# Patient Record
Sex: Female | Born: 1945
Health system: Southern US, Community
[De-identification: ages and names within clinical notes are randomized; demographics above are authoritative.]

## PROBLEM LIST (undated history)

## (undated) ENCOUNTER — Emergency Department (HOSPITAL_COMMUNITY)

## (undated) ENCOUNTER — Ambulatory Visit (HOSPITAL_COMMUNITY): Admission: EM | Payer: Medicare Other | Source: Home / Self Care

## (undated) DIAGNOSIS — M810 Age-related osteoporosis without current pathological fracture: Secondary | ICD-10-CM

## (undated) DIAGNOSIS — T4145XA Adverse effect of unspecified anesthetic, initial encounter: Secondary | ICD-10-CM

## (undated) DIAGNOSIS — C801 Malignant (primary) neoplasm, unspecified: Secondary | ICD-10-CM

## (undated) DIAGNOSIS — G4730 Sleep apnea, unspecified: Secondary | ICD-10-CM

## (undated) DIAGNOSIS — M1990 Unspecified osteoarthritis, unspecified site: Secondary | ICD-10-CM

## (undated) DIAGNOSIS — H409 Unspecified glaucoma: Secondary | ICD-10-CM

## (undated) DIAGNOSIS — A6000 Herpesviral infection of urogenital system, unspecified: Secondary | ICD-10-CM

## (undated) DIAGNOSIS — I10 Essential (primary) hypertension: Secondary | ICD-10-CM

## (undated) DIAGNOSIS — T8859XA Other complications of anesthesia, initial encounter: Secondary | ICD-10-CM

## (undated) DIAGNOSIS — I2510 Atherosclerotic heart disease of native coronary artery without angina pectoris: Secondary | ICD-10-CM

## (undated) DIAGNOSIS — H9190 Unspecified hearing loss, unspecified ear: Secondary | ICD-10-CM

## (undated) HISTORY — PX: MOUTH SURGERY: SHX715

## (undated) HISTORY — PX: OTHER SURGICAL HISTORY: SHX169

## (undated) HISTORY — DX: Sleep apnea, unspecified: G47.30

## (undated) HISTORY — PX: TUBAL LIGATION: SHX77

---
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
## 1998-06-09 ENCOUNTER — Other Ambulatory Visit: Admission: RE | Admit: 1998-06-09 | Discharge: 1998-06-09 | Payer: Self-pay | Admitting: Family Medicine

## 1998-10-21 ENCOUNTER — Encounter: Payer: Self-pay | Admitting: Family Medicine

## 1998-10-21 ENCOUNTER — Ambulatory Visit (HOSPITAL_COMMUNITY): Admission: RE | Admit: 1998-10-21 | Discharge: 1998-10-21 | Payer: Self-pay | Admitting: Family Medicine

## 1999-01-31 ENCOUNTER — Encounter: Payer: Self-pay | Admitting: Emergency Medicine

## 1999-01-31 ENCOUNTER — Inpatient Hospital Stay (HOSPITAL_COMMUNITY): Admission: EM | Admit: 1999-01-31 | Discharge: 1999-02-04 | Payer: Self-pay | Admitting: Emergency Medicine

## 1999-02-03 ENCOUNTER — Encounter: Payer: Self-pay | Admitting: Cardiovascular Disease

## 1999-02-11 ENCOUNTER — Emergency Department (HOSPITAL_COMMUNITY): Admission: EM | Admit: 1999-02-11 | Discharge: 1999-02-11 | Payer: Self-pay | Admitting: Emergency Medicine

## 1999-02-12 ENCOUNTER — Emergency Department (HOSPITAL_COMMUNITY): Admission: EM | Admit: 1999-02-12 | Discharge: 1999-02-12 | Payer: Self-pay | Admitting: Emergency Medicine

## 1999-02-20 ENCOUNTER — Emergency Department (HOSPITAL_COMMUNITY): Admission: EM | Admit: 1999-02-20 | Discharge: 1999-02-20 | Payer: Self-pay | Admitting: Emergency Medicine

## 2000-05-07 ENCOUNTER — Emergency Department (HOSPITAL_COMMUNITY): Admission: EM | Admit: 2000-05-07 | Discharge: 2000-05-07 | Payer: Self-pay | Admitting: Emergency Medicine

## 2000-05-14 ENCOUNTER — Emergency Department (HOSPITAL_COMMUNITY): Admission: EM | Admit: 2000-05-14 | Discharge: 2000-05-14 | Payer: Self-pay | Admitting: Emergency Medicine

## 2000-06-16 ENCOUNTER — Ambulatory Visit (HOSPITAL_COMMUNITY): Admission: RE | Admit: 2000-06-16 | Discharge: 2000-06-16 | Payer: Self-pay | Admitting: Family Medicine

## 2000-06-16 ENCOUNTER — Encounter: Payer: Self-pay | Admitting: Family Medicine

## 2000-06-25 ENCOUNTER — Emergency Department (HOSPITAL_COMMUNITY): Admission: EM | Admit: 2000-06-25 | Discharge: 2000-06-25 | Payer: Self-pay

## 2000-09-16 ENCOUNTER — Encounter: Payer: Self-pay | Admitting: Family Medicine

## 2000-09-16 ENCOUNTER — Ambulatory Visit (HOSPITAL_COMMUNITY): Admission: RE | Admit: 2000-09-16 | Discharge: 2000-09-16 | Payer: Self-pay | Admitting: Family Medicine

## 2001-09-15 ENCOUNTER — Other Ambulatory Visit: Admission: RE | Admit: 2001-09-15 | Discharge: 2001-09-15 | Payer: Self-pay | Admitting: Anesthesiology

## 2001-09-15 ENCOUNTER — Other Ambulatory Visit: Admission: RE | Admit: 2001-09-15 | Discharge: 2001-09-15 | Payer: Self-pay | Admitting: Family Medicine

## 2005-01-18 ENCOUNTER — Ambulatory Visit (HOSPITAL_COMMUNITY): Admission: RE | Admit: 2005-01-18 | Discharge: 2005-01-18 | Payer: Self-pay | Admitting: Family Medicine

## 2009-07-18 ENCOUNTER — Encounter: Admission: RE | Admit: 2009-07-18 | Discharge: 2009-07-18 | Payer: Self-pay | Admitting: Family Medicine

## 2009-07-25 ENCOUNTER — Encounter: Admission: RE | Admit: 2009-07-25 | Discharge: 2009-07-25 | Payer: Self-pay | Admitting: Family Medicine

## 2009-10-19 ENCOUNTER — Emergency Department (HOSPITAL_COMMUNITY): Admission: EM | Admit: 2009-10-19 | Discharge: 2009-10-19 | Payer: Self-pay | Admitting: Family Medicine

## 2010-02-21 ENCOUNTER — Emergency Department (HOSPITAL_COMMUNITY): Admission: EM | Admit: 2010-02-21 | Discharge: 2010-02-21 | Payer: Self-pay | Admitting: Emergency Medicine

## 2010-09-07 ENCOUNTER — Other Ambulatory Visit (HOSPITAL_COMMUNITY): Payer: Self-pay | Admitting: Family Medicine

## 2010-09-07 DIAGNOSIS — Z1231 Encounter for screening mammogram for malignant neoplasm of breast: Secondary | ICD-10-CM

## 2010-09-07 DIAGNOSIS — Z139 Encounter for screening, unspecified: Secondary | ICD-10-CM

## 2010-09-25 ENCOUNTER — Ambulatory Visit (HOSPITAL_COMMUNITY)
Admission: RE | Admit: 2010-09-25 | Discharge: 2010-09-25 | Disposition: A | Discharge status: Home / Self Care | Payer: Medicare Other | Source: Ambulatory Visit | Attending: Family Medicine | Admitting: Family Medicine

## 2010-09-25 DIAGNOSIS — Z1231 Encounter for screening mammogram for malignant neoplasm of breast: Secondary | ICD-10-CM | POA: Insufficient documentation

## 2011-05-15 ENCOUNTER — Emergency Department (HOSPITAL_COMMUNITY)
Admission: EM | Admit: 2011-05-15 | Discharge: 2011-05-15 | Disposition: A | Discharge status: Home / Self Care | Payer: Medicare Other | Attending: Emergency Medicine | Admitting: Emergency Medicine

## 2011-05-15 DIAGNOSIS — R220 Localized swelling, mass and lump, head: Secondary | ICD-10-CM | POA: Insufficient documentation

## 2011-05-15 DIAGNOSIS — R0989 Other specified symptoms and signs involving the circulatory and respiratory systems: Secondary | ICD-10-CM | POA: Insufficient documentation

## 2011-05-15 DIAGNOSIS — R0602 Shortness of breath: Secondary | ICD-10-CM | POA: Insufficient documentation

## 2011-05-15 DIAGNOSIS — I519 Heart disease, unspecified: Secondary | ICD-10-CM | POA: Insufficient documentation

## 2011-05-15 DIAGNOSIS — I10 Essential (primary) hypertension: Secondary | ICD-10-CM | POA: Insufficient documentation

## 2011-05-15 DIAGNOSIS — T781XXA Other adverse food reactions, not elsewhere classified, initial encounter: Secondary | ICD-10-CM | POA: Insufficient documentation

## 2011-05-15 DIAGNOSIS — H409 Unspecified glaucoma: Secondary | ICD-10-CM | POA: Insufficient documentation

## 2011-05-15 DIAGNOSIS — Z79899 Other long term (current) drug therapy: Secondary | ICD-10-CM | POA: Insufficient documentation

## 2011-05-15 DIAGNOSIS — R0609 Other forms of dyspnea: Secondary | ICD-10-CM | POA: Insufficient documentation

## 2012-01-21 ENCOUNTER — Other Ambulatory Visit (HOSPITAL_COMMUNITY): Payer: Self-pay | Admitting: Obstetrics and Gynecology

## 2012-01-21 DIAGNOSIS — Z780 Asymptomatic menopausal state: Secondary | ICD-10-CM

## 2012-01-21 DIAGNOSIS — Z1231 Encounter for screening mammogram for malignant neoplasm of breast: Secondary | ICD-10-CM

## 2012-04-06 ENCOUNTER — Ambulatory Visit (HOSPITAL_COMMUNITY)
Admission: RE | Admit: 2012-04-06 | Discharge: 2012-04-06 | Disposition: A | Discharge status: Home / Self Care | Payer: Medicare Other | Source: Ambulatory Visit | Attending: Obstetrics and Gynecology | Admitting: Obstetrics and Gynecology

## 2012-04-06 DIAGNOSIS — Z780 Asymptomatic menopausal state: Secondary | ICD-10-CM | POA: Insufficient documentation

## 2012-04-06 DIAGNOSIS — Z1231 Encounter for screening mammogram for malignant neoplasm of breast: Secondary | ICD-10-CM | POA: Insufficient documentation

## 2012-04-06 DIAGNOSIS — Z13820 Encounter for screening for osteoporosis: Secondary | ICD-10-CM | POA: Insufficient documentation

## 2012-11-01 ENCOUNTER — Emergency Department (INDEPENDENT_AMBULATORY_CARE_PROVIDER_SITE_OTHER): Payer: Medicare Other

## 2012-11-01 ENCOUNTER — Encounter (HOSPITAL_COMMUNITY): Payer: Self-pay | Admitting: Emergency Medicine

## 2012-11-01 ENCOUNTER — Emergency Department (INDEPENDENT_AMBULATORY_CARE_PROVIDER_SITE_OTHER)
Admission: EM | Admit: 2012-11-01 | Discharge: 2012-11-01 | Disposition: A | Discharge status: Home / Self Care | Payer: Medicare Other | Source: Home / Self Care | Attending: Family Medicine | Admitting: Family Medicine

## 2012-11-01 DIAGNOSIS — R109 Unspecified abdominal pain: Secondary | ICD-10-CM

## 2012-11-01 DIAGNOSIS — S76219A Strain of adductor muscle, fascia and tendon of unspecified thigh, initial encounter: Secondary | ICD-10-CM

## 2012-11-01 DIAGNOSIS — IMO0002 Reserved for concepts with insufficient information to code with codable children: Secondary | ICD-10-CM

## 2012-11-01 DIAGNOSIS — R1032 Left lower quadrant pain: Secondary | ICD-10-CM

## 2012-11-01 HISTORY — DX: Essential (primary) hypertension: I10

## 2012-11-01 HISTORY — DX: Unspecified glaucoma: H40.9

## 2012-11-01 HISTORY — DX: Herpesviral infection of urogenital system, unspecified: A60.00

## 2012-11-01 LAB — POCT URINALYSIS DIP (DEVICE)
Hgb urine dipstick: NEGATIVE
Nitrite: NEGATIVE
Protein, ur: NEGATIVE mg/dL
Urobilinogen, UA: 0.2 mg/dL (ref 0.0–1.0)
pH: 7.5 (ref 5.0–8.0)

## 2012-11-01 MED ORDER — VALACYCLOVIR HCL 500 MG PO TABS: 500.0000 mg | ORAL_TABLET | Freq: Two times a day (BID) | ORAL | Status: AC

## 2012-11-01 MED ORDER — CYCLOBENZAPRINE HCL 10 MG PO TABS: 10.0000 mg | ORAL_TABLET | Freq: Every evening | ORAL | Status: DC | PRN

## 2012-11-01 MED ORDER — HYDROCODONE-ACETAMINOPHEN 5-325 MG PO TABS: 2.0000 | ORAL_TABLET | Freq: Three times a day (TID) | ORAL | Status: DC | PRN

## 2012-11-01 NOTE — ED Notes (Signed)
Woke with sharp pain "in side", left groin pain.  Denies urinary problems, denies bowel issues.  Pain onset Sunday am.  (3/16).  Pain is now dull.  Pain is not impacted by any behavior, pain is there and pain hurts

## 2012-11-01 NOTE — ED Notes (Signed)
Patient instructed to undress for physician exam, given patient gown and sheet

## 2012-11-01 NOTE — ED Notes (Signed)
Offered beverage, patient declined

## 2012-11-01 NOTE — ED Provider Notes (Signed)
History     CSN: [id number]  Arrival date & time 11/01/12  1013   First MD Initiated Contact with Patient 11/01/12 1025      Chief Complaint  Patient presents with  . Groin Pain    (Consider location/radiation/quality/duration/timing/severity/associated sxs/prior treatment) HPI Comments: 67 year old female with history of hypertension, glaucoma and genital herpes. Here complaining of left groin pain for 3 days. Patient states that the first time she felt pain was very urgent in the morning and woke her up. Pain is constant patient states that was sharp initially and now is dull and less. Patient denies abdominal pain nausea or vomiting associated. She has been having some how "loose stools" once or twice a day with no blood or mucus for over a week and she attributes this as died changes. She explains she is a vegetarian and her diet has changed recently do to stress and "craving a lot of carbohydrates" has been eating a lot of punkin bread and soups during the last few weeks. Appetite has not changed.  Patient also reports history of genital herpes that started 3 years ago and states that she had recurrent genital herpes outbreaks for 2 months that improved after she started taking oral lysine once a day last month and has not had another outbreak. She denies having a current genital rash. Denies fever or chills. No headache or dizziness. She is otherwise feeling well. Patient also states she slipped in the ice and fell sitting on her back the day before her pain started. She didn't think she was injured as did not experience pain right after her fall.    Past Medical History  Diagnosis Date  . Hypertension   . Glaucoma   . Genital herpes     Past Surgical History  Procedure Laterality Date  . Cesarean section    . Coronary catherization      No family history on file.  History  Substance Use Topics  . Smoking status: Never Smoker   . Smokeless tobacco: Not on file  . Alcohol  Use: No    OB History   Grav Para Term Preterm Abortions TAB SAB Ect Mult Living                  Review of Systems  Constitutional: Negative for fever, chills, activity change, appetite change and fatigue.  Gastrointestinal: Negative for nausea, vomiting, abdominal pain and constipation.  Genitourinary: Negative for dysuria, hematuria and flank pain.  Skin: Negative for rash.  All other systems reviewed and are negative.    Allergies  Fish allergy; Latex; Lodine; Milk-related compounds; Norvasc; Penicillins; and Sulfur  Home Medications   Current Outpatient Rx  Name  Route  Sig  Dispense  Refill  . aspirin 81 MG tablet   Oral   Take 81 mg by mouth daily.         . bimatoprost (LUMIGAN) 0.01 % SOLN      1 drop at bedtime.         . brimonidine (ALPHAGAN P) 0.1 % SOLN               . ibuprofen (ADVIL,MOTRIN) 200 MG tablet   Oral   Take 200 mg by mouth every 6 (six) hours as needed for pain.         Marland Kitchen OVER THE COUNTER MEDICATION      lysene?         . triamterene-hydrochlorothiazide (MAXZIDE-25) 37.5-25 MG per tablet   Oral  Take 1 tablet by mouth daily.         . cyclobenzaprine (FLEXERIL) 10 MG tablet   Oral   Take 1 tablet (10 mg total) by mouth at bedtime as needed for muscle spasms.   20 tablet   0   . HYDROcodone-acetaminophen (NORCO/VICODIN) 5-325 MG per tablet   Oral   Take 2 tablets by mouth every 8 (eight) hours as needed for pain.   10 tablet   0   . valACYclovir (VALTREX) 500 MG tablet   Oral   Take 1 tablet (500 mg total) by mouth 2 (two) times daily. For 3 days   6 tablet   2     BP 152/65  Pulse 60  Temp(Src) 97.7 F (36.5 C) (Oral)  Resp 18  SpO2 100%  Physical Exam  Nursing note and vitals reviewed. Constitutional: She is oriented to person, place, and time. She appears well-developed and well-nourished. No distress.  HENT:  Head: Normocephalic and atraumatic.  Cardiovascular: Normal heart sounds.    Pulmonary/Chest: Breath sounds normal.  Abdominal: Soft. Bowel sounds are normal. She exhibits no distension and no mass. There is no tenderness. There is no rebound and no guarding.  Genitourinary:  There is tenderness to deep palpation in the left groin area. No mass or obvious large lymphadenopathy. No obvious femoral hernia. Femoral pulses normal.  Musculoskeletal:  Central spine with no scoliosis or kyphosis. Fair anterior flexion and posterior extension. Patient able to walk on tip toes and heels with no difficulty foot drop or pain exacerbation. No bone prominence tenderness. Tenderness to palpation in bilateral lumbar paravertebral muscles.  Negative straight leg test bilateral. Intact sensation and symmetric + DTRs (rotullian and achillean) in low extremities. Left hip: Fair external and internal rotation. No erythema or swelling. Reported pain with movement. Patient is weightbearing with no limp.   Lymphadenopathy:    She has no cervical adenopathy.  Neurological: She is alert and oriented to person, place, and time.  Skin: No rash noted. She is not diaphoretic.    ED Course  Procedures (including critical care time)  Labs Reviewed  POCT URINALYSIS DIP (DEVICE)   Dg Hip Complete Left  11/01/2012  *RADIOLOGY REPORT*  Clinical Data: Fall.  Left hip pain.  LEFT HIP - COMPLETE 2+ VIEW  Comparison: None.  Findings: There is no fracture or dislocation.  Joint spaces are preserved.  No plain film evidence of avascular necrosis. Surrounding osseous and soft tissue structures are unremarkable.  IMPRESSION: Negative exam.   Original Report Authenticated By: Holley Dexter, M.D.    Dg Abd 1 View  11/01/2012  *RADIOLOGY REPORT*  Clinical Data: Left lower quadrant abdominal pain.  Groin pain.  ABDOMEN - 1 VIEW  Comparison: No priors.  Findings: Gas and stool are seen scattered throughout the colon extending to the level of the distal rectum.  No pathologic distension of small bowel is  noted.  No gross evidence of pneumoperitoneum.  Numerous pelvic phleboliths are incidentally noted.  IMPRESSION: 1.  Nonobstructive bowel gas pattern. 2.  No pneumoperitoneum.   Original Report Authenticated By: Trudie Reed, M.D.      1. Deep groin pain, left   2. Back strain, initial encounter   3. Groin strain, initial encounter       MDM  67 year old female with history of genital herpes and frequent recurrences recently. Here complaining of left groin pain. Recent fall. Normal examination other than reported pain with deep palpation in the left groin. No  clinical findings or radiologic findings suggestive of bowel obstruction. No significant gastrointestinal symptoms No obvious femoral hernia or findings suggestive of acute abdomen. No hip fractures on x-rays. My impression is that groin pain is likely related to deep tender lymph nodes in relation to herpes. Prescribed Valtrex to use as needed. Also low back strain. Prescribed Flexeril and Norco. Supportive care and red flags should prompt her return to medical attention discussed with patient and provided in writing.        Sharin Grave, MD 11/02/12 1754

## 2013-02-22 ENCOUNTER — Encounter (HOSPITAL_COMMUNITY): Payer: Self-pay | Admitting: Emergency Medicine

## 2013-02-22 ENCOUNTER — Other Ambulatory Visit: Payer: Self-pay

## 2013-02-22 ENCOUNTER — Emergency Department (INDEPENDENT_AMBULATORY_CARE_PROVIDER_SITE_OTHER)
Admission: EM | Admit: 2013-02-22 | Discharge: 2013-02-22 | Disposition: A | Discharge status: Home / Self Care | Payer: Medicare Other | Source: Home / Self Care

## 2013-02-22 DIAGNOSIS — R200 Anesthesia of skin: Secondary | ICD-10-CM

## 2013-02-22 DIAGNOSIS — E871 Hypo-osmolality and hyponatremia: Secondary | ICD-10-CM

## 2013-02-22 DIAGNOSIS — E860 Dehydration: Secondary | ICD-10-CM

## 2013-02-22 LAB — POCT I-STAT, CHEM 8
Glucose, Bld: 97 mg/dL (ref 70–99)
HCT: 36 % (ref 36.0–46.0)
Hemoglobin: 12.2 g/dL (ref 12.0–15.0)
Potassium: 3.8 mEq/L (ref 3.5–5.1)
Sodium: 130 mEq/L — ABNORMAL LOW (ref 135–145)
TCO2: 26 mmol/L (ref 0–100)

## 2013-02-22 LAB — URINALYSIS, ROUTINE W REFLEX MICROSCOPIC
Glucose, UA: NEGATIVE mg/dL
Hgb urine dipstick: NEGATIVE
Ketones, ur: NEGATIVE mg/dL
Protein, ur: NEGATIVE mg/dL
Urobilinogen, UA: 0.2 mg/dL (ref 0.0–1.0)

## 2013-02-22 LAB — URINE MICROSCOPIC-ADD ON

## 2013-02-22 LAB — CBC WITH DIFFERENTIAL/PLATELET
Basophils Relative: 0 % (ref 0–1)
Eosinophils Absolute: 0.1 10*3/uL (ref 0.0–0.7)
MCH: 30.1 pg (ref 26.0–34.0)
MCHC: 34.3 g/dL (ref 30.0–36.0)
Neutrophils Relative %: 60 % (ref 43–77)
Platelets: 190 10*3/uL (ref 150–400)

## 2013-02-22 LAB — POCT URINALYSIS DIP (DEVICE)
Bilirubin Urine: NEGATIVE
Ketones, ur: NEGATIVE mg/dL
Nitrite: NEGATIVE
Protein, ur: NEGATIVE mg/dL

## 2013-02-22 MED ORDER — SODIUM CHLORIDE 0.9 % IV BOLUS (SEPSIS)
1000.0000 mL | Freq: Once | INTRAVENOUS | Status: AC
  Administered 2013-02-22: 1000 mL via INTRAVENOUS

## 2013-02-22 MED ORDER — ONDANSETRON 4 MG PO TBDP
ORAL_TABLET | ORAL | Status: AC
  Filled 2013-02-22: qty 1

## 2013-02-22 MED ORDER — ONDANSETRON 4 MG PO TBDP
4.0000 mg | ORAL_TABLET | Freq: Once | ORAL | Status: AC
  Administered 2013-02-22: 4 mg via ORAL

## 2013-02-22 NOTE — ED Notes (Addendum)
Patient c/o leg cramping onset this morning in her calf muscle. Woke her up from sleeping. Pain was very intense, she tried drinking large cups of water with no relief. Felt dizzy and like her heart was racing. Pt also c/o right forearm swelling due to a possible bug bite. Felt nauseous this morning. Also has a headache now.  Patient is fasting for Ramadan, for about 16 hrs a day. Patient is alert and oriented.

## 2013-02-22 NOTE — ED Provider Notes (Signed)
History    CSN: [id number] Arrival date & time 02/22/13  1002  None    Chief Complaint  Patient presents with  . Leg Pain  . Insect Bite   (Consider location/radiation/quality/duration/timing/severity/associated sxs/prior Treatment) HPI Comments: 67 year old female presents complaining of leg cramping, dizziness, palpitations, nausea, headache. This began this morning at about 2 AM with the cramping in her left calf. She has had cramping before exactly like this but it usually goes away after she drinks some water. She has been drinking a lot of water in the calf cramp has somewhat subsided but he keeps feeling like it's going to come back. The palpitations have been happening frequently since about 2:00 this morning as well. She has had fluttering in her chest similar to this before but never this frequently. She does not have any symptoms associated with a fluttering including change in her dizziness, shortness of breath, chest pain, nausea, and diaphoresis. The dizziness she describes as a slight lightheaded feeling but has been happening since about 7:00 this morning. It is not associated with any change in temperature and did not begin with any exertion. She occasionally has symptoms similar to this with sinus irritation but she is not currently experiencing any sinus problems. The nausea and headache she felt for just a couple of minutes this morning, these have now both resolved. She also had a area of swelling on her right distal medial forearm just proximal to the wrist where she thinks she may have been bitten by a bug, but this is now resolving without any treatment. She notes that she is now fasting for 16 hours per day for Ramadan and she was out in the yard a lot yesterday and feels like she may be dehydrated.  Patient is a 67 y.o. female presenting with leg pain.  Leg Pain Associated symptoms: no fever and no neck pain    Past Medical History  Diagnosis Date  . Hypertension   .  Glaucoma   . Genital herpes    Past Surgical History  Procedure Laterality Date  . Cesarean section    . Coronary catherization     No family history on file. History  Substance Use Topics  . Smoking status: Never Smoker   . Smokeless tobacco: Not on file  . Alcohol Use: No   OB History   Grav Para Term Preterm Abortions TAB SAB Ect Mult Living                 Review of Systems  Constitutional: Negative for fever and chills.  HENT: Negative for ear pain, congestion, sore throat, facial swelling, rhinorrhea, neck pain, postnasal drip and sinus pressure.   Eyes: Negative for visual disturbance.  Respiratory: Negative for cough and shortness of breath.   Cardiovascular: Positive for palpitations. Negative for chest pain and leg swelling.  Gastrointestinal: Positive for nausea. Negative for vomiting and abdominal pain.  Endocrine: Negative for polydipsia and polyuria.  Genitourinary: Negative for dysuria, urgency and frequency.  Musculoskeletal: Positive for myalgias (cramping - see HPI). Negative for arthralgias.  Skin: Negative for rash.  Neurological: Positive for light-headedness and headaches. Negative for dizziness and weakness.    Allergies  Fish allergy; Latex; Lodine; Milk-related compounds; Norvasc; Penicillins; and Sulfur  Home Medications   Current Outpatient Rx  Name  Route  Sig  Dispense  Refill  . bimatoprost (LUMIGAN) 0.01 % SOLN      1 drop at bedtime.         Marland Kitchen  brimonidine (ALPHAGAN P) 0.1 % SOLN               . OVER THE COUNTER MEDICATION      lysene?         . triamterene-hydrochlorothiazide (MAXZIDE-25) 37.5-25 MG per tablet   Oral   Take 1 tablet by mouth daily.         Marland Kitchen aspirin 81 MG tablet   Oral   Take 81 mg by mouth daily.         . cyclobenzaprine (FLEXERIL) 10 MG tablet   Oral   Take 1 tablet (10 mg total) by mouth at bedtime as needed for muscle spasms.   20 tablet   0   . HYDROcodone-acetaminophen (NORCO/VICODIN)  5-325 MG per tablet   Oral   Take 2 tablets by mouth every 8 (eight) hours as needed for pain.   10 tablet   0   . ibuprofen (ADVIL,MOTRIN) 200 MG tablet   Oral   Take 200 mg by mouth every 6 (six) hours as needed for pain.          BP 163/79  Pulse 56  Temp(Src) 98.6 F (37 C) (Oral)  Resp 14  SpO2 100% Physical Exam  Nursing note and vitals reviewed. Constitutional: She is oriented to person, place, and time. Vital signs are normal. She appears well-developed and well-nourished. No distress.  HENT:  Head: Atraumatic.  Eyes: EOM are normal. Pupils are equal, round, and reactive to light.  Cardiovascular: Normal rate, regular rhythm, normal heart sounds, intact distal pulses and normal pulses.   No extrasystoles are present. Exam reveals no gallop and no friction rub.   No murmur heard. Negative thompson test, pain with passive dorsiflexion, edema, or decreased pedal pulses   Pulmonary/Chest: Effort normal and breath sounds normal. No respiratory distress. She has no wheezes. She has no rales.  Abdominal: Soft. There is no tenderness.  Musculoskeletal:       Right forearm: She exhibits swelling (at distal ulnar side of her arm, no induration, no tenderness, no swelling in the arm distal to this.).       Left lower leg: She exhibits no tenderness, no bony tenderness, no swelling, no edema and no deformity.  Neurological: She is alert and oriented to person, place, and time. She has normal strength.  Skin: Skin is warm and dry. She is not diaphoretic.  Psychiatric: She has a normal mood and affect. Her behavior is normal. Judgment normal.    ED Course  Procedures (including critical care time) Labs Reviewed  CBC WITH DIFFERENTIAL - Abnormal; Notable for the following:    RBC 3.75 (*)    Hemoglobin 11.3 (*)    HCT 32.9 (*)    All other components within normal limits  URINALYSIS, ROUTINE W REFLEX MICROSCOPIC - Abnormal; Notable for the following:    Leukocytes, UA SMALL  (*)    All other components within normal limits  POCT I-STAT, CHEM 8 - Abnormal; Notable for the following:    Sodium 130 (*)    Chloride 93 (*)    All other components within normal limits  POCT URINALYSIS DIP (DEVICE) - Abnormal; Notable for the following:    Hgb urine dipstick TRACE (*)    Leukocytes, UA TRACE (*)    All other components within normal limits  URINE CULTURE  URINE MICROSCOPIC-ADD ON   No results found. 1. Dehydration   2. Hyponatremia     EKG is normal. She has mild  anemia, also mild hyponatremia and hypochloremia. Other than the anemia, this is a picture of dehydration. With hemoconcentration, the anemia is probably more pronounced than it seems to be today with the CBC.  Will give 1 liter bolus of 0.9% NaCl and reassess her at that point.    After 1 L of normal saline, she feels much better and all symptoms have resolved. MDM  All of the symptoms have most likely been brought on by dehydration. I urged her to stop fasting throughout the day to maintain a normal oral intake of fluids. If her symptoms return or she experiences any new symptoms she will followup with her primary care physician, here, or in the emergency department.   Meds ordered this encounter  Medications  . sodium chloride 0.9 % bolus 1,000 mL    Sig:   . ondansetron (ZOFRAN-ODT) disintegrating tablet 4 mg    Sig:      Graylon Good, PA-C 02/22/13 2104

## 2013-02-23 NOTE — ED Provider Notes (Signed)
Medical screening examination/treatment/procedure(s) were performed by non-physician practitioner and as supervising physician I was immediately available for consultation/collaboration.   [facility], [place]; MD  Sharin Grave, MD 02/23/13 [phone]

## 2013-02-24 LAB — URINE CULTURE: Colony Count: 40000

## 2013-03-09 ENCOUNTER — Inpatient Hospital Stay (HOSPITAL_COMMUNITY)
Admission: EM | Admit: 2013-03-09 | Discharge: 2013-03-11 | DRG: 103 | Disposition: A | Discharge status: Home / Self Care | Payer: Medicare Other | Attending: Internal Medicine | Admitting: Internal Medicine

## 2013-03-09 ENCOUNTER — Emergency Department (HOSPITAL_COMMUNITY): Payer: Medicare Other

## 2013-03-09 ENCOUNTER — Encounter (HOSPITAL_COMMUNITY): Payer: Self-pay

## 2013-03-09 DIAGNOSIS — I6350 Cerebral infarction due to unspecified occlusion or stenosis of unspecified cerebral artery: Secondary | ICD-10-CM

## 2013-03-09 DIAGNOSIS — I639 Cerebral infarction, unspecified: Secondary | ICD-10-CM

## 2013-03-09 DIAGNOSIS — E876 Hypokalemia: Secondary | ICD-10-CM | POA: Diagnosis present

## 2013-03-09 DIAGNOSIS — I10 Essential (primary) hypertension: Secondary | ICD-10-CM | POA: Diagnosis present

## 2013-03-09 DIAGNOSIS — R079 Chest pain, unspecified: Secondary | ICD-10-CM | POA: Diagnosis present

## 2013-03-09 DIAGNOSIS — R209 Unspecified disturbances of skin sensation: Secondary | ICD-10-CM | POA: Diagnosis present

## 2013-03-09 DIAGNOSIS — Z79899 Other long term (current) drug therapy: Secondary | ICD-10-CM

## 2013-03-09 DIAGNOSIS — R51 Headache: Principal | ICD-10-CM | POA: Diagnosis present

## 2013-03-09 DIAGNOSIS — Z23 Encounter for immunization: Secondary | ICD-10-CM

## 2013-03-09 DIAGNOSIS — Z7982 Long term (current) use of aspirin: Secondary | ICD-10-CM

## 2013-03-09 DIAGNOSIS — R200 Anesthesia of skin: Secondary | ICD-10-CM | POA: Diagnosis present

## 2013-03-09 DIAGNOSIS — H409 Unspecified glaucoma: Secondary | ICD-10-CM | POA: Diagnosis present

## 2013-03-09 LAB — POCT I-STAT, CHEM 8
Glucose, Bld: 96 mg/dL (ref 70–99)
HCT: 35 % — ABNORMAL LOW (ref 36.0–46.0)
Hemoglobin: 11.9 g/dL — ABNORMAL LOW (ref 12.0–15.0)
Potassium: 3.1 mEq/L — ABNORMAL LOW (ref 3.5–5.1)
Sodium: 133 mEq/L — ABNORMAL LOW (ref 135–145)

## 2013-03-09 LAB — CBC WITH DIFFERENTIAL/PLATELET
Basophils Absolute: 0 10*3/uL (ref 0.0–0.1)
Basophils Relative: 1 % (ref 0–1)
Eosinophils Absolute: 0.1 10*3/uL (ref 0.0–0.7)
MCH: 29.9 pg (ref 26.0–34.0)
MCHC: 33.5 g/dL (ref 30.0–36.0)
Neutrophils Relative %: 60 % (ref 43–77)
Platelets: 230 10*3/uL (ref 150–400)
RDW: 14.1 % (ref 11.5–15.5)

## 2013-03-09 MED ORDER — ASPIRIN 81 MG PO CHEW
324.0000 mg | CHEWABLE_TABLET | Freq: Once | ORAL | Status: AC
  Administered 2013-03-10: 324 mg via ORAL
  Filled 2013-03-09: qty 4

## 2013-03-09 MED ORDER — SODIUM CHLORIDE 0.9 % IV BOLUS (SEPSIS)
500.0000 mL | Freq: Once | INTRAVENOUS | Status: AC
  Administered 2013-03-09: 500 mL via INTRAVENOUS

## 2013-03-09 NOTE — ED Notes (Signed)
MD at bedside. 

## 2013-03-09 NOTE — ED Provider Notes (Addendum)
CSN: [id number]     Arrival date & time 03/09/13  1943 History     First MD Initiated Contact with Patient 03/09/13 2005     Chief Complaint  Patient presents with  . Leg Numbness    (Consider location/radiation/quality/duration/timing/severity/associated sxs/prior Treatment) HPI Comments: Patient presents with headache and left leg numbness. She states that about 2 hours ago she had onset of a right-sided headache that spread to the left side. This was associated with numbness in her left foot this spread to her left leg. She denies any numbness or weakness of her arm. She denies any weakness of her legs. She did have some associated dizziness and confusion with the symptoms. She states that she forgot how to drive to the hospital. She subsequently called EMS and was brought in by EMS. She denies any slurred speech or facial numbness. She denies any vision changes. She denies any history of stroke symptoms in the past. She has had some intermittent dizziness in the past but states this is different. She did have an episode of chest pain to the left side of her chest earlier this afternoon. There is no associated shortness of breath. She's had similar episodes of chest pain intermittently over the last several months.   Past Medical History  Diagnosis Date  . Hypertension   . Glaucoma   . Genital herpes    Past Surgical History  Procedure Laterality Date  . Cesarean section    . Coronary catherization     History reviewed. No pertinent family history. History  Substance Use Topics  . Smoking status: Never Smoker   . Smokeless tobacco: Not on file  . Alcohol Use: No   OB History   Grav Para Term Preterm Abortions TAB SAB Ect Mult Living                 Review of Systems  Constitutional: Negative for fever, chills, diaphoresis and fatigue.  HENT: Negative for congestion, rhinorrhea and sneezing.   Eyes: Negative.   Respiratory: Negative for cough, chest tightness and shortness  of breath.   Cardiovascular: Positive for chest pain. Negative for leg swelling.  Gastrointestinal: Negative for nausea, vomiting, abdominal pain, diarrhea and blood in stool.  Genitourinary: Negative for frequency, hematuria, flank pain and difficulty urinating.  Musculoskeletal: Negative for back pain and arthralgias.  Skin: Negative for rash.  Neurological: Positive for dizziness, numbness and headaches. Negative for tremors, speech difficulty and weakness.    Allergies  Fish allergy; Latex; Lodine; Milk-related compounds; Norvasc; Penicillins; and Sulfur  Home Medications   Current Outpatient Rx  Name  Route  Sig  Dispense  Refill  . brimonidine (ALPHAGAN) 0.2 % ophthalmic solution   Both Eyes   Place 1 drop into both eyes 3 (three) times daily.         . calcium carbonate (OS-CAL) 1250 MG chewable tablet   Oral   Chew 2 tablets by mouth 2 (two) times daily.         . CHELATED IRON PO   Oral   Take 1 tablet by mouth daily.         Marland Kitchen latanoprost (XALATAN) 0.005 % ophthalmic solution   Both Eyes   Place 1 drop into both eyes at bedtime.         . triamterene-hydrochlorothiazide (MAXZIDE-25) 37.5-25 MG per tablet   Oral   Take 1 tablet by mouth daily.         . Vitamin D, Ergocalciferol, (DRISDOL) 50000  UNITS CAPS   Oral   Take 5,000 Units by mouth daily.          BP 151/81  Pulse 61  Temp(Src) 98 F (36.7 C) (Oral)  Resp 20  SpO2 95% Physical Exam  Constitutional: She is oriented to person, place, and time. She appears well-developed and well-nourished.  HENT:  Head: Normocephalic and atraumatic.  Eyes: Pupils are equal, round, and reactive to light.  Neck: Normal range of motion. Neck supple.  Cardiovascular: Normal rate, regular rhythm and normal heart sounds.   Pulmonary/Chest: Effort normal and breath sounds normal. No respiratory distress. She has no wheezes. She has no rales. She exhibits no tenderness.  Abdominal: Soft. Bowel sounds are  normal. There is no tenderness. There is no rebound and no guarding.  Musculoskeletal: Normal range of motion. She exhibits no edema.  Lymphadenopathy:    She has no cervical adenopathy.  Neurological: She is alert and oriented to person, place, and time. She has normal strength. A sensory deficit is present. No cranial nerve deficit. GCS eye subscore is 4. GCS verbal subscore is 5. GCS motor subscore is 6.  Mild numbness to light touch in the left foot. There is no numbness in the rest of the leg. Finger to nose intact.  Skin: Skin is warm and dry. No rash noted.  Psychiatric: She has a normal mood and affect.    ED Course   Procedures (including critical care time)  Results for orders placed during the hospital encounter of 03/09/13  CBC WITH DIFFERENTIAL      Result Value Range   WBC 5.2  4.0 - 10.5 K/uL   RBC 3.71 (*) 3.87 - 5.11 MIL/uL   Hemoglobin 11.1 (*) 12.0 - 15.0 g/dL   HCT 19.1 (*) 47.8 - 29.5 %   MCV 89.2  78.0 - 100.0 fL   MCH 29.9  26.0 - 34.0 pg   MCHC 33.5  30.0 - 36.0 g/dL   RDW 62.1  30.8 - 65.7 %   Platelets 230  150 - 400 K/uL   Neutrophils Relative % 60  43 - 77 %   Neutro Abs 3.1  1.7 - 7.7 K/uL   Lymphocytes Relative 29  12 - 46 %   Lymphs Abs 1.5  0.7 - 4.0 K/uL   Monocytes Relative 9  3 - 12 %   Monocytes Absolute 0.5  0.1 - 1.0 K/uL   Eosinophils Relative 1  0 - 5 %   Eosinophils Absolute 0.1  0.0 - 0.7 K/uL   Basophils Relative 1  0 - 1 %   Basophils Absolute 0.0  0.0 - 0.1 K/uL  POCT I-STAT, CHEM 8      Result Value Range   Sodium 133 (*) 135 - 145 mEq/L   Potassium 3.1 (*) 3.5 - 5.1 mEq/L   Chloride 95 (*) 96 - 112 mEq/L   BUN 4 (*) 6 - 23 mg/dL   Creatinine, Ser 8.46  0.50 - 1.10 mg/dL   Glucose, Bld 96  70 - 99 mg/dL   Calcium, Ion 9.62  9.52 - 1.30 mmol/L   TCO2 27  0 - 100 mmol/L   Hemoglobin 11.9 (*) 12.0 - 15.0 g/dL   HCT 84.1 (*) 32.4 - 40.1 %  POCT I-STAT TROPONIN I      Result Value Range   Troponin i, poc 0.01  0.00 - 0.08  ng/mL   Comment 3  Dg Chest 2 View  03/09/2013   *RADIOLOGY REPORT*  Clinical Data: Chest pain and cough.  CHEST - 2 VIEW  Comparison: No priors.  Findings: Lung volumes are normal.  No consolidative airspace disease.  No pleural effusions.  No pneumothorax.  No pulmonary nodule or mass noted.  Pulmonary vasculature and the cardiomediastinal silhouette are within normal limits. Atherosclerosis in the thoracic aorta.  IMPRESSION: 1. No radiographic evidence of acute cardiopulmonary disease. 2.  Atherosclerosis.   Original Report Authenticated By: Trudie Reed, M.D.   Ct Head Wo Contrast  03/09/2013   *RADIOLOGY REPORT*  Clinical Data: Headache.  Left foot numbness.  CT HEAD WITHOUT CONTRAST  Technique:  Contiguous axial images were obtained from the base of the skull through the vertex without contrast.  Comparison: No priors.  Findings: Patchy areas of ill-defined decreased attenuation throughout the deep and periventricular white matter of the cerebral hemispheres bilaterally likely reflect mild chronic microvascular ischemic disease. No acute intracranial abnormalities.  Specifically, no evidence of acute intracranial hemorrhage, no definite findings of acute/subacute cerebral ischemia, no mass, mass effect, hydrocephalus or abnormal intra or extra-axial fluid collections.  Visualized paranasal sinuses and mastoids are well pneumatized.  No acute displaced skull fractures are identified.  IMPRESSION: 1.  No acute intracranial abnormalities. 2.  Mild chronic microvascular ischemic changes in the cerebral white matter.   Original Report Authenticated By: Trudie Reed, M.D.    Date: 03/09/2013  Rate: 58  Rhythm: normal sinus rhythm  QRS Axis: normal  Intervals: normal  ST/T Wave abnormalities: normal  Conduction Disutrbances:none  Narrative Interpretation:   Old EKG Reviewed: none available    1. CVA (cerebral infarction)     MDM  Patient had a negative head CT. Were unable to  get an MRI here tonight. I consult with Dr. Amada Jupiter who came down to see the patient and recommended admission for further stroke evaluation. He requests patient be transferred to [facility] given that it's unclear whether an MRI can be done here over the weekend. I will consult with the hospitalist at [facility].  Discussed with Dr Julian Reil.    Rolan Bucco, MD 03/09/13 1610  Rolan Bucco, MD 03/09/13 2348

## 2013-03-09 NOTE — ED Notes (Signed)
RUE:[id number]<JW> Expected date:<BR> Expected time:<BR> Means of arrival:<BR> Comments:<BR> Ems/FEMALE-NUMBNESS LEFT LEG

## 2013-03-09 NOTE — ED Notes (Signed)
Per EMS, Sudden onset left leg numbness with headache and left arm numbness.  Headache and left arm have resolved.  Left leg continues numbness.  Also c/o chest discomfort and neck pain which has resolved.  EKG wnl.  Pt states has had numbness before with no dx. Hx MI, HTN 158/86, hr 84, resp 20, sats wnl.  18g Lt wrist

## 2013-03-10 ENCOUNTER — Inpatient Hospital Stay (HOSPITAL_COMMUNITY): Payer: Medicare Other

## 2013-03-10 ENCOUNTER — Encounter (HOSPITAL_COMMUNITY): Payer: Self-pay | Admitting: Internal Medicine

## 2013-03-10 DIAGNOSIS — R209 Unspecified disturbances of skin sensation: Secondary | ICD-10-CM

## 2013-03-10 DIAGNOSIS — I10 Essential (primary) hypertension: Secondary | ICD-10-CM | POA: Diagnosis present

## 2013-03-10 DIAGNOSIS — R079 Chest pain, unspecified: Secondary | ICD-10-CM | POA: Diagnosis present

## 2013-03-10 DIAGNOSIS — R200 Anesthesia of skin: Secondary | ICD-10-CM | POA: Diagnosis present

## 2013-03-10 LAB — TROPONIN I: Troponin I: <0.3 ng/mL (ref <0.30)

## 2013-03-10 MED ORDER — PNEUMOCOCCAL VAC POLYVALENT 25 MCG/0.5ML IJ INJ
0.5000 mL | INJECTION | INTRAMUSCULAR | Status: DC
  Filled 2013-03-10: qty 0.5

## 2013-03-10 MED ORDER — NITROGLYCERIN 0.4 MG SL SUBL: 0.4000 mg | SUBLINGUAL_TABLET | SUBLINGUAL | Status: DC | PRN

## 2013-03-10 MED ORDER — ASPIRIN 81 MG PO CHEW
81.0000 mg | CHEWABLE_TABLET | Freq: Every day | ORAL | Status: DC
  Administered 2013-03-10 – 2013-03-11 (×2): 81 mg via ORAL
  Filled 2013-03-10 (×2): qty 1

## 2013-03-10 MED ORDER — HEPARIN SODIUM (PORCINE) 5000 UNIT/ML IJ SOLN
5000.0000 [IU] | Freq: Three times a day (TID) | INTRAMUSCULAR | Status: DC
  Filled 2013-03-10 (×2): qty 1

## 2013-03-10 MED ORDER — LATANOPROST 0.005 % OP SOLN
1.0000 [drp] | Freq: Every day | OPHTHALMIC | Status: DC
  Administered 2013-03-10: 1 [drp] via OPHTHALMIC
  Filled 2013-03-10: qty 2.5

## 2013-03-10 MED ORDER — BRIMONIDINE TARTRATE 0.2 % OP SOLN
1.0000 [drp] | Freq: Three times a day (TID) | OPHTHALMIC | Status: DC
  Administered 2013-03-10 – 2013-03-11 (×2): 1 [drp] via OPHTHALMIC
  Filled 2013-03-10: qty 5

## 2013-03-10 MED ORDER — LORAZEPAM 2 MG/ML IJ SOLN
0.5000 mg | Freq: Once | INTRAMUSCULAR | Status: AC
  Administered 2013-03-10: 0.5 mg via INTRAVENOUS

## 2013-03-10 NOTE — ED Notes (Signed)
[facility] notified for transport 

## 2013-03-10 NOTE — Progress Notes (Signed)
[facility] progress note. Chief complaint. Transfer note. History of present illness. This 67 year old female presented to [facility] long emergency room with complaints of left-sided numbness. Presentation worrisome for possible CVA and decision made to transfer patient to [facility] for a stroke workup. Patient also had chest pain on exertion. I came to see the patient at bedside and after arrival to [facility] cone to ensure she remains stable post transfer and that all orders transferred appropriately. Physical exam. Vital signs temperature 98.2, pulse 66, respiration 18, blood pressure 180/79. O2 sats 100%. General appearance. Well-developed elderly female who is alert, cooperative, and in no distress. Cardiac. Rate and rhythm regular. Lungs. Breath sounds clear and equal. Abdomen. Soft with positive bowel sounds. No pain with palpation. Neurologic. Cranial nerves 2 through grossly intact. No unilateral or focal defects. Slight soft touch deficit to the toes of the left foot. Impression/plan. Problem #1. Left leg numbness. Patient has already been seen by neurology Doctor Amada Jupiter. Will pursue MRI/MRA of the brain, echocardiogram, carotid Dopplers. Problem #2. Chest pain on exertion. Surgical troponins are pending. Patient appears clinically stable post transfer. All orders appear to of transferred appropriately.

## 2013-03-10 NOTE — ED Notes (Signed)
Upon returning to room, pt c/o slight chest pain. Pt has hx of irritation to aspirin.  MD, Julian Reil at bedside.

## 2013-03-10 NOTE — Consult Note (Signed)
Neurology Consultation Reason for Consult: Left-sided numbness Referring Physician: Ardeen Jourdain  CC: Left-sided numbness  History is obtained from: Patient, husband  HPI: Karla Richards is a 67 y.o. female with a history of migraines, hypertension who was in her normal state of health earlier today when she had a sharp pain in her head that lasted only for a brief second. Following this, she noticed numbness of her left leg that involved most of the leg. It since improved only involving the left foot. She also had confusion at this time which is also improved.   ROS: A 14 point ROS was performed and is negative except as noted in the HPI.  Past Medical History  Diagnosis Date  . Hypertension   . Glaucoma   . Genital herpes     Family History: No history of stroke  Social History: Tob: Never smoker  Exam: Current vital signs: BP 151/81  Pulse 61  Temp(Src) 98 F (36.7 C) (Oral)  Resp 20  SpO2 95% Vital signs in last 24 hours: Temp:  [98 F (36.7 C)] 98 F (36.7 C) (07/25 1957) Pulse Rate:  [61] 61 (07/25 1957) Resp:  [20] 20 (07/25 1957) BP: (151)/(81) 151/81 mmHg (07/25 1957) SpO2:  [95 %] 95 % (07/25 1957)  General: In bed, NAD  CV: Regular rate and rhythm Mental Status: Patient is awake, alert, oriented to person, place, month, year, and situation. Immediate and remote memory are intact. Patient is able to give a clear and coherent history. No signs of aphasia or neglect Cranial Nerves: II: Visual Fields are full. Pupils are equal, round, and reactive to light.  Discs are difficult to visualize. III,IV, VI: EOMI without ptosis or diploplia.  V: Facial sensation is symmetric to temperature VII: Facial movement is symmetric.  VIII: hearing is intact to voice X: Uvula elevates symmetrically XI: Shoulder shrug is symmetric. XII: tongue is midline without atrophy or fasciculations.  Motor: Tone is normal. Bulk is normal. 5/5 strength was present in all  four extremities.  Sensory: Sensation is symmetric to light touch and temperature in the arms and legs with the exception of the lower left leg which is decreased to temperature Deep Tendon Reflexes: 2+ and symmetric in the biceps and patellae.  Plantars: Toes are downgoing bilaterally.  Cerebellar: FNF are intact bilaterally Gait: Not assessed due to  multiple medical monitors in ED setting.   I have reviewed labs in epic and the results pertinent to this consultation are: Sodium 133, potassium 3.1  I have reviewed the images obtained: CT head-negative acute  Impression: 67 year old female with new left leg numbness and mild confusion. I am concerned for a small ischemic stroke in this patient with risk factors. She'll need an MRI to rule out stroke.  Recommendations: 1) MRI brain, if positive we'll pursue full stroke workup including:  1. HgbA1c, fasting lipid panel  2. MRA  of the brain without contrast  3. Echocardiogram  4. Carotid dopplers  5. PT consult, OT consult, Speech consult 2). Frequent neuro checks 3) Risk factor modification 4) Telemetry monitoring     Ritta Slot, MD [facility] [phone]  If 7pm- 7am, please page neurology on call at [phone].

## 2013-03-10 NOTE — H&P (Addendum)
[facility] History and Physical  Karla Richards ZOX:[id number] DOB: Oct 07, 1945 DOA: 03/09/2013  Referring physician: ED PCP: Billee Cashing, MD  Chief Complaint: Left sided numbness  HPI: Karla Richards is a 67 y.o. female who presents with R sided headache and L leg numbness.  Symptoms onset 2 hours PTA at the ED, headache was initially R sided but spread to the L side as well.  No L arm numbness, no weakness of any extremity.  She did have some associated dizziness and confusion and "couldnt remember how to drive to the hospital."  She subsequently called EMS and was brought in to the ED.  No h/o symptoms in the past, her headache has improved but leg numbness remains.  Risk factors include HTN and a strong family history of very premature atherosclerotic disease.  Review of Systems: The patient admits to intermittent symptoms of chest pain, symptoms have been occuring for several months, she hadnt noticed an association with exertion but in the ED she did have chest pain that was worse after walking to and from the restroom and better after she lay down.  12 systems reviewed and otherwise negative.  Past Medical History  Diagnosis Date  . Hypertension   . Glaucoma   . Genital herpes    Past Surgical History  Procedure Laterality Date  . Cesarean section    . Coronary catherization     Social History:  reports that she has never smoked. She does not have any smokeless tobacco history on file. She reports that she does not drink alcohol or use illicit drugs.   Allergies  Allergen Reactions  . Fish Allergy     And nuts  . Latex Itching  . Lodine (Etodolac)     unknown  . Milk-Related Compounds     Fluid in the ear and dizziness   . Norvasc (Amlodipine) Hives and Swelling  . Penicillins Itching  . Sulfur Nausea And Vomiting    Family History  Problem Relation Age of Onset  . Stroke Maternal Grandmother 50  . Heart attack Mother 32     Prior to Admission medications   Medication Sig Start Date End Date Taking? Authorizing Provider  brimonidine (ALPHAGAN) 0.2 % ophthalmic solution Place 1 drop into both eyes 3 (three) times daily.   Yes Historical Provider, MD  calcium carbonate (OS-CAL) 1250 MG chewable tablet Chew 2 tablets by mouth 2 (two) times daily.   Yes Historical Provider, MD  CHELATED IRON PO Take 1 tablet by mouth daily.   Yes Historical Provider, MD  latanoprost (XALATAN) 0.005 % ophthalmic solution Place 1 drop into both eyes at bedtime.   Yes Historical Provider, MD  triamterene-hydrochlorothiazide (MAXZIDE-25) 37.5-25 MG per tablet Take 1 tablet by mouth daily.   Yes Historical Provider, MD  Vitamin D, Ergocalciferol, (DRISDOL) 50000 UNITS CAPS Take 5,000 Units by mouth daily.   Yes Historical Provider, MD   Physical Exam: Filed Vitals:   03/09/13 1957  BP: 151/81  Pulse: 61  Temp: 98 F (36.7 C)  TempSrc: Oral  Resp: 20  SpO2: 95%    General:  NAD, resting comfortably in bed Eyes: PEERLA EOMI ENT: mucous membranes moist Neck: supple w/o JVD Cardiovascular: RRR w/o MRG Respiratory: CTA B Abdomen: soft, nt, nd, bs+ Skin: no rash nor lesion Musculoskeletal: MAE, full ROM all 4 extremities Psychiatric: normal tone and affect Neurologic: AAOx3, sensation diminished in LLE when compared with R, strength 5/5 in all 4 extremities  Labs on Admission:  Basic Metabolic Panel:  Recent Labs Lab 03/09/13 2049  NA 133*  K 3.1*  CL 95*  GLUCOSE 96  BUN 4*  CREATININE 1.10   Liver Function Tests: No results found for this basename: AST, ALT, ALKPHOS, BILITOT, PROT, ALBUMIN,  in the last 168 hours No results found for this basename: LIPASE, AMYLASE,  in the last 168 hours No results found for this basename: AMMONIA,  in the last 168 hours CBC:  Recent Labs Lab 03/09/13 2040 03/09/13 2049  WBC 5.2  --   NEUTROABS 3.1  --   HGB 11.1* 11.9*  HCT 33.1* 35.0*  MCV 89.2  --   PLT 230  --     Cardiac Enzymes: No results found for this basename: CKTOTAL, CKMB, CKMBINDEX, TROPONINI,  in the last 168 hours  BNP (last 3 results) No results found for this basename: PROBNP,  in the last 8760 hours CBG: No results found for this basename: GLUCAP,  in the last 168 hours  Radiological Exams on Admission: Dg Chest 2 View  03/09/2013   *RADIOLOGY REPORT*  Clinical Data: Chest pain and cough.  CHEST - 2 VIEW  Comparison: No priors.  Findings: Lung volumes are normal.  No consolidative airspace disease.  No pleural effusions.  No pneumothorax.  No pulmonary nodule or mass noted.  Pulmonary vasculature and the cardiomediastinal silhouette are within normal limits. Atherosclerosis in the thoracic aorta.  IMPRESSION: 1. No radiographic evidence of acute cardiopulmonary disease. 2.  Atherosclerosis.   Original Report Authenticated By: Trudie Reed, M.D.   Ct Head Wo Contrast  03/09/2013   *RADIOLOGY REPORT*  Clinical Data: Headache.  Left foot numbness.  CT HEAD WITHOUT CONTRAST  Technique:  Contiguous axial images were obtained from the base of the skull through the vertex without contrast.  Comparison: No priors.  Findings: Patchy areas of ill-defined decreased attenuation throughout the deep and periventricular white matter of the cerebral hemispheres bilaterally likely reflect mild chronic microvascular ischemic disease. No acute intracranial abnormalities.  Specifically, no evidence of acute intracranial hemorrhage, no definite findings of acute/subacute cerebral ischemia, no mass, mass effect, hydrocephalus or abnormal intra or extra-axial fluid collections.  Visualized paranasal sinuses and mastoids are well pneumatized.  No acute displaced skull fractures are identified.  IMPRESSION: 1.  No acute intracranial abnormalities. 2.  Mild chronic microvascular ischemic changes in the cerebral white matter.   Original Report Authenticated By: Trudie Reed, M.D.    EKG: Independently  reviewed.  Assessment/Plan Principal Problem:   Left leg numbness Active Problems:   Chest pain on exertion   HTN (hypertension)   1. Left leg numbness - patient getting stroke workup, risk factors include HTN and family history, on ASA 325 once in ED (does cause stomach upset in past), further anticoag orders per neuro consult.  Stroke work up including MRI ordered. 2. Chest pain on exertion - patients HEART score is at least 5 at this point so will likely need cardiac rule out.  Again her family history is very concerning given that her mother died of MI in early 79s and grandmother died of CVA in early 71s or late 66s.  And patient also informs me she had a stay in the CICU for suspected MI where they did find atherosclerosis in her heart in 2000. 3. HTN - holding home meds in setting of possible acute ischemic stroke.    Code Status: Full Code (must indicate code status--if unknown or must be presumed, indicate so) Family Communication: No  family in room (indicate person spoken with, if applicable, with phone number if by telephone) Disposition Plan: Admit to inpatient (indicate anticipated LOS)  Time spent: 70 min  Oreta Soloway M. [facility] Pager [phone]  If 7PM-7AM, please contact night-coverage [email or web address] Password [email or web address] 03/10/2013, 12:16 AM

## 2013-03-10 NOTE — Progress Notes (Addendum)
.  [facility] PROGRESS NOTE  Karla Richards ZOX:[id number] DOB: 1946-07-26 DOA: 03/09/2013 PCP: Billee Cashing, MD I have seen and examined pt who is a 66yo admitted this am by Dr Julian Reil with H/O HTN and family h/o + for premature CAD who presented with CP. CEs so far neg but she continues to have CP with exertion- I have consulted cards for possible in patient stress test.Continue current management plan as per Dr Julian Reil and follow.      Karla Richards  [facility] Pager [phone]. If 7PM-7AM, please contact night-coverage at [email or web address], password [facility] 03/10/2013, 10:43 AM  LOS: 1 day

## 2013-03-10 NOTE — ED Notes (Signed)
MD at bedside.  Pt refusing heparin

## 2013-03-10 NOTE — Progress Notes (Signed)
Patient ID: Karla Richards, female   DOB: 09/13/1945, 67 y.o.   MRN: [id number]   CARDIOLOGY CONSULT NOTE  Patient ID: Karla Richards MRN: [id number], DOB/AGE: [birth year]   Admit date: 03/09/2013 Date of Consult: 03/10/2013   Primary Physician: Billee Cashing, MD Primary Cardiologist: Verdis Prime MD  Pt. Profile 67 year old female with exertional chest burning. Also asked by Dr. Suanne Marker to evaluate for potential coronary artery disease.  Problem List  Past Medical History  Diagnosis Date  . Hypertension   . Glaucoma   . Genital herpes     Past Surgical History  Procedure Laterality Date  . Cesarean section    . Coronary catherization       Allergies  Allergies  Allergen Reactions  . Fish Allergy     And nuts  . Latex Itching  . Lodine (Etodolac)     unknown  . Milk-Related Compounds     Fluid in the ear and dizziness   . Norvasc (Amlodipine) Hives and Swelling  . Penicillins Itching  . Sulfur Nausea And Vomiting    HPI   She presented with a right-sided headache and left arm numbness. She's also had left foot numbness and coolness that ascended into her calf and thigh. She was admitted with possible stroke. Head CT showed microvascular disease but no acute intracranial abnormality. MRI was just done and report is pending. Carotid Doppler showed 40% plaque in the left internal carotid artery and minimal on the right with antegrade flow in both vertebrals.  She complained of chest burning last evening getting up to go to the bathroom. It happened again today. There were no other associated symptoms including nausea, vomiting, diaphoresis or shortness of breath. She is fairly vague on her history but appears that she's had this off and on for the past month. It can occur at rest but she's not exactly sure.  EKG is unremarkable. Troponin is negative x1. Chest x-ray showed some atherosclerosis in the thoracic aorta otherwise unremarkable.  She  has been taking care of in the past by Dr. Elsie Lincoln and Dr. see Robynn Pane. She says she is also seeing Dr. Verdis Prime as an outpatient. She prefers to stay with him.  She says she had a heart catheterization in the past but I cannot find it. She felt this was around 2000. I do see where she had a stress nuclear study that showed an ejection fraction of 54% with no ischemia and breast attenuation.  Her risk factors are age and hypertension  Inpatient Medications  . aspirin  81 mg Oral Daily  . brimonidine  1 drop Both Eyes TID  . latanoprost  1 drop Both Eyes QHS  . LORazepam  0.5-1 mg Intravenous Once  . [START ON 03/11/2013] pneumococcal 23 valent vaccine  0.5 mL Intramuscular Tomorrow-1000    Family History Family History  Problem Relation Age of Onset  . Stroke Maternal Grandmother 50  . Heart attack Mother 78     Social History History   Social History  . Marital Status: Married    Spouse Name: N/A    Number of Children: N/A  . Years of Education: N/A   Occupational History  . Not on file.   Social History Main Topics  . Smoking status: Never Smoker   . Smokeless tobacco: Not on file  . Alcohol Use: No  . Drug Use: No  . Sexually Active: Not on file   Other Topics Concern  . Not on file  Social History Narrative  . No narrative on file     Review of Systems  General:  No chills, fever, night sweats or weight changes.  Cardiovascular: No dyspnea on exertion, edema, orthopnea, palpitations, paroxysmal nocturnal dyspnea. Dermatological: No rash, lesions/masses Respiratory: No cough, dyspnea Urologic: No hematuria, dysuria Abdominal:   No nausea, vomiting, diarrhea, bright red blood per rectum, melena, or hematemesis Neurologic:  No visual changes, wkns, changes in mental status. All other systems reviewed and are otherwise negative except as noted above.  Physical Exam  Blood pressure 158/88, pulse 68, temperature 98.2 F (36.8 C), temperature source  Oral, resp. rate 18, height 4\' 11"  (1.499 m), weight 109 lb 12.6 oz (49.8 kg), SpO2 99.00%.  General: Pleasant, NAD Psych: Normal affect. Neuro: Alert and oriented X 3. Moves all extremities spontaneously. HEENT: Normal  Neck: Supple without bruits or JVD. Lungs:  Resp regular and unlabored, CTA. Heart: RRR no s3, s4, or murmurs. Abdomen: Soft, non-tender, non-distended, BS + x 4.  Extremities: No clubbing, cyanosis or edema. DP/PT/Radials 2+ and equal bilaterally.  Labs   Recent Labs  03/10/13 0042  TROPONINI <0.30   Lab Results  Component Value Date   WBC 5.2 03/09/2013   HGB 11.9* 03/09/2013   HCT 35.0* 03/09/2013   MCV 89.2 03/09/2013   PLT 230 03/09/2013    Recent Labs Lab 03/09/13 2049  NA 133*  K 3.1*  CL 95*  BUN 4*  CREATININE 1.10  GLUCOSE 96   No results found for this basename: CHOL, HDL, LDLCALC, TRIG   No results found for this basename: DDIMER    Radiology/Studies  Dg Chest 2 View  03/09/2013   *RADIOLOGY REPORT*  Clinical Data: Chest pain and cough.  CHEST - 2 VIEW  Comparison: No priors.  Findings: Lung volumes are normal.  No consolidative airspace disease.  No pleural effusions.  No pneumothorax.  No pulmonary nodule or mass noted.  Pulmonary vasculature and the cardiomediastinal silhouette are within normal limits. Atherosclerosis in the thoracic aorta.  IMPRESSION: 1. No radiographic evidence of acute cardiopulmonary disease. 2.  Atherosclerosis.   Original Report Authenticated By: Trudie Reed, M.D.   Ct Head Wo Contrast  03/09/2013   *RADIOLOGY REPORT*  Clinical Data: Headache.  Left foot numbness.  CT HEAD WITHOUT CONTRAST  Technique:  Contiguous axial images were obtained from the base of the skull through the vertex without contrast.  Comparison: No priors.  Findings: Patchy areas of ill-defined decreased attenuation throughout the deep and periventricular white matter of the cerebral hemispheres bilaterally likely reflect mild chronic  microvascular ischemic disease. No acute intracranial abnormalities.  Specifically, no evidence of acute intracranial hemorrhage, no definite findings of acute/subacute cerebral ischemia, no mass, mass effect, hydrocephalus or abnormal intra or extra-axial fluid collections.  Visualized paranasal sinuses and mastoids are well pneumatized.  No acute displaced skull fractures are identified.  IMPRESSION: 1.  No acute intracranial abnormalities. 2.  Mild chronic microvascular ischemic changes in the cerebral white matter.   Original Report Authenticated By: Trudie Reed, M.D.    ECG  Normal sinus rhythm, normal EKG  ASSESSMENT AND PLAN  #1 chest burning with exertion. She is a very poor historian and this may have been going on for at least a month. It also occurs arrest she thinks. She has several risk factors for coronary artery disease and has atherosclerosis on her chest x-ray. She most likely has some coronary artery disease. Is not clear as is causing her symptoms.  I  would check further troponins. I'll arrange for a stress Myoview tomorrow morning. Proceed with cath only if this is positive for ischemia.   Signed, Valera Castle, MD 03/10/2013, 2:17 PM

## 2013-03-10 NOTE — Progress Notes (Signed)
VASCULAR LAB PRELIMINARY  PRELIMINARY  PRELIMINARY  PRELIMINARY  Carotid Dopplers completed.    Preliminary report:  0-39% right ICA stenosis.  40% left ICA stenosis.  Vertebral artery flow is antegrade.  Aleza Pew, RVT 03/10/2013, 11:59 AM

## 2013-03-11 ENCOUNTER — Inpatient Hospital Stay (HOSPITAL_COMMUNITY): Payer: Medicare Other

## 2013-03-11 DIAGNOSIS — R079 Chest pain, unspecified: Secondary | ICD-10-CM

## 2013-03-11 DIAGNOSIS — I059 Rheumatic mitral valve disease, unspecified: Secondary | ICD-10-CM

## 2013-03-11 LAB — BASIC METABOLIC PANEL
Chloride: 101 mEq/L (ref 96–112)
GFR calc Af Amer: 69 mL/min — ABNORMAL LOW (ref >90)
Potassium: 4.1 mEq/L (ref 3.5–5.1)
Sodium: 139 mEq/L (ref 135–145)

## 2013-03-11 MED ORDER — TECHNETIUM TC 99M SESTAMIBI GENERIC - CARDIOLITE
10.0000 | Freq: Once | INTRAVENOUS | Status: AC | PRN
  Administered 2013-03-11: 10 via INTRAVENOUS

## 2013-03-11 MED ORDER — REGADENOSON 0.4 MG/5ML IV SOLN
0.4000 mg | Freq: Once | INTRAVENOUS | Status: AC
  Administered 2013-03-11: 0.4 mg via INTRAVENOUS
  Filled 2013-03-11: qty 5

## 2013-03-11 MED ORDER — ASPIRIN 81 MG PO CHEW: 81.0000 mg | CHEWABLE_TABLET | Freq: Every day | ORAL | Status: DC

## 2013-03-11 MED ORDER — REGADENOSON 0.4 MG/5ML IV SOLN
0.4000 mg | Freq: Once | INTRAVENOUS | Status: DC
  Filled 2013-03-11: qty 5

## 2013-03-11 MED ORDER — TECHNETIUM TC 99M SESTAMIBI GENERIC - CARDIOLITE
30.0000 | Freq: Once | INTRAVENOUS | Status: AC | PRN
  Administered 2013-03-11: 30 via INTRAVENOUS

## 2013-03-11 NOTE — Progress Notes (Signed)
Allergies reviewed with patient pre myoview test. Not allergic to Lexiscan. Allergies in chart do not seem complete. Pharmacy called to have a tech see her and make sure her allergy list is accurate.

## 2013-03-11 NOTE — Discharge Summary (Signed)
Physician Discharge Summary  Karla Richards WJX:[id number] DOB: 05/31/1946 DOA: 03/09/2013  PCP: Billee Cashing, MD  Admit date: 03/09/2013 Discharge date: 03/11/2013  Time spent:  <63minutes  Recommendations for Outpatient Follow-up:      Follow-up Information   Follow up with Billee Cashing, MD. (in 1-2weeks, call for appt upon discharge)    Contact information:   [address] [place] [address] [phone]     -Pending studies 2-D echo-pending at time of discharge, follow up with PCP Discharge Diagnoses:  Principal Problem:   Left leg numbness Active Problems:   Chest pain on exertion   HTN (hypertension)  hypokalemia  Discharge Condition: Improved/stable  Diet recommendation: 2 g sodium heart healthy  Filed Weights   03/10/13 0248  Weight: 49.8 kg (109 lb 12.6 oz)    History of present illness:  Karla Richards is a 67 y.o. female who presents with R sided headache and L leg numbness. Symptoms onset 2 hours PTA at the ED, headache was initially R sided but spread to the L side as well. No L arm numbness, no weakness of any extremity. She did have some associated dizziness and confusion and "couldnt remember how to drive to the hospital." She subsequently called EMS and was brought in to the ED. No h/o symptoms in the past, her headache has improved but leg numbness remains.  Risk factors include HTN and a strong family history of very premature atherosclerotic disease.   Hospital Course:  Left leg numbness -Upon admission she was placed on Asa and a stroke workup was ordered-CT scan of brain was negative for acute findings, carotid Dopplers showed 0-39% right ICA stenosis 40% left ICA stenosis. A 2-D echo was ordered and results are pending at this time-she is to followup with her PCP Dr. Ronne Binning for results. Neuro was consulted and they saw the patient- an MRI which came back negative CVA, a few punctate white matter foci within  the cerebral hemispheres were noted and this were discussed with Dr Roseanne Reno and he states this changes are likely secondary to HTN. The numbness/tingling has resolved. Okay to discharge home from their standpoint and she is to follow up outpatient Active Problems:  Chest pain on exertion  -Cardiac enzymes were cycled and came back negative. Cardiology was consulted and saw the patient on a stress test was ordered and came back negative for myocardial ischemia. Her chest pain resolved and she has remained chest pain free. HTN (hypertension) -She is to continue Maxzide and followup with her PCP Hypokalemia -Her potassium was replaced in the hospital. Procedures:  Consultations:  Neuro   CARDIOLOGY  Discharge Exam: Filed Vitals:   03/11/13 1036 03/11/13 1038 03/11/13 1039 03/11/13 1345  BP: 150/71 140/65  137/81  Pulse: 122 109 104 58  Temp:    97.9 F (36.6 C)  TempSrc:    Oral  Resp:    18  Height:      Weight:      SpO2:    100%   Exam:  General: alert & oriented x 3 In NAD Cardiovascular: RRR, nl S1 s2 Respiratory: CTAB Abdomen: soft +BS NT/ND, no masses palpable Extremities: No cyanosis and no edema     Discharge Instructions  Discharge Orders   Future Orders Complete By Expires     Diet - low sodium heart healthy  As directed     Increase activity slowly  As directed         Medication List  aspirin 81 MG chewable tablet  Chew 1 tablet (81 mg total) by mouth daily.     brimonidine 0.2 % ophthalmic solution  Commonly known as:  ALPHAGAN  Place 1 drop into both eyes 3 (three) times daily.     calcium carbonate 1250 MG chewable tablet  Commonly known as:  OS-CAL  Chew 2 tablets by mouth 2 (two) times daily.     CHELATED IRON PO  Take 1 tablet by mouth daily.     triamterene-hydrochlorothiazide 37.5-25 MG per tablet  Commonly known as:  MAXZIDE-25  Take 1 tablet by mouth daily.     Vitamin D (Ergocalciferol) 50000 UNITS Caps  Commonly known  as:  DRISDOL  Take 5,000 Units by mouth daily.     XALATAN 0.005 % ophthalmic solution  Generic drug:  latanoprost  Place 1 drop into both eyes at bedtime.       Allergies  Allergen Reactions  . Fish Allergy Anaphylaxis  . Other Anaphylaxis    Tree nuts  . Peanuts (Peanut Oil) Anaphylaxis  . Beta Adrenergic Blockers   . Iodine Solution (Povidone Iodine) Itching    Topical solution caused severe itching  . Latex Itching  . Milk-Related Compounds     Fluid in the ear and dizziness   . Norvasc (Amlodipine) Hives and Swelling  . Penicillins Itching  . Shellfish Allergy Swelling  . Sulfur Nausea And Vomiting       Follow-up Information   Follow up with Billee Cashing, MD. (in 1-2weeks, call for appt upon discharge)    Contact information:   [address] [place] [address] [phone]        The results of significant diagnostics from this hospitalization (including imaging, microbiology, ancillary and laboratory) are listed below for reference.    Significant Diagnostic Studies: Dg Chest 2 View  03/09/2013   *RADIOLOGY REPORT*  Clinical Data: Chest pain and cough.  CHEST - 2 VIEW  Comparison: No priors.  Findings: Lung volumes are normal.  No consolidative airspace disease.  No pleural effusions.  No pneumothorax.  No pulmonary nodule or mass noted.  Pulmonary vasculature and the cardiomediastinal silhouette are within normal limits. Atherosclerosis in the thoracic aorta.  IMPRESSION: 1. No radiographic evidence of acute cardiopulmonary disease. 2.  Atherosclerosis.   Original Report Authenticated By: Trudie Reed, M.D.   Ct Head Wo Contrast  03/09/2013   *RADIOLOGY REPORT*  Clinical Data: Headache.  Left foot numbness.  CT HEAD WITHOUT CONTRAST  Technique:  Contiguous axial images were obtained from the base of the skull through the vertex without contrast.  Comparison: No priors.  Findings: Patchy areas of ill-defined decreased attenuation throughout the  deep and periventricular white matter of the cerebral hemispheres bilaterally likely reflect mild chronic microvascular ischemic disease. No acute intracranial abnormalities.  Specifically, no evidence of acute intracranial hemorrhage, no definite findings of acute/subacute cerebral ischemia, no mass, mass effect, hydrocephalus or abnormal intra or extra-axial fluid collections.  Visualized paranasal sinuses and mastoids are well pneumatized.  No acute displaced skull fractures are identified.  IMPRESSION: 1.  No acute intracranial abnormalities. 2.  Mild chronic microvascular ischemic changes in the cerebral white matter.   Original Report Authenticated By: Trudie Reed, M.D.   Mr Brain Wo Contrast  03/10/2013   *RADIOLOGY REPORT*  Clinical Data:  Left sided numbness.  MRI HEAD WITHOUT CONTRAST MRA HEAD WITHOUT CONTRAST  Technique:  Multiplanar, multiecho pulse sequences of the brain and surrounding structures were obtained without intravenous  contrast. Angiographic images of the head were obtained using MRA technique without contrast.  Comparison:  Head CT 07/25  MRI HEAD  Findings:  Diffusion imaging does not show any acute or subacute infarction.  The brainstem and cerebellum are normal.  The cerebral hemispheres are normal except for a few scattered punctate foci of white matter signal, less than often seen in healthy individuals of this age.  No cortical or large vessel territory insult.  No mass lesion, hemorrhage, hydrocephalus or extra-axial collection.  No pituitary mass.  Sinuses, middle ears and mastoids are clear.  IMPRESSION: No acute finding.  No cause of the described symptoms is identified.  A few punctate white matter foci within the cerebral hemispheres, less than often seen in healthy individuals of this age.  MRA HEAD  Findings: Both internal carotid arteries are widely patent into the brain.  The anterior and middle cerebral vessels are normal without proximal stenosis, aneurysm or  vascular malformation.  Both vertebral arteries are widely patent to the basilar.  No basilar stenosis.  Posterior circulation branch vessels are normal.  IMPRESSION: Normal intracranial MR angiography of the large and medium-sized vessels.   Original Report Authenticated By: Paulina Fusi, M.D.   Nm Myocar Multi W/spect W/wall Motion / Ef  03/11/2013   *RADIOLOGY REPORT*  Clinical Data:  Chest pain.  MYOCARDIAL IMAGING WITH SPECT (REST AND PHARMACOLOGIC-STRESS) GATED LEFT VENTRICULAR WALL MOTION STUDY LEFT VENTRICULAR EJECTION FRACTION  Technique:  Resting myocardial SPECT imaging was initially performed after intravenous administration of radiopharmaceutical. Myocardial SPECT was subsequently performed after additional radiopharmaceutical injection during pharmacologic-stress (Lexiscan)supervised by the Cardiology staff.  Quantitative gated imaging was also performed to evaluate left ventricular wall motion, and estimate left ventricular ejection fraction.  Radiopharmaceutical:  10 mCi Tc-35m sestamibi at rest and 30 mCi during stress.  Comparison: Chest radiographs 03/09/2013.  Report from nuclear medicine myocardial scan 02/03/1999.  Findings:  SPECT images demonstrate normal left ventricular activity aside from a small fixed perfusion defect involving the anteroseptal wall, likely reflecting breast attenuation.  There are no suspicious fixed or reversible perfusion defects.  Gated cine images were reviewed on the workstation and demonstrate normal left ventricular wall motion and systolic thickening. The QGS ejection fraction measured at rest is 75% with an end diastolic volume of 52 ml and an end-systolic volume of 13 ml.  IMPRESSION:  Normal examination without evidence of pharmacologically induced myocardial ischemia.  The calculated left ventricular ejection fraction is 75%.   Original Report Authenticated By: Carey Bullocks, M.D.   Mr Mra Head/brain Wo Cm  03/10/2013   *RADIOLOGY REPORT*  Clinical  Data:  Left sided numbness.  MRI HEAD WITHOUT CONTRAST MRA HEAD WITHOUT CONTRAST  Technique:  Multiplanar, multiecho pulse sequences of the brain and surrounding structures were obtained without intravenous contrast. Angiographic images of the head were obtained using MRA technique without contrast.  Comparison:  Head CT 07/25  MRI HEAD  Findings:  Diffusion imaging does not show any acute or subacute infarction.  The brainstem and cerebellum are normal.  The cerebral hemispheres are normal except for a few scattered punctate foci of white matter signal, less than often seen in healthy individuals of this age.  No cortical or large vessel territory insult.  No mass lesion, hemorrhage, hydrocephalus or extra-axial collection.  No pituitary mass.  Sinuses, middle ears and mastoids are clear.  IMPRESSION: No acute finding.  No cause of the described symptoms is identified.  A few punctate white matter foci within the  cerebral hemispheres, less than often seen in healthy individuals of this age.  MRA HEAD  Findings: Both internal carotid arteries are widely patent into the brain.  The anterior and middle cerebral vessels are normal without proximal stenosis, aneurysm or vascular malformation.  Both vertebral arteries are widely patent to the basilar.  No basilar stenosis.  Posterior circulation branch vessels are normal.  IMPRESSION: Normal intracranial MR angiography of the large and medium-sized vessels.   Original Report Authenticated By: Paulina Fusi, M.D.    Microbiology: No results found for this or any previous visit (from the past 240 hour(s)).   Labs: Basic Metabolic Panel:  Recent Labs Lab 03/09/13 2049 03/11/13 1300  NA 133* 139  K 3.1* 4.1  CL 95* 101  CO2  --  29  GLUCOSE 96 97  BUN 4* 6  CREATININE 1.10 0.96  CALCIUM  --  9.9   Liver Function Tests: No results found for this basename: AST, ALT, ALKPHOS, BILITOT, PROT, ALBUMIN,  in the last 168 hours No results found for this  basename: LIPASE, AMYLASE,  in the last 168 hours No results found for this basename: AMMONIA,  in the last 168 hours CBC:  Recent Labs Lab 03/09/13 2040 03/09/13 2049  WBC 5.2  --   NEUTROABS 3.1  --   HGB 11.1* 11.9*  HCT 33.1* 35.0*  MCV 89.2  --   PLT 230  --    Cardiac Enzymes:  Recent Labs Lab 03/10/13 0042  TROPONINI <0.30   BNP: BNP (last 3 results) No results found for this basename: PROBNP,  in the last 8760 hours CBG: No results found for this basename: GLUCAP,  in the last 168 hours     Signed:  Kela Millin  [facility] 03/11/2013, 2:23 PM

## 2013-03-11 NOTE — Progress Notes (Signed)
PT screen & d/c  Patient Details Name: Karla Richards MRN: [id number] DOB: April 09, 1946   Cancelled Treatment:    Reason Eval/Treat Not Completed: PT screened, no needs identified, will sign off.  Pt being d/c and spoke with RN and pt in room moving and has no PT needs.  Please reorder if appropriate.  PT will sign off.   Malania Gawthrop 03/11/2013, 3:26 PM  Jake Shark, PT DPT [phone]

## 2013-03-11 NOTE — Progress Notes (Signed)
  Echocardiogram 2D Echocardiogram has been performed.  Jorje Guild 03/11/2013, 3:35 PM

## 2013-03-11 NOTE — Progress Notes (Signed)
D/c instructions reviewed with pt and family. Copy of instructions given to pt. Pt able to give stroke info as teach back and knows when to call 911. Stroke education reviewed and handout given. Pt d/c'd via wheelchair with belongings with family, escorted by unit NT.

## 2013-03-11 NOTE — Evaluation (Signed)
SLP Cancellation Note  Patient Details Name: Karla Richards MRN: [id number] DOB: [birth year]   Cancelled treatment:       Reason Eval/Treat Not Completed: Other (comment);Patient at procedure or test/unavailable   Donavan Burnet, MS [facility] SLP [phone]

## 2013-03-12 NOTE — Progress Notes (Signed)
Utilization review completed.  

## 2013-07-21 ENCOUNTER — Emergency Department (INDEPENDENT_AMBULATORY_CARE_PROVIDER_SITE_OTHER): Payer: Medicare Other

## 2013-07-21 ENCOUNTER — Emergency Department (INDEPENDENT_AMBULATORY_CARE_PROVIDER_SITE_OTHER)
Admission: EM | Admit: 2013-07-21 | Discharge: 2013-07-21 | Disposition: A | Discharge status: Home / Self Care | Payer: Medicare Other | Source: Home / Self Care | Attending: Emergency Medicine | Admitting: Emergency Medicine

## 2013-07-21 ENCOUNTER — Encounter (HOSPITAL_COMMUNITY): Payer: Self-pay | Admitting: Emergency Medicine

## 2013-07-21 DIAGNOSIS — R059 Cough, unspecified: Secondary | ICD-10-CM

## 2013-07-21 DIAGNOSIS — R05 Cough: Secondary | ICD-10-CM

## 2013-07-21 DIAGNOSIS — J029 Acute pharyngitis, unspecified: Secondary | ICD-10-CM

## 2013-07-21 DIAGNOSIS — J019 Acute sinusitis, unspecified: Secondary | ICD-10-CM

## 2013-07-21 MED ORDER — ACETAMINOPHEN 325 MG PO TABS
ORAL_TABLET | ORAL | Status: AC
  Filled 2013-07-21: qty 2

## 2013-07-21 MED ORDER — AZITHROMYCIN 250 MG PO TABS: ORAL_TABLET | ORAL | Status: DC

## 2013-07-21 MED ORDER — ACETAMINOPHEN 325 MG PO TABS
ORAL_TABLET | ORAL | Status: AC
  Filled 2013-07-21: qty 1

## 2013-07-21 MED ORDER — ACETAMINOPHEN 325 MG PO TABS
650.0000 mg | ORAL_TABLET | Freq: Once | ORAL | Status: AC
  Administered 2013-07-21: 650 mg via ORAL

## 2013-07-21 NOTE — ED Notes (Signed)
Updated on wait. Comfort measures offered. 

## 2013-07-21 NOTE — ED Provider Notes (Signed)
CSN: [id number]     Arrival date & time 07/21/13  1107 History   First MD Initiated Contact with Patient 07/21/13 1239     Chief Complaint  Patient presents with  . Sore Throat  . Fever  . Cough   (Consider location/radiation/quality/duration/timing/severity/associated sxs/prior Treatment) Patient is a 67 y.o. female presenting with fever, cough, and URI. The history is provided by the patient.  Fever Associated symptoms: chills, congestion, cough, headaches, rhinorrhea and sore throat   Associated symptoms: no chest pain and no ear pain   Cough Associated symptoms: chills, fever, headaches, rhinorrhea and sore throat   Associated symptoms: no chest pain, no ear pain, no shortness of breath and no wheezing   URI Presenting symptoms: congestion, cough, facial pain, fever, rhinorrhea and sore throat   Presenting symptoms: no ear pain   Severity:  Moderate Onset quality:  Gradual Duration:  3 days Timing:  Constant Progression:  Worsening Chronicity:  Recurrent Relieved by:  None tried Associated symptoms: headaches, sinus pain and swollen glands   Associated symptoms: no wheezing   Risk factors: being elderly   Risk factors: no chronic respiratory disease, no recent illness, no recent travel and no sick contacts   Pt reports a 3 day h/o sore throat, cough and sinus congestion that has been associated with thick yellowish green nasal secretions. Pt admits to h/o chronic sinus "problems and infections". Pt also reports chills, frontal area h/a's L>R and facial pain L>R. Symptoms are c/w sinus infections in the past. Cough is productive of clear secretions and pt relates her cough to her sinus drainage. She has also noted intermittent dizziness especially w/ coughing. Pt does not get the flu or PNA vaccines d/t multiple allergies.    Past Medical History  Diagnosis Date  . Hypertension   . Glaucoma   . Genital herpes    Past Surgical History  Procedure Laterality Date  . Cesarean  section    . Coronary catherization     Family History  Problem Relation Age of Onset  . Stroke Maternal Grandmother 50  . Heart attack Mother 26   History  Substance Use Topics  . Smoking status: Never Smoker   . Smokeless tobacco: Not on file  . Alcohol Use: No   OB History   Grav Para Term Preterm Abortions TAB SAB Ect Mult Living                 Review of Systems  Constitutional: Positive for fever and chills.  HENT: Positive for congestion, rhinorrhea, sinus pressure and sore throat. Negative for ear discharge, ear pain, facial swelling, nosebleeds and voice change.   Respiratory: Positive for cough. Negative for shortness of breath and wheezing.   Cardiovascular: Negative.  Negative for chest pain.  Gastrointestinal: Negative.   Endocrine: Negative.   Genitourinary: Negative.   Musculoskeletal: Negative.   Allergic/Immunologic: Negative.   Neurological: Positive for headaches.  Hematological: Negative.   Psychiatric/Behavioral: Negative.     Allergies  Fish allergy; Other; Peanuts; Beta adrenergic blockers; Iodine solution; Latex; Milk-related compounds; Norvasc; Penicillins; Shellfish allergy; and Sulfa antibiotics  Home Medications   Current Outpatient Rx  Name  Route  Sig  Dispense  Refill  . aspirin 81 MG chewable tablet   Oral   Chew 1 tablet (81 mg total) by mouth daily.   30 tablet   0   . brimonidine (ALPHAGAN) 0.2 % ophthalmic solution   Both Eyes   Place 1 drop into both eyes 3 (  three) times daily.         . calcium carbonate (OS-CAL) 1250 MG chewable tablet   Oral   Chew 2 tablets by mouth 2 (two) times daily.         . CHELATED IRON PO   Oral   Take 1 tablet by mouth daily.         Marland Kitchen estradiol (ESTRACE) 0.1 MG/GM vaginal cream   Vaginal   Place 1 Applicatorful vaginally once a week.         . latanoprost (XALATAN) 0.005 % ophthalmic solution   Both Eyes   Place 1 drop into both eyes at bedtime.         .  triamterene-hydrochlorothiazide (MAXZIDE-25) 37.5-25 MG per tablet   Oral   Take 1 tablet by mouth daily.         . Vitamin D, Ergocalciferol, (DRISDOL) 50000 UNITS CAPS   Oral   Take 5,000 Units by mouth daily.          BP 134/85  Pulse 89  Temp(Src) 101.1 F (38.4 C) (Oral)  Resp 24  SpO2 98% Physical Exam  Constitutional: She appears well-developed and well-nourished. She does not have a sickly appearance. No distress.  HENT:  Head: Normocephalic and atraumatic.  Right Ear: Tympanic membrane, external ear and ear canal normal.  Left Ear: Tympanic membrane, external ear and ear canal normal.  Nose: Mucosal edema present. Right sinus exhibits no maxillary sinus tenderness and no frontal sinus tenderness. Left sinus exhibits no maxillary sinus tenderness and no frontal sinus tenderness.  Mouth/Throat: Uvula is midline and mucous membranes are normal. Posterior oropharyngeal erythema present.  Green purulent exudate and very erythematous turbinates.  Eyes: Conjunctivae are normal.  Cardiovascular: Normal rate and regular rhythm.   Pulmonary/Chest: Effort normal and breath sounds normal.  Lymphadenopathy:    She has cervical adenopathy.  Neurological: She is alert.  Skin: Skin is warm and dry. No rash noted.  Psychiatric: She has a normal mood and affect.    ED Course  Procedures (including critical care time) Labs Review Labs Reviewed  POCT RAPID STREP A ([facility] URG CARE ONLY)   Imaging Review Dg Chest 2 View  07/21/2013   CLINICAL DATA:  Cough, congestion, fever  EXAM: CHEST  2 VIEW  COMPARISON:  03/09/2013  FINDINGS: Cardiomediastinal silhouette is stable. No acute infiltrate or pleural effusion. No pulmonary edema. Mild degenerative changes thoracic spine again noted.  IMPRESSION: No active cardiopulmonary disease.   Electronically Signed   By: Natasha Mead M.D.   On: 07/21/2013 13:28    EKG Interpretation    Date/Time:    Ventricular Rate:    PR Interval:    QRS  Duration:   QT Interval:    QTC Calculation:   R Axis:     Text Interpretation:              MDM  No diagnosis found. 3 day h/o sore throat, cough and sinus congestion/pain w/ associated facial pain L>R. H/o sinus "problems" and infections. Sx's associated w/ fever, frontal area h/a's and intermittent dizziness. PE and HPI most c/w Acute sinusitis. Will treat w/ Z-Pack (PCN allergic) and encourage rest, fluids and PCP f/u if not improving. Pt agreeable w/ plan.    Leanne Chang, NP 07/21/13 [phone]

## 2013-07-21 NOTE — ED Provider Notes (Signed)
Medical screening examination/treatment/procedure(s) were performed by non-physician practitioner and as supervising physician I was immediately available for consultation/collaboration.  Leslee Home, M.D.  Reuben Likes, MD 07/21/13 2051

## 2013-07-21 NOTE — ED Notes (Signed)
Started with sore throat Thurs AM along with clear productive cough, sinus drainage, yellow nasal discharge.  Also c/o pain across back "from coughing".  Has been taking her normal ASA dose - no Tyl or IBU.

## 2013-07-23 LAB — CULTURE, GROUP A STREP

## 2013-10-27 ENCOUNTER — Encounter (HOSPITAL_COMMUNITY): Payer: Self-pay | Admitting: Emergency Medicine

## 2013-10-27 ENCOUNTER — Emergency Department (INDEPENDENT_AMBULATORY_CARE_PROVIDER_SITE_OTHER)
Admission: EM | Admit: 2013-10-27 | Discharge: 2013-10-27 | Disposition: A | Discharge status: Home / Self Care | Payer: Medicare Other | Source: Home / Self Care | Attending: Family Medicine | Admitting: Family Medicine

## 2013-10-27 DIAGNOSIS — L603 Nail dystrophy: Secondary | ICD-10-CM

## 2013-10-27 DIAGNOSIS — L608 Other nail disorders: Secondary | ICD-10-CM

## 2013-10-27 NOTE — ED Provider Notes (Signed)
CSN: [id number]     Arrival date & time 10/27/13  1730 History   First MD Initiated Contact with Patient 10/27/13 1804     Chief Complaint  Patient presents with  . Foot Pain   (Consider location/radiation/quality/duration/timing/severity/associated sxs/prior Treatment) Patient is a 68 y.o. female presenting with lower extremity pain. The history is provided by the patient.  Foot Pain This is a new problem. The current episode started more than 1 week ago (4wks ago treated with doxy by lmd for possible infection x2 but no change). The problem has been gradually worsening.    Past Medical History  Diagnosis Date  . Hypertension   . Glaucoma   . Genital herpes    Past Surgical History  Procedure Laterality Date  . Cesarean section    . Coronary catherization     Family History  Problem Relation Age of Onset  . Stroke Maternal Grandmother 50  . Heart attack Mother 87   History  Substance Use Topics  . Smoking status: Never Smoker   . Smokeless tobacco: Not on file  . Alcohol Use: No   OB History   Grav Para Term Preterm Abortions TAB SAB Ect Mult Living                 Review of Systems  Skin: Positive for rash.    Allergies  Fish allergy; Other; Peanuts; Beta adrenergic blockers; Iodine solution; Latex; Milk-related compounds; Norvasc; Penicillins; Shellfish allergy; and Sulfa antibiotics  Home Medications   Current Outpatient Rx  Name  Route  Sig  Dispense  Refill  . aspirin 81 MG chewable tablet   Oral   Chew 1 tablet (81 mg total) by mouth daily.   30 tablet   0   . azithromycin (ZITHROMAX Z-PAK) 250 MG tablet      Take 2 tabs on day 1 then 1 tab on days 2-5.   6 tablet   0   . brimonidine (ALPHAGAN) 0.2 % ophthalmic solution   Both Eyes   Place 1 drop into both eyes 3 (three) times daily.         . calcium carbonate (OS-CAL) 1250 MG chewable tablet   Oral   Chew 2 tablets by mouth 2 (two) times daily.         . CHELATED IRON PO   Oral  Take 1 tablet by mouth daily.         Marland Kitchen estradiol (ESTRACE) 0.1 MG/GM vaginal cream   Vaginal   Place 1 Applicatorful vaginally once a week.         . latanoprost (XALATAN) 0.005 % ophthalmic solution   Both Eyes   Place 1 drop into both eyes at bedtime.         . triamterene-hydrochlorothiazide (MAXZIDE-25) 37.5-25 MG per tablet   Oral   Take 1 tablet by mouth daily.         . Vitamin D, Ergocalciferol, (DRISDOL) 50000 UNITS CAPS   Oral   Take 5,000 Units by mouth daily.          BP 166/85  Pulse 66  Temp(Src) 97.9 F (36.6 C) (Oral)  Resp 16  SpO2 98% Physical Exam  Nursing note and vitals reviewed. Constitutional: She is oriented to person, place, and time. She appears well-developed and well-nourished.  Musculoskeletal: Normal range of motion.  Sl discoloration under left gt toenail, no erythema or drainage. No sts.  Neurological: She is alert and oriented to person, place, and time.  Skin: Skin is warm and dry.    ED Course  Procedures (including critical care time) Labs Review Labs Reviewed - No data to display Imaging Review No results found.   MDM   1. Nail dystrophy       Billy Fischer, MD 10/27/13 [phone]

## 2013-10-27 NOTE — Discharge Instructions (Signed)
Soak toe nightly , see podiatrist if further problems

## 2013-10-27 NOTE — ED Notes (Signed)
Pt  Reports  She  Was  Seen      almost   6  Weeks  Ago  For  A  possible  Foot  Infection and  Was  Placed  On  Doxycycline     She  Reports  That  She  Is  Concerned  That it  May  Still  Be  Infected           She reports       That   It is  painfull  At  Times   But  Not  Now

## 2014-03-22 ENCOUNTER — Emergency Department (HOSPITAL_COMMUNITY)
Admission: EM | Admit: 2014-03-22 | Discharge: 2014-03-22 | Disposition: A | Discharge status: Home / Self Care | Payer: Medicare Other | Attending: Emergency Medicine | Admitting: Emergency Medicine

## 2014-03-22 ENCOUNTER — Encounter (HOSPITAL_COMMUNITY): Payer: Self-pay | Admitting: Emergency Medicine

## 2014-03-22 DIAGNOSIS — Z7982 Long term (current) use of aspirin: Secondary | ICD-10-CM | POA: Insufficient documentation

## 2014-03-22 DIAGNOSIS — Z91040 Latex allergy status: Secondary | ICD-10-CM | POA: Diagnosis not present

## 2014-03-22 DIAGNOSIS — F411 Generalized anxiety disorder: Secondary | ICD-10-CM | POA: Insufficient documentation

## 2014-03-22 DIAGNOSIS — Z79899 Other long term (current) drug therapy: Secondary | ICD-10-CM | POA: Insufficient documentation

## 2014-03-22 DIAGNOSIS — T50905A Adverse effect of unspecified drugs, medicaments and biological substances, initial encounter: Secondary | ICD-10-CM

## 2014-03-22 DIAGNOSIS — Z880 Allergy status to penicillin: Secondary | ICD-10-CM | POA: Insufficient documentation

## 2014-03-22 DIAGNOSIS — Z792 Long term (current) use of antibiotics: Secondary | ICD-10-CM | POA: Insufficient documentation

## 2014-03-22 DIAGNOSIS — T495X5A Adverse effect of ophthalmological drugs and preparations, initial encounter: Secondary | ICD-10-CM | POA: Insufficient documentation

## 2014-03-22 DIAGNOSIS — Z8619 Personal history of other infectious and parasitic diseases: Secondary | ICD-10-CM | POA: Diagnosis not present

## 2014-03-22 DIAGNOSIS — R1310 Dysphagia, unspecified: Secondary | ICD-10-CM | POA: Insufficient documentation

## 2014-03-22 DIAGNOSIS — H5789 Other specified disorders of eye and adnexa: Secondary | ICD-10-CM | POA: Insufficient documentation

## 2014-03-22 MED ORDER — DIPHENHYDRAMINE HCL 25 MG PO TABS: 25.0000 mg | ORAL_TABLET | Freq: Four times a day (QID) | ORAL | Status: DC

## 2014-03-22 MED ORDER — PREDNISONE (PAK) 10 MG PO TABS: ORAL_TABLET | Freq: Every day | ORAL | Status: DC

## 2014-03-22 MED ORDER — PREDNISONE 20 MG PO TABS
40.0000 mg | ORAL_TABLET | Freq: Once | ORAL | Status: AC
  Administered 2014-03-22: 40 mg via ORAL
  Filled 2014-03-22: qty 2

## 2014-03-22 MED ORDER — FAMOTIDINE 20 MG PO TABS: 20.0000 mg | ORAL_TABLET | Freq: Two times a day (BID) | ORAL | Status: DC

## 2014-03-22 MED ORDER — FAMOTIDINE 20 MG PO TABS
20.0000 mg | ORAL_TABLET | Freq: Once | ORAL | Status: AC
  Administered 2014-03-22: 20 mg via ORAL
  Filled 2014-03-22: qty 1

## 2014-03-22 NOTE — ED Provider Notes (Signed)
Medical screening examination/treatment/procedure(s) were performed by non-physician practitioner and as supervising physician I was immediately available for consultation/collaboration.   EKG Interpretation None        Wandra Arthurs, MD 03/22/14 [phone]

## 2014-03-22 NOTE — ED Provider Notes (Signed)
CSN: [id number]     Arrival date & time 03/22/14  1641 History  This chart was scribed for Karla Richards, [place] working with Karla Arthurs, MD by Roxan Diesel, ED Scribe. This patient was seen in room TR08C/TR08C and the patient's care was started at 5:13 PM.   Chief Complaint  Patient presents with  . Allergic Reaction    The history is provided by the patient. No language interpreter was used.    HPI Comments: Karla Richards is a 68 y.o. female who presents to the Emergency Department complaining of a possible allergic reaction that began 5 hours ago.  Pt states she took brimonidine eye drops today at 12:30 PM and when she put them in noted that her eyes "felt a little strange."  At around 3:15 PM she developed mild itchiness and discomfort to bilateral eyelids.  Later she went into the restroom and noticed her eyelids appeared slightly red and swollen.  1 hour later she developed a sensation in her throat described as feeling like there is "something there."  She attempted to drink some water and noted it felt like there was a "lump" in her throat.  She also had some watery discharge from her eyes earlier but none since then.  She also reports "cloudiness" in both eyes.  She has h/o cataracts but states "they were not causing the cloudiness."  She denies rash, difficulty breathing, itching or swelling in mouth.  She has attempted to treat symptoms by taking one Benadryl 15 minutes ago, without relief.  Pt has used brimonidine eye drops previously and has been using them for the past 3 weeks.  She has had similar eyelid redness from taking these drops previously but her episode today is much more severe.  Pt also notes that she rented a car today and "smelled smoke in the vent" and feels that she may have had a reaction to that.  She has a nut allergy.  She keeps an Epi-Pen with her but has not used it today.  Pt is on brimonidine and one other eye drop for her cataracts.   Past Medical  History  Diagnosis Date  . Hypertension   . Glaucoma   . Genital herpes     Past Surgical History  Procedure Laterality Date  . Cesarean section    . Coronary catherization      Family History  Problem Relation Age of Onset  . Stroke Maternal Grandmother 50  . Heart attack Mother 54    History  Substance Use Topics  . Smoking status: Never Smoker   . Smokeless tobacco: Not on file  . Alcohol Use: No    OB History   Grav Para Term Preterm Abortions TAB SAB Ect Mult Living                   Review of Systems  HENT: Positive for trouble swallowing (able to swallow but feels a "lump" in throat when swallowing).   Eyes: Positive for discharge, redness and itching.  Skin: Negative for rash.      Allergies  Fish allergy; Other; Peanuts; Ace inhibitors; Beta adrenergic blockers; Iodine solution; Latex; Milk-related compounds; Norvasc; Penicillins; Shellfish allergy; and Sulfa antibiotics  Home Medications   Prior to Admission medications   Medication Sig Start Date End Date Taking? Authorizing Provider  aspirin 81 MG chewable tablet Chew 1 tablet (81 mg total) by mouth daily. 03/11/13   Sheila Oats, MD  azithromycin (ZITHROMAX Z-PAK) 250 MG  tablet Take 2 tabs on day 1 then 1 tab on days 2-5. 07/21/13   Rhetta Mura Schorr, NP  brimonidine (ALPHAGAN) 0.2 % ophthalmic solution Place 1 drop into both eyes 3 (three) times daily.    Historical Provider, MD  calcium carbonate (OS-CAL) 1250 MG chewable tablet Chew 2 tablets by mouth 2 (two) times daily.    Historical Provider, MD  CHELATED IRON PO Take 1 tablet by mouth daily.    Historical Provider, MD  estradiol (ESTRACE) 0.1 MG/GM vaginal cream Place 1 Applicatorful vaginally once a week.    Historical Provider, MD  latanoprost (XALATAN) 0.005 % ophthalmic solution Place 1 drop into both eyes at bedtime.    Historical Provider, MD  triamterene-hydrochlorothiazide (MAXZIDE-25) 37.5-25 MG per tablet Take 1 tablet by mouth  daily.    Historical Provider, MD  Vitamin D, Ergocalciferol, (DRISDOL) 50000 UNITS CAPS Take 5,000 Units by mouth daily.    Historical Provider, MD   BP 175/87  Pulse 65  Temp(Src) 98 F (36.7 C) (Oral)  Resp 16  SpO2 100%  Physical Exam  Nursing note and vitals reviewed. Constitutional: She appears well-developed and well-nourished. No distress.  HENT:  Head: Normocephalic and atraumatic.  Mouth/Throat: Oropharynx is clear and moist. No oropharyngeal exudate, posterior oropharyngeal edema or posterior oropharyngeal erythema.  Eyes: Conjunctivae, EOM and lids are normal. Pupils are equal, round, and reactive to light. Right eye exhibits no discharge. Left eye exhibits no discharge. No scleral icterus.  Very mild redness around eyes without edema.  Nontender.    Neck: Normal range of motion. Neck supple. No tracheal tenderness present.  No paratracheal tenderness  Cardiovascular: Normal rate, regular rhythm and normal heart sounds.   No murmur heard. Pulmonary/Chest: Effort normal and breath sounds normal. No stridor. No respiratory distress. She has no wheezes.  Lymphadenopathy:    She has no cervical adenopathy.  Neurological: She is alert.  Skin: She is not diaphoretic.  Psychiatric: Her mood appears anxious.    ED Course  Procedures (including critical care time)  DIAGNOSTIC STUDIES: Oxygen Saturation is 100% on room air, normal by my interpretation.    COORDINATION OF CARE: 5:23 PM-Discussed treatment plan which includes consult to attending physician with pt at bedside and pt agreed to plan.   5:30 PM Dr Darl Householder made aware of patient.   6:26 PM-Pt states she is feeling much improved.  Her eyes and throat feel much better and she is ready for discharge home.   Labs Review Labs Reviewed - No data to display  Imaging Review No results found.   EKG Interpretation None      MDM   Final diagnoses:  Medication reaction, initial encounter    Pt with hx multiple  allergies presents several hours after using new eye drops, concerned she might be having an allergic reaction to them.  Having burning and itching of her eyes just after placing the eye drops.  No other eye trauma or concerning symptoms.  No pain.  Pt has used these drops in the past without problem.  She will discuss medication change with her ophthalmologist.  She also had possible exposure from rental car.  No other known possible source.  Had "lump" in throat but no itching or swelling of the mouth or throat.  No airway concerns.  Pt took her own benadryl.  Was given prednisone and pepcid in ED and monitored.  Pt eventually requested d/c because her symptoms were much improved.  D/C home with same.  PCP and ophthalmology follow up.  Discussed findings, treatment, and follow up  with patient.  Pt given return precautions.  Pt verbalizes understanding and agrees with plan.        I personally performed the services described in this documentation, which was scribed in my presence. The recorded information has been reviewed and is accurate.    Karla Bibles, PA-C 03/22/14 1956

## 2014-03-22 NOTE — Discharge Instructions (Signed)
Read the information below.  Use the prescribed medication as directed.  Please discuss all new medications with your pharmacist.  You may return to the Emergency Department at any time for worsening condition or any new symptoms that concern you.  If you develop itchy rash, difficulty swallowing or breathing, or you are unable to tolerate fluids by mouth, return to the ER immediately for a recheck.    If you develop worsening discomfort in your eyes, change in your vision, swelling around your eye, difficulty moving your eye, or fevers greater than 100.4, see your eye doctor or return to the Emergency Department immediately for a recheck.

## 2014-03-22 NOTE — ED Notes (Signed)
Presents with redness to eyes began after taking brimonidine eye drops at 12:30 today, pt also reports slight scratchiness to throat. She is renting a car and worried that she is either having a reaction to the eye drops, the car rental. She has a nut allergy and uses a epi pen. Speaking in full sentences, airway intact. sats 100%. Denies SOB.

## 2014-05-06 ENCOUNTER — Ambulatory Visit (HOSPITAL_BASED_OUTPATIENT_CLINIC_OR_DEPARTMENT_OTHER): Payer: Medicare Other | Attending: Family Medicine | Admitting: Radiology

## 2014-05-06 VITALS — Ht <= 58 in | Wt 110.0 lb

## 2014-05-06 DIAGNOSIS — G4733 Obstructive sleep apnea (adult) (pediatric): Secondary | ICD-10-CM | POA: Diagnosis present

## 2014-05-11 DIAGNOSIS — G4733 Obstructive sleep apnea (adult) (pediatric): Secondary | ICD-10-CM

## 2014-05-11 NOTE — Sleep Study (Signed)
   NAME: Karla Richards DATE OF BIRTH:  July 29, 1946 MEDICAL RECORD NUMBER [id number]  LOCATION: [facility]  PHYSICIAN: Latosha Gaylord D  DATE OF STUDY: 05/06/2014  SLEEP STUDY TYPE: Nocturnal Polysomnogram               REFERRING PHYSICIAN: Ricke Hey, MD  INDICATION FOR STUDY:  Hypersomnia with sleep apnea  EPWORTH SLEEPINESS SCORE:   12/24 HEIGHT: 4\' 10"  (147.3 cm)  WEIGHT: 49.896 kg (110 lb)    Body mass index is 23 kg/(m^2).  NECK SIZE: 12.5 in.  MEDICATIONS: Charted for review  SLEEP ARCHITECTURE: Total sleep time 348 minutes with sleep efficiency 94.1%. Stage I was 7%, stage II 74.6%, stage III absent, REM 18.4% of total sleep time. Sleep latency 8.5 minutes, REM latency 75 minutes, awake after sleep onset 13.5 minutes, arousal index 20.5, bedtime medication: Benadryl, Pepcid, Alphagan  RESPIRATORY DATA: Apnea hypopneas index (AHI) 6.4 per hour. 37 total events scored including 10 obstructive apneas, 1 central apnea, 2 mixed apneas, 24 hypopneas. All events were while supine. REM AHI 30 per hour. There were not enough events to permit application of split protocol CPAP titration.  OXYGEN DATA: Moderate snoring with oxygen desaturation to a nadir of 87% and mean saturation 96.3% on room air.  CARDIAC DATA: Normal sinus rhythm  MOVEMENT/PARASOMNIA: 16 total limb jerks were counted of which 5 were associated with arousals or awakenings for a periodic limb movement with arousal index of 0.9 per hour. Bathroom x1  IMPRESSION/ RECOMMENDATION:   1) Mild obstructive sleep apnea/hypopneas syndrome, AHI 6.4 per hour. Supine events. REM AHI 30 per hour. Moderate snoring with oxygen desaturation to a nadir of 87% on room. 2) There were not enough events to meet protocol requirements for split CPAP titration on this study. If a decision is made to treat, scores in this range are often addressed with conservative measures such as weight loss, management  of nasal stuffiness and encouragement to sleep off flat of back, where appropriate. An oral appliance or CPAP may be appropriate on an individual basis.   Deneise Lever Diplomate, American Board of Sleep Medicine  ELECTRONICALLY SIGNED ON:  05/11/2014, 2:59 PM [place] PH: [phone]   FX: [phone] [place]

## 2014-08-20 DIAGNOSIS — L03039 Cellulitis of unspecified toe: Secondary | ICD-10-CM | POA: Diagnosis not present

## 2014-08-20 DIAGNOSIS — B351 Tinea unguium: Secondary | ICD-10-CM | POA: Diagnosis not present

## 2014-08-20 DIAGNOSIS — I10 Essential (primary) hypertension: Secondary | ICD-10-CM | POA: Diagnosis not present

## 2014-08-20 DIAGNOSIS — B9789 Other viral agents as the cause of diseases classified elsewhere: Secondary | ICD-10-CM | POA: Diagnosis not present

## 2014-09-04 DIAGNOSIS — H2513 Age-related nuclear cataract, bilateral: Secondary | ICD-10-CM | POA: Diagnosis not present

## 2014-09-04 DIAGNOSIS — H4011X3 Primary open-angle glaucoma, severe stage: Secondary | ICD-10-CM | POA: Diagnosis not present

## 2014-10-09 ENCOUNTER — Emergency Department (HOSPITAL_COMMUNITY): Payer: Medicare Other

## 2014-10-09 ENCOUNTER — Encounter (HOSPITAL_COMMUNITY): Payer: Self-pay | Admitting: Emergency Medicine

## 2014-10-09 ENCOUNTER — Observation Stay (HOSPITAL_COMMUNITY)
Admission: EM | Admit: 2014-10-09 | Discharge: 2014-10-10 | Disposition: A | Discharge status: Home / Self Care | Payer: Medicare Other | Attending: Internal Medicine | Admitting: Internal Medicine

## 2014-10-09 DIAGNOSIS — Z91030 Bee allergy status: Secondary | ICD-10-CM | POA: Diagnosis not present

## 2014-10-09 DIAGNOSIS — R531 Weakness: Secondary | ICD-10-CM | POA: Diagnosis not present

## 2014-10-09 DIAGNOSIS — R42 Dizziness and giddiness: Secondary | ICD-10-CM | POA: Diagnosis not present

## 2014-10-09 DIAGNOSIS — I2510 Atherosclerotic heart disease of native coronary artery without angina pectoris: Secondary | ICD-10-CM | POA: Diagnosis not present

## 2014-10-09 DIAGNOSIS — Z91011 Allergy to milk products: Secondary | ICD-10-CM | POA: Insufficient documentation

## 2014-10-09 DIAGNOSIS — Z886 Allergy status to analgesic agent status: Secondary | ICD-10-CM | POA: Insufficient documentation

## 2014-10-09 DIAGNOSIS — I639 Cerebral infarction, unspecified: Secondary | ICD-10-CM

## 2014-10-09 DIAGNOSIS — R51 Headache: Secondary | ICD-10-CM | POA: Diagnosis not present

## 2014-10-09 DIAGNOSIS — Z883 Allergy status to other anti-infective agents status: Secondary | ICD-10-CM | POA: Insufficient documentation

## 2014-10-09 DIAGNOSIS — Z79899 Other long term (current) drug therapy: Secondary | ICD-10-CM | POA: Diagnosis not present

## 2014-10-09 DIAGNOSIS — H409 Unspecified glaucoma: Secondary | ICD-10-CM | POA: Insufficient documentation

## 2014-10-09 DIAGNOSIS — G459 Transient cerebral ischemic attack, unspecified: Secondary | ICD-10-CM | POA: Diagnosis present

## 2014-10-09 DIAGNOSIS — Z888 Allergy status to other drugs, medicaments and biological substances status: Secondary | ICD-10-CM | POA: Insufficient documentation

## 2014-10-09 DIAGNOSIS — Z91040 Latex allergy status: Secondary | ICD-10-CM | POA: Insufficient documentation

## 2014-10-09 DIAGNOSIS — Z882 Allergy status to sulfonamides status: Secondary | ICD-10-CM | POA: Insufficient documentation

## 2014-10-09 DIAGNOSIS — E876 Hypokalemia: Secondary | ICD-10-CM | POA: Diagnosis not present

## 2014-10-09 DIAGNOSIS — Z880 Allergy status to penicillin: Secondary | ICD-10-CM | POA: Diagnosis not present

## 2014-10-09 DIAGNOSIS — H538 Other visual disturbances: Secondary | ICD-10-CM | POA: Insufficient documentation

## 2014-10-09 DIAGNOSIS — I10 Essential (primary) hypertension: Secondary | ICD-10-CM | POA: Diagnosis not present

## 2014-10-09 DIAGNOSIS — R404 Transient alteration of awareness: Secondary | ICD-10-CM | POA: Diagnosis not present

## 2014-10-09 HISTORY — DX: Atherosclerotic heart disease of native coronary artery without angina pectoris: I25.10

## 2014-10-09 LAB — PROTIME-INR
INR: 1.01 (ref 0.00–1.49)
PROTHROMBIN TIME: 13.4 s (ref 11.6–15.2)

## 2014-10-09 LAB — CBC
HEMATOCRIT: 37.8 % (ref 36.0–46.0)
Hemoglobin: 12.3 g/dL (ref 12.0–15.0)
MCH: 30.1 pg (ref 26.0–34.0)
MCHC: 32.5 g/dL (ref 30.0–36.0)
MCV: 92.6 fL (ref 78.0–100.0)
PLATELETS: 193 10*3/uL (ref 150–400)
RBC: 4.08 MIL/uL (ref 3.87–5.11)
RDW: 13.9 % (ref 11.5–15.5)
WBC: 5.5 10*3/uL (ref 4.0–10.5)

## 2014-10-09 LAB — COMPREHENSIVE METABOLIC PANEL
ALBUMIN: 3.9 g/dL (ref 3.5–5.2)
ALK PHOS: 66 U/L (ref 39–117)
ALT: 22 U/L (ref 0–35)
AST: 34 U/L (ref 0–37)
Anion gap: 9 (ref 5–15)
BILIRUBIN TOTAL: 0.6 mg/dL (ref 0.3–1.2)
BUN: 7 mg/dL (ref 6–23)
CHLORIDE: 101 mmol/L (ref 96–112)
CO2: 26 mmol/L (ref 19–32)
Calcium: 9.5 mg/dL (ref 8.4–10.5)
Creatinine, Ser: 1.1 mg/dL (ref 0.50–1.10)
GFR calc Af Amer: 58 mL/min — ABNORMAL LOW (ref >90)
GFR calc non Af Amer: 50 mL/min — ABNORMAL LOW (ref >90)
Glucose, Bld: 119 mg/dL — ABNORMAL HIGH (ref 70–99)
POTASSIUM: 3.1 mmol/L — AB (ref 3.5–5.1)
Sodium: 136 mmol/L (ref 135–145)
Total Protein: 7 g/dL (ref 6.0–8.3)

## 2014-10-09 LAB — I-STAT CHEM 8, ED
BUN: 9 mg/dL (ref 6–23)
CREATININE: 1 mg/dL (ref 0.50–1.10)
Calcium, Ion: 1.19 mmol/L (ref 1.13–1.30)
Chloride: 102 mmol/L (ref 96–112)
GLUCOSE: 113 mg/dL — AB (ref 70–99)
HCT: 40 % (ref 36.0–46.0)
HEMOGLOBIN: 13.6 g/dL (ref 12.0–15.0)
Potassium: 3.3 mmol/L — ABNORMAL LOW (ref 3.5–5.1)
SODIUM: 141 mmol/L (ref 135–145)
TCO2: 22 mmol/L (ref 0–100)

## 2014-10-09 LAB — DIFFERENTIAL
BASOS ABS: 0 10*3/uL (ref 0.0–0.1)
BASOS PCT: 0 % (ref 0–1)
EOS ABS: 0.1 10*3/uL (ref 0.0–0.7)
EOS PCT: 1 % (ref 0–5)
Lymphocytes Relative: 34 % (ref 12–46)
Lymphs Abs: 1.8 10*3/uL (ref 0.7–4.0)
MONOS PCT: 8 % (ref 3–12)
Monocytes Absolute: 0.5 10*3/uL (ref 0.1–1.0)
Neutro Abs: 3.1 10*3/uL (ref 1.7–7.7)
Neutrophils Relative %: 57 % (ref 43–77)

## 2014-10-09 LAB — I-STAT TROPONIN, ED: TROPONIN I, POC: 0.01 ng/mL (ref 0.00–0.08)

## 2014-10-09 LAB — TROPONIN I: Troponin I: <0.03 ng/mL (ref <0.031)

## 2014-10-09 LAB — APTT: APTT: 31 s (ref 24–37)

## 2014-10-09 LAB — CBG MONITORING, ED: GLUCOSE-CAPILLARY: 112 mg/dL — AB (ref 70–99)

## 2014-10-09 MED ORDER — SODIUM CHLORIDE 0.9 % IV SOLN
INTRAVENOUS | Status: DC
  Administered 2014-10-10: via INTRAVENOUS

## 2014-10-09 MED ORDER — FAMOTIDINE 20 MG PO TABS
20.0000 mg | ORAL_TABLET | Freq: Two times a day (BID) | ORAL | Status: DC
  Filled 2014-10-09 (×3): qty 1

## 2014-10-09 MED ORDER — ACETAMINOPHEN 325 MG PO TABS: 650.0000 mg | ORAL_TABLET | Freq: Four times a day (QID) | ORAL | Status: DC | PRN

## 2014-10-09 MED ORDER — ASPIRIN 81 MG PO CHEW
81.0000 mg | CHEWABLE_TABLET | Freq: Every day | ORAL | Status: DC
  Filled 2014-10-09 (×2): qty 1

## 2014-10-09 MED ORDER — SODIUM CHLORIDE 0.9 % IJ SOLN
3.0000 mL | Freq: Two times a day (BID) | INTRAMUSCULAR | Status: DC
  Administered 2014-10-10 (×2): 3 mL via INTRAVENOUS

## 2014-10-09 MED ORDER — ACETAMINOPHEN 650 MG RE SUPP: 650.0000 mg | Freq: Four times a day (QID) | RECTAL | Status: DC | PRN

## 2014-10-09 MED ORDER — HEPARIN SODIUM (PORCINE) 5000 UNIT/ML IJ SOLN
5000.0000 [IU] | Freq: Three times a day (TID) | INTRAMUSCULAR | Status: DC
  Filled 2014-10-09 (×3): qty 1

## 2014-10-09 MED ORDER — POTASSIUM CHLORIDE CRYS ER 20 MEQ PO TBCR: 40.0000 meq | EXTENDED_RELEASE_TABLET | Freq: Once | ORAL | Status: DC

## 2014-10-09 MED ORDER — LORAZEPAM 2 MG/ML IJ SOLN
1.0000 mg | Freq: Once | INTRAMUSCULAR | Status: AC
  Administered 2014-10-09: 1 mg via INTRAVENOUS
  Filled 2014-10-09: qty 1

## 2014-10-09 NOTE — ED Provider Notes (Signed)
CSN: [id number]     Arrival date & time 10/09/14  1856 History   First MD Initiated Contact with Patient 10/09/14 1859     Chief Complaint  Patient presents with  . Code Stroke  . Dizziness  . Headache    An emergency department physician performed an initial assessment on this suspected stroke patient at 31. (Consider location/radiation/quality/duration/timing/severity/associated sxs/prior Treatment) Patient is a 69 y.o. female presenting with dizziness and headaches. The history is provided by the patient (the pt states at 530pm she became dizzy and had problems walking but did not fall).  Dizziness Quality:  Lightheadedness Severity:  Moderate Onset quality:  Sudden Timing:  Constant Progression:  Waxing and waning Chronicity:  New Context: not when bending over   Relieved by:  Nothing Worsened by:  Nothing Associated symptoms: headaches   Associated symptoms: no chest pain and no diarrhea   Headache Associated symptoms: dizziness   Associated symptoms: no abdominal pain, no back pain, no congestion, no cough, no diarrhea, no fatigue, no seizures and no sinus pressure     Past Medical History  Diagnosis Date  . Hypertension   . Glaucoma   . Genital herpes    Past Surgical History  Procedure Laterality Date  . Cesarean section    . Coronary catherization     Family History  Problem Relation Age of Onset  . Stroke Maternal Grandmother 50  . Heart attack Mother 20   History  Substance Use Topics  . Smoking status: Never Smoker   . Smokeless tobacco: Not on file  . Alcohol Use: No   OB History    No data available     Review of Systems  Constitutional: Negative for appetite change and fatigue.  HENT: Negative for congestion, ear discharge and sinus pressure.   Eyes: Negative for discharge.  Respiratory: Negative for cough.   Cardiovascular: Negative for chest pain.  Gastrointestinal: Negative for abdominal pain and diarrhea.  Genitourinary: Negative for  frequency and hematuria.  Musculoskeletal: Negative for back pain.  Skin: Negative for rash.  Neurological: Positive for dizziness and headaches. Negative for seizures.  Psychiatric/Behavioral: Negative for hallucinations.      Allergies  Fish allergy; Other; Peanuts; Ace inhibitors; Aspirin; Beta adrenergic blockers; Iodine solution; Latex; Milk-related compounds; Norvasc; Penicillins; Shellfish allergy; and Sulfa antibiotics  Home Medications   Prior to Admission medications   Medication Sig Start Date End Date Taking? Authorizing Provider  calcium carbonate (OS-CAL) 1250 MG chewable tablet Chew 2 tablets by mouth 2 (two) times daily.   Yes Historical Provider, MD  CHELATED IRON PO Take 1 tablet by mouth daily.   Yes Historical Provider, MD  diphenhydrAMINE (BENADRYL) 25 MG tablet Take 1 tablet (25 mg total) by mouth every 6 (six) hours. X 3 days then PRN allergic reaction 03/22/14  Yes [name], PA-C  famotidine (PEPCID) 20 MG tablet Take 1 tablet (20 mg total) by mouth 2 (two) times daily. X 3 days then PRN allergic reaction 03/22/14  Yes [name], PA-C  Tafluprost 0.0015 % SOLN Apply 1 drop to eye daily.   Yes Historical Provider, MD  triamterene-hydrochlorothiazide (MAXZIDE-25) 37.5-25 MG per tablet Take 1 tablet by mouth daily.   Yes Historical Provider, MD  VITAMIN D, ERGOCALCIFEROL, PO Take 1 tablet by mouth 2 (two) times daily.   Yes Historical Provider, MD  aspirin 81 MG chewable tablet Chew 1 tablet (81 mg total) by mouth daily. Patient not taking: Reported on 10/09/2014 03/11/13   Adeline C  Viyuoh, MD  azithromycin (ZITHROMAX Z-PAK) 250 MG tablet Take 2 tabs on day 1 then 1 tab on days 2-5. Patient not taking: Reported on 10/09/2014 07/21/13   Rhetta Mura Schorr, NP  predniSONE (STERAPRED UNI-PAK) 10 MG tablet Take by mouth daily. Day 1: take 6 tabs.  Day 2: 5 tabs  Day 3: 4 tabs  Day 4: 3 tabs  Day 5: 2 tabs  Day 6: 1 tab Patient not taking: Reported on 10/09/2014 03/22/14   [name], PA-C   BP 116/62 mmHg  Pulse 69  Temp(Src) 98.4 F (36.9 C) (Oral)  Resp 16  Ht 4\' 11"  (1.499 m)  Wt 119 lb (53.978 kg)  BMI 24.02 kg/m2  SpO2 96% Physical Exam  Constitutional: She is oriented to person, place, and time. She appears well-developed.  HENT:  Head: Normocephalic.  Eyes: Conjunctivae and EOM are normal. No scleral icterus.  Neck: Neck supple. No thyromegaly present.  Cardiovascular: Normal rate and regular rhythm.  Exam reveals no gallop and no friction rub.   No murmur heard. Pulmonary/Chest: No stridor. She has no wheezes. She has no rales. She exhibits no tenderness.  Abdominal: She exhibits no distension. There is no tenderness. There is no rebound.  Musculoskeletal: Normal range of motion. She exhibits no edema.  Lymphadenopathy:    She has no cervical adenopathy.  Neurological: She is oriented to person, place, and time. She exhibits normal muscle tone.  Ataxia   Skin: No rash noted. No erythema.  Psychiatric: She has a normal mood and affect. Her behavior is normal.    ED Course  Procedures (including critical care time) Labs Review Labs Reviewed  COMPREHENSIVE METABOLIC PANEL - Abnormal; Notable for the following:    Potassium 3.1 (*)    Glucose, Bld 119 (*)    GFR calc non Af Amer 50 (*)    GFR calc Af Amer 58 (*)    All other components within normal limits  I-STAT CHEM 8, ED - Abnormal; Notable for the following:    Potassium 3.3 (*)    Glucose, Bld 113 (*)    All other components within normal limits  CBG MONITORING, ED - Abnormal; Notable for the following:    Glucose-Capillary 112 (*)    All other components within normal limits  TROPONIN I  PROTIME-INR  APTT  CBC  DIFFERENTIAL  I-STAT CHEM 8, ED  I-STAT TROPOININ, ED    Imaging Review Ct Head Wo Contrast  10/09/2014   CLINICAL DATA:  Garbled speech x1 week, vertigo  EXAM: CT HEAD WITHOUT CONTRAST  TECHNIQUE: Contiguous axial images were obtained from the base of the skull  through the vertex without intravenous contrast.  COMPARISON:  MRI brain dated 03/10/2013  FINDINGS: No evidence of parenchymal hemorrhage or extra-axial fluid collection. No mass lesion, mass effect, or midline shift.  No CT evidence of acute infarction.  Cerebral volume is within normal limits.  No ventriculomegaly.  The visualized paranasal sinuses are essentially clear. The mastoid air cells are unopacified.  No evidence of calvarial fracture.  IMPRESSION: Normal head CT.  These results were called by telephone at the time of interpretation on 10/09/2014 at 7:42 pm to Dr. Armida Sans, who verbally acknowledged these results.   Electronically Signed   By: Julian Hy M.D.   On: 10/09/2014 19:42   Mr Brain Wo Contrast  10/09/2014   CLINICAL DATA:  Initial evaluation for acute onset dizziness, blurry vision.  EXAM: MRI HEAD WITHOUT CONTRAST  TECHNIQUE: Multiplanar,  multiecho pulse sequences of the brain and surrounding structures were obtained without intravenous contrast.  COMPARISON:  Prior CT from earlier the same day.  FINDINGS: The CSF containing spaces are within normal limits for patient age. Few small subcentimeter T2/FLAIR hyperintense foci are noted within the periventricular and deep white matter both cerebral hemispheres, nonspecific, but most likely related to mild chronic small vessel ischemic disease. No mass lesion, midline shift, or extra-axial fluid collection. Ventricles are normal in size without evidence of hydrocephalus.  No diffusion-weighted signal abnormality is identified to suggest acute intracranial infarct. Gray-white matter differentiation is maintained. Normal flow voids are seen within the intracranial vasculature. No intracranial hemorrhage identified.  The cervicomedullary junction is normal. Scattered degenerative changes noted within the visualized upper cervical spine. Pituitary gland is within normal limits. Pituitary stalk is midline. The globes and optic nerves demonstrate  a normal appearance with normal signal intensity.  The bone marrow signal intensity is normal. Calvarium is intact. Visualized upper cervical spine is within normal limits.  Scalp soft tissues are unremarkable.  Minimal mucoperiosteal thickening present within the maxillary sinuses bilaterally. No air-fluid levels to suggest active sinus infection. No mastoid effusion.  IMPRESSION: 1. No acute intracranial infarct or other abnormality identified. 2. Mild chronic small vessel ischemic changes involving the supratentorial white matter.   Electronically Signed   By: Jeannine Boga M.D.   On: 10/09/2014 21:44   Dg Chest Port 1 View  10/09/2014   CLINICAL DATA:  Generalized weakness.  ,  EXAM: PORTABLE CHEST - 1 VIEW  COMPARISON:  None.  FINDINGS: Normal mediastinum and cardiac silhouette. Normal pulmonary vasculature. No evidence of effusion, infiltrate, or pneumothorax. No acute bony abnormality.  IMPRESSION: No acute cardiopulmonary process.   Electronically Signed   By: Suzy Bouchard M.D.   On: 10/09/2014 20:11     EKG Interpretation None    nsr  Rate 75  MDM   Final diagnoses:  Stroke  Weakness    Seen by neuro,  rec mri which was neg,  And obs admission   Maudry Diego, MD 10/09/14 2246

## 2014-10-09 NOTE — ED Notes (Signed)
Pt arrived by [facility] from home with c/o generalized weakness and dizziness that started between 1800-1830. Pt stated that for the past couple of days she has had a hard time getting her words out. Per EMS pt has equal grip strengths and stroke scale is negative. Pt has been c/o left sided headache that started after arrival to ED around 1905. Pt also a little warm to touch. Was told my PCP that she has a depressed immune system, fluid in ears, glaucoma, and cataracts. Pt stated that she noticed 1 hr prior to episode that she felt like she had a hard time focusing with her eyes. Stated that she is unsure of when she ate last and that she has not had a lot of water today.

## 2014-10-09 NOTE — Progress Notes (Signed)
Received report from [place], RN in ED.

## 2014-10-09 NOTE — ED Notes (Signed)
Activated Code Stroke 

## 2014-10-09 NOTE — Consult Note (Signed)
Referring Physician: ED    Chief Complaint: dizziness, blurred vision, HA  HPI:                                                                                                                                         Karla Richards Richards is an 69 y.o. female with a past medical history significant for HTN, glaucoma, brought in via EMS as a code stroke due to acute onset of the above stated symptoms. Stated that she was home and around 5:30 PM developed dizziness, difficulty focusing, and blurred vision. Said that approximately 1 hour later the dizziness was more noticeable " but was not a spinning or lightheadedness sensation, I can not really explained it". She became very anxious before going for CTA brain and then started having " head pressure, no really a headcahe". Denies double vision, focal weakness, slurred speech, language or vision impairment. NIHSS 1. CT brain reviewed by myself showed no acute intracranial abnormality. Karla Richards Richards said that she had a " similar type of dizziness" when she was seen at [facility] approximately a month ago. Date last known well: 10/09/14 Time last known well: 5:30 PM tPA Given: no, minimal deficit NIHSS: 1 (senrory right face) MRS: 0  Past Medical History  Diagnosis Date  . Hypertension   . Glaucoma   . Genital herpes     Past Surgical History  Procedure Laterality Date  . Cesarean section    . Coronary catherization      Family History  Problem Relation Age of Onset  . Stroke Maternal Grandmother 50  . Heart attack Mother 23   Social History:  reports that she has never smoked. She does not have any smokeless tobacco history on file. She reports that she does not drink alcohol or use illicit drugs. Family history: no epilepsy, brain tumors, or brain aneurysms. Allergies:  Allergies  Allergen Reactions  . Fish Allergy Anaphylaxis  . Other Anaphylaxis    Tree nuts  . Peanuts [Peanut Oil] Anaphylaxis  . Ace Inhibitors   . Beta Adrenergic  Blockers   . Iodine Solution [Povidone Iodine] Itching    Topical solution caused severe itching  . Latex Itching  . Milk-Related Compounds     Fluid in the ear and dizziness   . Norvasc [Amlodipine] Hives and Swelling  . Penicillins Itching  . Shellfish Allergy Swelling  . Sulfa Antibiotics     Can take with Benadryl    Medications:  I have reviewed the patient's current medications.  ROS:                                                                                                                                       History obtained from the patient  General ROS: negative for - chills, fatigue, fever, night sweats, weight gain or weight loss Psychological ROS: negative for - behavioral disorder, hallucinations, memory difficulties, mood swings or suicidal ideation Ophthalmic ROS: negative for - blurry vision, double vision, eye pain or loss of vision ENT ROS: negative for - epistaxis, nasal discharge, oral lesions, sore throat, tinnitus or vertigo Allergy and Immunology ROS: negative for - hives or itchy/watery eyes Hematological and Lymphatic ROS: negative for - bleeding problems, bruising or swollen lymph nodes Endocrine ROS: negative for - galactorrhea, hair pattern changes, polydipsia/polyuria or temperature intolerance Respiratory ROS: negative for - cough, hemoptysis, shortness of breath or wheezing Cardiovascular ROS: negative for - chest pain, dyspnea on exertion, edema or irregular heartbeat Gastrointestinal ROS: negative for - abdominal pain, diarrhea, hematemesis, nausea/vomiting or stool incontinence Genito-Urinary ROS: negative for - dysuria, hematuria, incontinence or urinary frequency/urgency Musculoskeletal ROS: negative for - joint swelling or muscular weakness Neurological ROS: as noted in HPI Dermatological ROS: negative for rash and skin  lesion changes   Physical exam: pleasant female in no apparent distress. Blood pressure 168/73, pulse 74, temperature 98.4 F (36.9 C), temperature source Oral, resp. rate 14, height 4\' 11"  (1.499 m), weight 53.978 kg (119 lb), SpO2 100 %. Head: normocephalic. Neck: supple, no bruits, no JVD. Cardiac: no murmurs. Lungs: clear. Abdomen: soft, no tender, no mass. Extremities: no edema. Skin: no edema  Neurologic Examination:                                                                                                      General: Mental Status: Alert, oriented, thought content appropriate.  Speech fluent without evidence of aphasia.  Able to follow 3 step commands without difficulty. Cranial Nerves: II: Discs flat bilaterally; Visual fields grossly normal, pupils equal, round, reactive to light and accommodation III,IV, VI: ptosis not present, extra-ocular motions intact bilaterally V,VII: smile symmetric, facial light touch sensation normal bilaterally VIII: hearing normal bilaterally IX,X: gag reflex present XI: bilateral shoulder shrug XII: midline tongue extension without atrophy or fasciculations Motor: Right : Upper extremity   5/5    Left:     Upper extremity   5/5  Lower extremity   5/5  Lower extremity   5/5 Tone and bulk:normal tone throughout; no atrophy noted Sensory: Pinprick and light touch slightly diminished right face Deep Tendon Reflexes:  Right: Upper Extremity   Left: Upper extremity   biceps (C-5 to C-6) 2/4   biceps (C-5 to C-6) 2/4 tricep (C7) 2/4    triceps (C7) 2/4 Brachioradialis (C6) 2/4  Brachioradialis (C6) 2/4  Lower Extremity Lower Extremity  quadriceps (L-2 to L-4) 2/4   quadriceps (L-2 to L-4) 2/4 Achilles (S1) 2/4   Achilles (S1) 2/4  Plantars: Right: downgoing   Left: downgoing Cerebellar: normal finger-to-nose,  normal heel-to-shin test Gait:  Deferred for safety reasons    Results for orders placed or performed during the  hospital encounter of 10/09/14 (from the past 48 hour(s))  CBG monitoring, ED     Status: Abnormal   Collection Time: 10/09/14  7:50 PM  Result Value Ref Range   Glucose-Capillary 112 (H) 70 - 99 mg/dL   Ct Head Wo Contrast  10/09/2014   CLINICAL DATA:  Garbled speech x1 week, vertigo  EXAM: CT HEAD WITHOUT CONTRAST  TECHNIQUE: Contiguous axial images were obtained from the base of the skull through the vertex without intravenous contrast.  COMPARISON:  MRI brain dated 03/10/2013  FINDINGS: No evidence of parenchymal hemorrhage or extra-axial fluid collection. No mass lesion, mass effect, or midline shift.  No CT evidence of acute infarction.  Cerebral volume is within normal limits.  No ventriculomegaly.  The visualized paranasal sinuses are essentially clear. The mastoid air cells are unopacified.  No evidence of calvarial fracture.  IMPRESSION: Normal head CT.  These results were called by telephone at the time of interpretation on 10/09/2014 at 7:42 pm to Dr. Armida Sans, who verbally acknowledged these results.   Electronically Signed   By: Julian Hy M.D.   On: 10/09/2014 19:42    Assessment: 69 y.o. female brought in as a code stroke due to acute onset dizziness, blurred vision. NIHSS 1 for mildly decreased sensation right face. CT brain without acute abnormality. Of course, she is not a candidate for IV thrombolysis and I am not quite sure she had sustained an acute cerebrovascular insult. Will suggest getting MRI brain and observing her overnight. Further stroke work up only if MRI positive for stroke. Can give her aspirin 81 mg after passing swallowing evaluation.  Stroke Risk Factors - age, HTN   Dorian Pod ,MD [facility] [phone]  10/09/2014, 7:57 PM

## 2014-10-09 NOTE — ED Notes (Addendum)
Pt c/o left ear pain.  Pt also sts she lost her balance in MRI while using the bathroom and bumped her head on the wall.  No active bleeding or bump to posterior head noted. MD made aware.

## 2014-10-09 NOTE — H&P (Signed)
[facility] History and Physical  Karla Richards QQI:[id number] DOB: May 11, 1946 DOA: 10/09/2014  Referring physician: Dr. Roderic Palau PCP: Ricke Hey, MD   Chief Complaint: Difficulty with ambulation due to dizziness  HPI: Karla Richards is a 69 y.o. female  Who presented to the ED complaining of dizziness with ambulation. Reportedly this occurred today around 5:30 PM. The patient is not sure whether describe it as lightheadedness or room spinning states the little both. The problem came on all of a sudden and was constant, waxing and waning over time. She is not sure what made it better or worse. Given persistent dizziness patient presented to the ED for further evaluation. Neurology was consulted and patient obtain MRI of brain which was negative for stroke.  We were consulted for further medical evaluation recommendations   Review of Systems:  Constitutional:  No weight loss, night sweats, Fevers, chills, fatigue.  HEENT:  No headaches, Difficulty swallowing,Tooth/dental problems,Sore throat,  No sneezing, itching, ear ache, nasal congestion, post nasal drip,  Cardio-vascular:  No chest pain, Orthopnea, PND, swelling in lower extremities, anasarca,+ dizziness, palpitations  GI:  No heartburn, indigestion, abdominal pain, nausea, vomiting, diarrhea, change in bowel habits, loss of appetite  Resp:  No shortness of breath with exertion or at rest. No excess mucus, no productive cough, No non-productive cough, No coughing up of blood.No change in color of mucus.No wheezing.No chest wall deformity  Skin:  no rash or lesions.  GU:  no dysuria, change in color of urine, no urgency or frequency. No flank pain.  Musculoskeletal:  No joint pain or swelling. No decreased range of motion. No back pain.  Psych:  No change in mood or affect. No depression or anxiety. No memory loss.   Past Medical History  Diagnosis Date  . Hypertension   . Glaucoma   . Genital herpes     Past Surgical History  Procedure Laterality Date  . Cesarean section    . Coronary catherization     Social History:  reports that she has never smoked. She does not have any smokeless tobacco history on file. She reports that she does not drink alcohol or use illicit drugs.  Allergies  Allergen Reactions  . Fish Allergy Anaphylaxis  . Other Anaphylaxis    Tree nuts  . Peanuts [Peanut Oil] Anaphylaxis  . Ace Inhibitors   . Aspirin     Abdominal irritation  . Beta Adrenergic Blockers   . Iodine Solution [Povidone Iodine] Itching    Topical solution caused severe itching  . Latex Itching  . Milk-Related Compounds     Fluid in the ear and dizziness   . Norvasc [Amlodipine] Hives and Swelling  . Penicillins Itching  . Shellfish Allergy Swelling  . Sulfa Antibiotics     Can take with Benadryl    Family History  Problem Relation Age of Onset  . Stroke Maternal Grandmother 50  . Heart attack Mother 75    Prior to Admission medications   Medication Sig Start Date End Date Taking? Authorizing Provider  calcium carbonate (OS-CAL) 1250 MG chewable tablet Chew 2 tablets by mouth 2 (two) times daily.   Yes Historical Provider, MD  CHELATED IRON PO Take 1 tablet by mouth daily.   Yes Historical Provider, MD  diphenhydrAMINE (BENADRYL) 25 MG tablet Take 1 tablet (25 mg total) by mouth every 6 (six) hours. X 3 days then PRN allergic reaction 03/22/14  Yes [name], PA-C  famotidine (PEPCID) 20 MG tablet Take 1  tablet (20 mg total) by mouth 2 (two) times daily. X 3 days then PRN allergic reaction 03/22/14  Yes [name], PA-C  Tafluprost 0.0015 % SOLN Apply 1 drop to eye daily.   Yes Historical Provider, MD  triamterene-hydrochlorothiazide (MAXZIDE-25) 37.5-25 MG per tablet Take 1 tablet by mouth daily.   Yes Historical Provider, MD  VITAMIN D, ERGOCALCIFEROL, PO Take 1 tablet by mouth 2 (two) times daily.   Yes Historical Provider, MD  aspirin 81 MG chewable tablet Chew 1 tablet (81  mg total) by mouth daily. Patient not taking: Reported on 10/09/2014 03/11/13   Sheila Oats, MD  azithromycin (ZITHROMAX Z-PAK) 250 MG tablet Take 2 tabs on day 1 then 1 tab on days 2-5. Patient not taking: Reported on 10/09/2014 07/21/13   Rhetta Mura Schorr, NP  predniSONE (STERAPRED UNI-PAK) 10 MG tablet Take by mouth daily. Day 1: take 6 tabs.  Day 2: 5 tabs  Day 3: 4 tabs  Day 4: 3 tabs  Day 5: 2 tabs  Day 6: 1 tab Patient not taking: Reported on 10/09/2014 03/22/14   [name], PA-C   Physical Exam: Filed Vitals:   10/09/14 2230 10/09/14 2239 10/09/14 2245 10/09/14 2300  BP: 116/62 116/62 148/66 127/65  Pulse: 67 69 64 63  Temp:      TempSrc:      Resp: 14 16 18 17   Height:      Weight:      SpO2: 96% 96% 99% 97%    Wt Readings from Last 3 Encounters:  10/09/14 53.978 kg (119 lb)  05/06/14 49.896 kg (110 lb)  03/10/13 49.8 kg (109 lb 12.6 oz)    General:  Appears calm and comfortable Eyes: PERRL, normal lids, irises & conjunctiva ENT: grossly normal hearing, lips & tongue Neck: no LAD, masses or thyromegaly Cardiovascular: RRR, no m/r/g. No LE edema. Respiratory: CTA bilaterally, no w/r/r. Normal respiratory effort. Abdomen: soft, nt, nd Skin: no rash or induration seen on limited exam Musculoskeletal: grossly normal tone BUE/BLE Psychiatric: grossly normal mood and affect, speech fluent and appropriate Neurologic: No facial asymmetry, answers questions appropriately, moves extremities equally           Labs on Admission:  Basic Metabolic Panel:  Recent Labs Lab 10/09/14 1944 10/09/14 1958  NA 136 141  K 3.1* 3.3*  CL 101 102  CO2 26  --   GLUCOSE 119* 113*  BUN 7 9  CREATININE 1.10 1.00  CALCIUM 9.5  --    Liver Function Tests:  Recent Labs Lab 10/09/14 1944  AST 34  ALT 22  ALKPHOS 66  BILITOT 0.6  PROT 7.0  ALBUMIN 3.9   No results for input(s): LIPASE, AMYLASE in the last 168 hours. No results for input(s): AMMONIA in the last 168  hours. CBC:  Recent Labs Lab 10/09/14 1944 10/09/14 1958  WBC 5.5  --   NEUTROABS 3.1  --   HGB 12.3 13.6  HCT 37.8 40.0  MCV 92.6  --   PLT 193  --    Cardiac Enzymes:  Recent Labs Lab 10/09/14 1944  TROPONINI <0.03    BNP (last 3 results) No results for input(s): BNP in the last 8760 hours.  ProBNP (last 3 results) No results for input(s): PROBNP in the last 8760 hours.  CBG:  Recent Labs Lab 10/09/14 1950  GLUCAP 112*    Radiological Exams on Admission: Ct Head Wo Contrast  10/09/2014   CLINICAL DATA:  Garbled speech x1  week, vertigo  EXAM: CT HEAD WITHOUT CONTRAST  TECHNIQUE: Contiguous axial images were obtained from the base of the skull through the vertex without intravenous contrast.  COMPARISON:  MRI brain dated 03/10/2013  FINDINGS: No evidence of parenchymal hemorrhage or extra-axial fluid collection. No mass lesion, mass effect, or midline shift.  No CT evidence of acute infarction.  Cerebral volume is within normal limits.  No ventriculomegaly.  The visualized paranasal sinuses are essentially clear. The mastoid air cells are unopacified.  No evidence of calvarial fracture.  IMPRESSION: Normal head CT.  These results were called by telephone at the time of interpretation on 10/09/2014 at 7:42 pm to Dr. Armida Sans, who verbally acknowledged these results.   Electronically Signed   By: Julian Hy M.D.   On: 10/09/2014 19:42   Mr Brain Wo Contrast  10/09/2014   CLINICAL DATA:  Initial evaluation for acute onset dizziness, blurry vision.  EXAM: MRI HEAD WITHOUT CONTRAST  TECHNIQUE: Multiplanar, multiecho pulse sequences of the brain and surrounding structures were obtained without intravenous contrast.  COMPARISON:  Prior CT from earlier the same day.  FINDINGS: The CSF containing spaces are within normal limits for patient age. Few small subcentimeter T2/FLAIR hyperintense foci are noted within the periventricular and deep white matter both cerebral hemispheres,  nonspecific, but most likely related to mild chronic small vessel ischemic disease. No mass lesion, midline shift, or extra-axial fluid collection. Ventricles are normal in size without evidence of hydrocephalus.  No diffusion-weighted signal abnormality is identified to suggest acute intracranial infarct. Gray-white matter differentiation is maintained. Normal flow voids are seen within the intracranial vasculature. No intracranial hemorrhage identified.  The cervicomedullary junction is normal. Scattered degenerative changes noted within the visualized upper cervical spine. Pituitary gland is within normal limits. Pituitary stalk is midline. The globes and optic nerves demonstrate a normal appearance with normal signal intensity.  The bone marrow signal intensity is normal. Calvarium is intact. Visualized upper cervical spine is within normal limits.  Scalp soft tissues are unremarkable.  Minimal mucoperiosteal thickening present within the maxillary sinuses bilaterally. No air-fluid levels to suggest active sinus infection. No mastoid effusion.  IMPRESSION: 1. No acute intracranial infarct or other abnormality identified. 2. Mild chronic small vessel ischemic changes involving the supratentorial white matter.   Electronically Signed   By: Jeannine Boga M.D.   On: 10/09/2014 21:44   Dg Chest Port 1 View  10/09/2014   CLINICAL DATA:  Generalized weakness.  ,  EXAM: PORTABLE CHEST - 1 VIEW  COMPARISON:  None.  FINDINGS: Normal mediastinum and cardiac silhouette. Normal pulmonary vasculature. No evidence of effusion, infiltrate, or pneumothorax. No acute bony abnormality.  IMPRESSION: No acute cardiopulmonary process.   Electronically Signed   By: Suzy Bouchard M.D.   On: 10/09/2014 20:11    EKG: Independently reviewed. Sinus rhythm with no ST elevations or depressions  Assessment/Plan Active Problems:   TIA (transient ischemic attack) -Neurologist on board initially and workup negative. -  Neurology recommended aspirin after patient passed a swallowing eval and no further stroke workup given negative MRI    Dizziness, nonspecific -Place on telemetry monitoring -Neuro evaluated MRI negative for acute stroke. We'll continue to observe obtain vitamin B 12 and folate levels -Physical therapy evaluation -Up with assistance   Code Status: full DVT Prophylaxis: heparin Family Communication: discussed with patient and family member Disposition Plan: Telemetry monitoring  Time spent: > 45 minutes  Velvet Bathe [facility] Pager [phone]

## 2014-10-09 NOTE — Progress Notes (Signed)
Code Stroke calleed on 69 y.o female. While at friends home noticed acute dizziness, blurred vision with inability to focus. LSN 1730. EMS was called and code stroke was called after arrival to [facility]. Per pt dizziness became more noticeable and she developed a head ache while in ED. Pertinent history includes HTN, glaucoma, and per pt she has been told in the past she has had a "Mini stroke" but this is not listed in her EPIC history. Pt taken to CT scan stat and NIHSS completed upon arrival to ED room. NIHSS scored 1 for mild sensory deficit on right cheek. Pt for MRI tonight, to be premedicated due to severe anxiety/claustraphobia. At this time due to her mild symptoms she is not a candidate for TPA. For ED observation, admit decision pending. RN to monitor closely for now.

## 2014-10-10 DIAGNOSIS — R42 Dizziness and giddiness: Secondary | ICD-10-CM | POA: Diagnosis not present

## 2014-10-10 LAB — BASIC METABOLIC PANEL
ANION GAP: 7 (ref 5–15)
BUN: 7 mg/dL (ref 6–23)
CALCIUM: 9.2 mg/dL (ref 8.4–10.5)
CO2: 25 mmol/L (ref 19–32)
Chloride: 107 mmol/L (ref 96–112)
Creatinine, Ser: 0.98 mg/dL (ref 0.50–1.10)
GFR, EST AFRICAN AMERICAN: 66 mL/min — AB (ref >90)
GFR, EST NON AFRICAN AMERICAN: 57 mL/min — AB (ref >90)
Glucose, Bld: 102 mg/dL — ABNORMAL HIGH (ref 70–99)
POTASSIUM: 3.4 mmol/L — AB (ref 3.5–5.1)
SODIUM: 139 mmol/L (ref 135–145)

## 2014-10-10 LAB — CBC
HCT: 34.7 % — ABNORMAL LOW (ref 36.0–46.0)
Hemoglobin: 11.4 g/dL — ABNORMAL LOW (ref 12.0–15.0)
MCH: 30.2 pg (ref 26.0–34.0)
MCHC: 32.9 g/dL (ref 30.0–36.0)
MCV: 92 fL (ref 78.0–100.0)
Platelets: 189 10*3/uL (ref 150–400)
RBC: 3.77 MIL/uL — AB (ref 3.87–5.11)
RDW: 13.9 % (ref 11.5–15.5)
WBC: 5 10*3/uL (ref 4.0–10.5)

## 2014-10-10 LAB — VITAMIN B12: VITAMIN B 12: 569 pg/mL (ref 211–911)

## 2014-10-10 LAB — FOLATE

## 2014-10-10 LAB — MAGNESIUM: MAGNESIUM: 1.8 mg/dL (ref 1.5–2.5)

## 2014-10-10 MED ORDER — MECLIZINE HCL 25 MG PO TABS: 25.0000 mg | ORAL_TABLET | Freq: Three times a day (TID) | ORAL | Status: DC | PRN

## 2014-10-10 MED ORDER — POTASSIUM CHLORIDE 10 MEQ/100ML IV SOLN
10.0000 meq | INTRAVENOUS | Status: AC
  Administered 2014-10-10 (×4): 10 meq via INTRAVENOUS
  Filled 2014-10-10 (×4): qty 100

## 2014-10-10 NOTE — Progress Notes (Signed)
Discharge education completed by RN. Pt and spouse received a copy of discharge paperwork and confirm understanding of follow up appointments and discharge medications. Both deny any questions at this time. IV removed, site is within normal limits. Pt will discharge from the unit via wheelchair. 

## 2014-10-10 NOTE — Discharge Summary (Signed)
Physician Discharge Summary  Karla Richards GXQ:[id number] DOB: 04/21/1946 DOA: 10/09/2014  PCP: Ricke Hey, MD  Admit date: 10/09/2014 Discharge date: 10/10/2014  Time spent: 25 minutes  Recommendations for Outpatient Follow-up:  1. Medical optimization to reduce stroke risk factors    Discharge Diagnoses:  Active Problems:   Dizziness, nonspecific  Discharge Condition: Stable   History of present illness:  Karla Richards is a 69 year old female with a past medical history of HTN and CAD who presented to the [facility] ED on 10/09/14 after experiencing persistent dizziness and headache. Upon arrival to the ED her NIHSS score was 1 for sensory deficit on right cheek and CT head was negative for acute abnormality. Neurology was consulted who recommended MRI and overnight observation. MRI was normal and patient is now without symptoms. PT has evaluated and released patient.   Hospital Course:     Vertigo: Patient presented with dizziness and difficulty ambulating and was called code STROKE upon arrival. ED work up including CT head, troponin, EKG, CXR, and blood glucose were within normal limits. MRI was negative. PT evaluated and cleared patient. Continue low dose aspirin. Seen by Neurology, recommendations were that if MRI brain negative, then no further work up was recommended. Patient no longer complaining of dizziness. Stable for discharge, as all of her presenting complaints have essentially resolved.  Procedures:  None   Consultations:  Neurology  Discharge Exam: Filed Vitals:   10/10/14 0203 10/10/14 0414 10/10/14 0606 10/10/14 0805  BP: 147/78 145/62 151/70 170/70  Pulse: 70 58 58 53  Temp: 97.6 F (36.4 C) 97.9 F (36.6 C) 98 F (36.7 C) 98.4 F (36.9 C)  TempSrc: Oral Oral Oral Oral  Resp: 16 14 16 16   Height:      Weight:      SpO2: 100% 100% 99% 99%   Filed Weights   10/09/14 1908 10/10/14 0020  Weight: 53.978 kg (119 lb) 54.432 kg (120 lb)      General: Well developed, well nourished, NAD, appears stated age Cardiovascular: RRR, S1 and S2, no murmur, gallop, or rubs Respiratory: Clear to auscultation bilaterally  Abdominal: Soft, non-tender, non-distended, + bowel sounds Extremities: 5/5 strength in upper/ lower extremities, no evidence of cyanosis, clubbing, or edema  Discharge Instructions  Diet recommendation: Heart Healthy  Discharge Instructions    Call MD for:  difficulty breathing, headache or visual disturbances    Complete by:  As directed      Call MD for:  extreme fatigue    Complete by:  As directed      Call MD for:  persistant dizziness or light-headedness    Complete by:  As directed      Diet - low sodium heart healthy    Complete by:  As directed      Increase activity slowly    Complete by:  As directed           Current Discharge Medication List    CONTINUE these medications which have NOT CHANGED   Details  CHELATED IRON PO Take 1 tablet by mouth daily.    diphenhydrAMINE (BENADRYL) 25 MG tablet Take 1 tablet (25 mg total) by mouth every 6 (six) hours. X 3 days then PRN allergic reaction Qty: 20 tablet, Refills: 0    Tafluprost 0.0015 % SOLN Apply 1 drop to eye daily.    triamterene-hydrochlorothiazide (MAXZIDE-25) 37.5-25 MG per tablet Take 1 tablet by mouth daily.    VITAMIN D, ERGOCALCIFEROL, PO Take 1  tablet by mouth 2 (two) times daily.      STOP taking these medications     calcium carbonate (OS-CAL) 1250 MG chewable tablet      famotidine (PEPCID) 20 MG tablet      aspirin 81 MG chewable tablet      azithromycin (ZITHROMAX Z-PAK) 250 MG tablet      predniSONE (STERAPRED UNI-PAK) 10 MG tablet        Allergies  Allergen Reactions  . Fish Allergy Anaphylaxis  . Other Anaphylaxis    Tree nuts  . Peanuts [Peanut Oil] Anaphylaxis  . Ace Inhibitors   . Aspirin     Abdominal irritation  . Beta Adrenergic Blockers   . Iodine Solution [Povidone Iodine] Itching     Topical solution caused severe itching  . Latex Itching  . Milk-Related Compounds     Fluid in the ear and dizziness   . Norvasc [Amlodipine] Hives and Swelling  . Penicillins Itching  . Shellfish Allergy Swelling  . Sulfa Antibiotics     Can take with Benadryl   Follow-up Information    Follow up with Ricke Hey, MD. Schedule an appointment as soon as possible for a visit in 1 week.   Specialty:  Family Medicine   Contact information:   [place] [id number] [phone]        The results of significant diagnostics from this hospitalization (including imaging, microbiology, ancillary and laboratory) are listed below for reference.    Significant Diagnostic Studies: Ct Head Wo Contrast  10/09/2014   CLINICAL DATA:  Garbled speech x1 week, vertigo  EXAM: CT HEAD WITHOUT CONTRAST  TECHNIQUE: Contiguous axial images were obtained from the base of the skull through the vertex without intravenous contrast.  COMPARISON:  MRI brain dated 03/10/2013  FINDINGS: No evidence of parenchymal hemorrhage or extra-axial fluid collection. No mass lesion, mass effect, or midline shift.  No CT evidence of acute infarction.  Cerebral volume is within normal limits.  No ventriculomegaly.  The visualized paranasal sinuses are essentially clear. The mastoid air cells are unopacified.  No evidence of calvarial fracture.  IMPRESSION: Normal head CT.  These results were called by telephone at the time of interpretation on 10/09/2014 at 7:42 pm to Dr. Armida Sans, who verbally acknowledged these results.   Electronically Signed   By: Julian Hy M.D.   On: 10/09/2014 19:42   Mr Brain Wo Contrast  10/09/2014   CLINICAL DATA:  Initial evaluation for acute onset dizziness, blurry vision.  EXAM: MRI HEAD WITHOUT CONTRAST  TECHNIQUE: Multiplanar, multiecho pulse sequences of the brain and surrounding structures were obtained without intravenous contrast.  COMPARISON:  Prior CT from earlier  the same day.  FINDINGS: The CSF containing spaces are within normal limits for patient age. Few small subcentimeter T2/FLAIR hyperintense foci are noted within the periventricular and deep white matter both cerebral hemispheres, nonspecific, but most likely related to mild chronic small vessel ischemic disease. No mass lesion, midline shift, or extra-axial fluid collection. Ventricles are normal in size without evidence of hydrocephalus.  No diffusion-weighted signal abnormality is identified to suggest acute intracranial infarct. Gray-white matter differentiation is maintained. Normal flow voids are seen within the intracranial vasculature. No intracranial hemorrhage identified.  The cervicomedullary junction is normal. Scattered degenerative changes noted within the visualized upper cervical spine. Pituitary gland is within normal limits. Pituitary stalk is midline. The globes and optic nerves demonstrate a normal appearance with normal signal intensity.  The bone  marrow signal intensity is normal. Calvarium is intact. Visualized upper cervical spine is within normal limits.  Scalp soft tissues are unremarkable.  Minimal mucoperiosteal thickening present within the maxillary sinuses bilaterally. No air-fluid levels to suggest active sinus infection. No mastoid effusion.  IMPRESSION: 1. No acute intracranial infarct or other abnormality identified. 2. Mild chronic small vessel ischemic changes involving the supratentorial white matter.   Electronically Signed   By: Jeannine Boga M.D.   On: 10/09/2014 21:44   Dg Chest Port 1 View  10/09/2014   CLINICAL DATA:  Generalized weakness.  ,  EXAM: PORTABLE CHEST - 1 VIEW  COMPARISON:  None.  FINDINGS: Normal mediastinum and cardiac silhouette. Normal pulmonary vasculature. No evidence of effusion, infiltrate, or pneumothorax. No acute bony abnormality.  IMPRESSION: No acute cardiopulmonary process.   Electronically Signed   By: Suzy Bouchard M.D.   On:  10/09/2014 20:11    Microbiology: No results found for this or any previous visit (from the past 240 hour(s)).   Labs: Basic Metabolic Panel:  Recent Labs Lab 10/09/14 1944 10/09/14 1958 10/10/14 0110  NA 136 141 139  K 3.1* 3.3* 3.4*  CL 101 102 107  CO2 26  --  25  GLUCOSE 119* 113* 102*  BUN 7 9 7   CREATININE 1.10 1.00 0.98  CALCIUM 9.5  --  9.2  MG  --   --  1.8   Liver Function Tests:  Recent Labs Lab 10/09/14 1944  AST 34  ALT 22  ALKPHOS 66  BILITOT 0.6  PROT 7.0  ALBUMIN 3.9   CBC:  Recent Labs Lab 10/09/14 1944 10/09/14 1958 10/10/14 0110  WBC 5.5  --  5.0  NEUTROABS 3.1  --   --   HGB 12.3 13.6 11.4*  HCT 37.8 40.0 34.7*  MCV 92.6  --  92.0  PLT 193  --  189   Cardiac Enzymes:  Recent Labs Lab 10/09/14 1944  TROPONINI <0.03   CBG:  Recent Labs Lab 10/09/14 1950  GLUCAP 112*     Signed:  Raspect, Erin, PA-S [facility] 10/10/2014, 1:52 PM   Attending Patient was seen, examined,treatment plan was discussed with Ms Raspect: Stable, continue with HCTZ. Follow BP and adjust medications accordingly.  I have directly reviewed the clinical findings, lab, imaging studies and management of this patient in detail. I have made the necessary changes to the above noted documentation, and agree with the documentation, as recorded by Ms  Raspect  69 year-old female admitted for vertigo. Seen by neurology, recommended to have a MRI brain if negative no further workup required. She subsequently underwent a MRI which was indeed negative. Her symptoms have completely resolved this morning. She was seen by physical therapy and deemed not to need any further resources. She is stable for discharge, no further recommendations from neurology.  Nena Alexander MD [facility].

## 2014-10-10 NOTE — Progress Notes (Signed)
Subjective: Patient reports improvement.  Does continue to have some lightheadedness but only in certain positions.  Vision at baseline.  Objective: Current vital signs: BP 170/70 mmHg  Pulse 53  Temp(Src) 98.4 F (36.9 C) (Oral)  Resp 16  Ht 4\' 11"  (1.499 m)  Wt 54.432 kg (120 lb)  BMI 24.22 kg/m2  SpO2 99% Vital signs in last 24 hours: Temp:  [97.6 F (36.4 C)-98.4 F (36.9 C)] 98.4 F (36.9 C) (02/25 0805) Pulse Rate:  [53-75] 53 (02/25 0805) Resp:  [11-20] 16 (02/25 0805) BP: (116-170)/(62-87) 170/70 mmHg (02/25 0805) SpO2:  [96 %-100 %] 99 % (02/25 0805) Weight:  [53.978 kg (119 lb)-54.432 kg (120 lb)] 54.432 kg (120 lb) (02/25 0020)  Intake/Output from previous day: 02/24 0701 - 02/25 0700 In: 667.5 [I.V.:267.5; IV Piggyback:400] Out: 200 [Urine:200] Intake/Output this shift:   Nutritional status: Diet regular  Neurologic Exam: General: Mental Status: Alert, oriented, thought content appropriate.  Speech fluent without evidence of aphasia.  Able to follow 3 step commands without difficulty. Cranial Nerves: II: Discs flat bilaterally; Visual fields grossly normal, pupils equal, round, reactive to light and accommodation III,IV, VI: ptosis not present, extra-ocular motions intact bilaterally V,VII: smile symmetric, facial light touch sensation normal bilaterally VIII: hearing normal bilaterally IX,X: gag reflex present XI: bilateral shoulder shrug XII: midline tongue extension without atrophy or fasciculations  Motor: Right : Upper extremity   5/5    Left:     Upper extremity   5/5  Lower extremity   5/5     Lower extremity   5/5 Tone and bulk:normal tone throughout; no atrophy noted Sensory: Pinprick and light touch intact throughout, bilaterally Deep Tendon Reflexes:  Right: Upper Extremity   Left: Upper extremity   biceps (C-5 to C-6) 2/4   biceps (C-5 to C-6) 2/4 tricep (C7) 2/4    triceps (C7) 2/4 Brachioradialis (C6) 2/4  Brachioradialis (C6)  2/4  Lower Extremity Lower Extremity  quadriceps (L-2 to L-4) 2/4   quadriceps (L-2 to L-4) 2/4 Achilles (S1) 2/4   Achilles (S1) 2/4  Plantars: Right: downgoing   Left: downgoing Cerebellar: normal finger-to-nose,  normal heel-to-shin testing bilaterally    Lab Results: Basic Metabolic Panel:  Recent Labs Lab 10/09/14 1944 10/09/14 1958 10/10/14 0110  NA 136 141 139  K 3.1* 3.3* 3.4*  CL 101 102 107  CO2 26  --  25  GLUCOSE 119* 113* 102*  BUN 7 9 7   CREATININE 1.10 1.00 0.98  CALCIUM 9.5  --  9.2  MG  --   --  1.8    Liver Function Tests:  Recent Labs Lab 10/09/14 1944  AST 34  ALT 22  ALKPHOS 66  BILITOT 0.6  PROT 7.0  ALBUMIN 3.9   No results for input(s): LIPASE, AMYLASE in the last 168 hours. No results for input(s): AMMONIA in the last 168 hours.  CBC:  Recent Labs Lab 10/09/14 1944 10/09/14 1958 10/10/14 0110  WBC 5.5  --  5.0  NEUTROABS 3.1  --   --   HGB 12.3 13.6 11.4*  HCT 37.8 40.0 34.7*  MCV 92.6  --  92.0  PLT 193  --  189    Cardiac Enzymes:  Recent Labs Lab 10/09/14 1944  TROPONINI <0.03    Lipid Panel: No results for input(s): CHOL, TRIG, HDL, CHOLHDL, VLDL, LDLCALC in the last 168 hours.  CBG:  Recent Labs Lab 10/09/14 1950  GLUCAP 112*    Microbiology: Results for orders  placed or performed during the hospital encounter of 02/22/13  Urine culture     Status: None   Collection Time: 02/22/13 12:28 PM  Result Value Ref Range Status   Specimen Description URINE, CLEAN CATCH  Final   Special Requests NONE  Final   Culture  Setup Time 02/23/2013 00:24  Final   Colony Count 40,000 COLONIES/ML  Final   Culture   Final    Multiple bacterial morphotypes present, none predominant. Suggest appropriate recollection if clinically indicated.   Report Status 02/24/2013 FINAL  Final    Coagulation Studies:  Recent Labs  10/09/14 1944  LABPROT 13.4  INR 1.01    Imaging: Ct Head Wo Contrast  10/09/2014    CLINICAL DATA:  Garbled speech x1 week, vertigo  EXAM: CT HEAD WITHOUT CONTRAST  TECHNIQUE: Contiguous axial images were obtained from the base of the skull through the vertex without intravenous contrast.  COMPARISON:  MRI brain dated 03/10/2013  FINDINGS: No evidence of parenchymal hemorrhage or extra-axial fluid collection. No mass lesion, mass effect, or midline shift.  No CT evidence of acute infarction.  Cerebral volume is within normal limits.  No ventriculomegaly.  The visualized paranasal sinuses are essentially clear. The mastoid air cells are unopacified.  No evidence of calvarial fracture.  IMPRESSION: Normal head CT.  These results were called by telephone at the time of interpretation on 10/09/2014 at 7:42 pm to Dr. Armida Sans, who verbally acknowledged these results.   Electronically Signed   By: Julian Hy M.D.   On: 10/09/2014 19:42   Mr Brain Wo Contrast  10/09/2014   CLINICAL DATA:  Initial evaluation for acute onset dizziness, blurry vision.  EXAM: MRI HEAD WITHOUT CONTRAST  TECHNIQUE: Multiplanar, multiecho pulse sequences of the brain and surrounding structures were obtained without intravenous contrast.  COMPARISON:  Prior CT from earlier the same day.  FINDINGS: The CSF containing spaces are within normal limits for patient age. Few small subcentimeter T2/FLAIR hyperintense foci are noted within the periventricular and deep white matter both cerebral hemispheres, nonspecific, but most likely related to mild chronic small vessel ischemic disease. No mass lesion, midline shift, or extra-axial fluid collection. Ventricles are normal in size without evidence of hydrocephalus.  No diffusion-weighted signal abnormality is identified to suggest acute intracranial infarct. Gray-white matter differentiation is maintained. Normal flow voids are seen within the intracranial vasculature. No intracranial hemorrhage identified.  The cervicomedullary junction is normal. Scattered degenerative changes  noted within the visualized upper cervical spine. Pituitary gland is within normal limits. Pituitary stalk is midline. The globes and optic nerves demonstrate a normal appearance with normal signal intensity.  The bone marrow signal intensity is normal. Calvarium is intact. Visualized upper cervical spine is within normal limits.  Scalp soft tissues are unremarkable.  Minimal mucoperiosteal thickening present within the maxillary sinuses bilaterally. No air-fluid levels to suggest active sinus infection. No mastoid effusion.  IMPRESSION: 1. No acute intracranial infarct or other abnormality identified. 2. Mild chronic small vessel ischemic changes involving the supratentorial white matter.   Electronically Signed   By: Jeannine Boga M.D.   On: 10/09/2014 21:44   Dg Chest Port 1 View  10/09/2014   CLINICAL DATA:  Generalized weakness.  ,  EXAM: PORTABLE CHEST - 1 VIEW  COMPARISON:  None.  FINDINGS: Normal mediastinum and cardiac silhouette. Normal pulmonary vasculature. No evidence of effusion, infiltrate, or pneumothorax. No acute bony abnormality.  IMPRESSION: No acute cardiopulmonary process.   Electronically Signed   By: Nicole Kindred  Leonia Reeves M.D.   On: 10/09/2014 20:11    Medications:  Prior to Admission:  Prescriptions prior to admission  Medication Sig Dispense Refill Last Dose  . calcium carbonate (OS-CAL) 1250 MG chewable tablet Chew 2 tablets by mouth 2 (two) times daily.   10/09/2014 at Unknown time  . CHELATED IRON PO Take 1 tablet by mouth daily.   10/09/2014 at Unknown time  . diphenhydrAMINE (BENADRYL) 25 MG tablet Take 1 tablet (25 mg total) by mouth every 6 (six) hours. X 3 days then PRN allergic reaction 20 tablet 0 over 30 days  . famotidine (PEPCID) 20 MG tablet Take 1 tablet (20 mg total) by mouth 2 (two) times daily. X 3 days then PRN allergic reaction 20 tablet 0 over 30 days  . Tafluprost 0.0015 % SOLN Apply 1 drop to eye daily.   10/08/2014 at Unknown time  .  triamterene-hydrochlorothiazide (MAXZIDE-25) 37.5-25 MG per tablet Take 1 tablet by mouth daily.   10/09/2014 at Unknown time  . VITAMIN D, ERGOCALCIFEROL, PO Take 1 tablet by mouth 2 (two) times daily.   10/09/2014 at Unknown time  . aspirin 81 MG chewable tablet Chew 1 tablet (81 mg total) by mouth daily. (Patient not taking: Reported on 10/09/2014) 30 tablet 0 Not Taking at Unknown time  . azithromycin (ZITHROMAX Z-PAK) 250 MG tablet Take 2 tabs on day 1 then 1 tab on days 2-5. (Patient not taking: Reported on 10/09/2014) 6 tablet 0 Completed Course at Unknown time  . predniSONE (STERAPRED UNI-PAK) 10 MG tablet Take by mouth daily. Day 1: take 6 tabs.  Day 2: 5 tabs  Day 3: 4 tabs  Day 4: 3 tabs  Day 5: 2 tabs  Day 6: 1 tab (Patient not taking: Reported on 10/09/2014) 21 tablet 0 Completed Course at Unknown time   Scheduled: . aspirin  81 mg Oral Daily  . famotidine  20 mg Oral BID  . heparin  5,000 Units Subcutaneous 3 times per day  . sodium chloride  3 mL Intravenous Q12H   Etta Quill PA-C [facility] [phone]  10/10/2014, 12:34 PM  Patient seen and examined.  Clinical course and management discussed.  Necessary edits performed.  I agree with the above.  Assessment and plan of care developed and discussed below.    Assessment/Plan: 69 YO female s/p a 30 minute episode of light headed sensation and difficulty with balance.  Currently symptoms are not as severe but noted only in certain positions.  MRI of the brain personally reviewed and shows no acute changes.  Symptoms likely vestibular in nature.     Recommendations: 1.  Vestibular rehab 2.  Meclizine prn   Alexis Goodell, MD [facility] [phone]  10/10/2014  1:50 PM

## 2014-10-10 NOTE — Progress Notes (Signed)
Pt arrived to unit alert and oriented x4. Oriented to room, unit, and staff.  Bed in lowest position and call bell is within reach. Will continue to monitor. 

## 2014-10-10 NOTE — Progress Notes (Signed)
UR completed 

## 2014-10-10 NOTE — Evaluation (Signed)
Physical Therapy Evaluation Patient Details Name: KEYANNI WHITTINGHILL MRN: [id number] DOB: 1946-05-14 Today's Date: 10/10/2014   History of Present Illness  Pt admitted with generalized weakness, dizziness, with L sided HA with MRI and CT (-) for any acute abnormality.    Clinical Impression  Pt moving at pre-morbid level and no PT needs identified.  Pt was educated on s/s of a stroke and when to come to the hospital.  Pt with elevated BP and nursing informed.  Will sign off.  If pt's medical status changes and needs PT, please re-order.    Follow Up Recommendations No PT follow up    Equipment Recommendations  None recommended by PT    Recommendations for Other Services       Precautions / Restrictions Precautions Precautions: None Restrictions Weight Bearing Restrictions: No      Mobility  Bed Mobility Overal bed mobility: Independent                Transfers Overall transfer level: Independent                  Ambulation/Gait Ambulation/Gait assistance: Supervision   Assistive device: None Gait Pattern/deviations: WFL(Within Functional Limits) Gait velocity: WNL   General Gait Details: Pt ambulated without AD with no LOB and feels she is close to her pre-morbid level.  Pt denies any dizziness with gait, but did c/o L sided headache. Nursing informed.  Stairs            Wheelchair Mobility    Modified Rankin (Stroke Patients Only) Modified Rankin (Stroke Patients Only) Pre-Morbid Rankin Score: No symptoms Modified Rankin: No symptoms     Balance Overall balance assessment: Needs assistance   Sitting balance-Leahy Scale: Good       Standing balance-Leahy Scale: Good                               Pertinent Vitals/Pain Pain Assessment: Faces Faces Pain Scale: Hurts little more Pain Location: headache Pain Descriptors / Indicators: Aching Pain Intervention(s): Monitored during session    Home Living Family/patient  expects to be discharged to:: Private residence Living Arrangements: Spouse/significant other Available Help at Discharge: Family Type of Home: House Home Access: Stairs to enter Entrance Stairs-Rails: Chemical engineer of Steps: 7 Home Layout: Multi-level Home Equipment: None      Prior Function Level of Independence: Independent               Hand Dominance        Extremity/Trunk Assessment   Upper Extremity Assessment: Overall WFL for tasks assessed           Lower Extremity Assessment: Overall WFL for tasks assessed      Cervical / Trunk Assessment: Normal  Communication   Communication: No difficulties  Cognition Arousal/Alertness: Awake/alert Behavior During Therapy: WFL for tasks assessed/performed Overall Cognitive Status: Within Functional Limits for tasks assessed                      General Comments General comments (skin integrity, edema, etc.): At end of session, once gait completed and pt in recliner.  Pt asking a lot of questions regarding strokes.  Discussed warning signs, when to go to the ER.  Deferred questions regarding aspiring to MD.  When asking about food, asked if she wanted to see a dietician and she declined, but she was familiar with DASH diet.  She also  reports she walks ~45 minutes a day on the treadmill at the [facility].  All of pt's questions answered to her satisfaction.    Exercises        Assessment/Plan    PT Assessment Patent does not need any further PT services  PT Diagnosis Difficulty walking   PT Problem List    PT Treatment Interventions     PT Goals (Current goals can be found in the Care Plan section) Acute Rehab PT Goals PT Goal Formulation: All assessment and education complete, DC therapy    Frequency     Barriers to discharge        Co-evaluation               End of Session Equipment Utilized During Treatment: Gait belt Activity Tolerance: Patient tolerated treatment  well Patient left: in chair;with call bell/phone within reach Nurse Communication: Mobility status;Other (comment) (Elevated BP and headache- okayed ambulation with BP)    Functional Assessment Tool Used: objective findings Functional Limitation: Mobility: Walking and moving around Mobility: Walking and Moving Around Current Status [phone]): 0 percent impaired, limited or restricted Mobility: Walking and Moving Around Goal Status [phone]): 0 percent impaired, limited or restricted Mobility: Walking and Moving Around Discharge Status [phone]): 0 percent impaired, limited or restricted    Time: 1122-1201 PT Time Calculation (min) (ACUTE ONLY): 39 min   Charges:   PT Evaluation $Initial PT Evaluation Tier I: 1 Procedure PT Treatments $Gait Training: 8-22 mins $Therapeutic Activity: 8-22 mins   PT G Codes:   PT G-Codes **NOT FOR INPATIENT CLASS** Functional Assessment Tool Used: objective findings Functional Limitation: Mobility: Walking and moving around Mobility: Walking and Moving Around Current Status ([id number]): 0 percent impaired, limited or restricted Mobility: Walking and Moving Around Goal Status ([id number]): 0 percent impaired, limited or restricted Mobility: Walking and Moving Around Discharge Status [phone]): 0 percent impaired, limited or restricted  Santiago Glad L. Tamala Julian, [place] Pager [phone] 10/10/2014   Llesenia Fogal LUBECK 10/10/2014, 12:40 PM

## 2014-10-11 NOTE — Care Management Note (Signed)
    Page 1 of 1   10/11/2014     5:49:42 PM CARE MANAGEMENT NOTE 10/11/2014  Patient:  Karla Richards, Karla Richards   Account Number:  [id number]  Date Initiated:  10/11/2014  Documentation initiated by:  Tomi Bamberger  Subjective/Objective Assessment:   admit-     Action/Plan:   Anticipated DC Date:  10/10/2014   Anticipated DC Plan:  [place]  CM consult      Choice offered to / List presented to:             Status of service:  Completed, signed off Medicare Important Message given?  NA - LOS <3 / Initial given by admissions (If response is "NO", the following Medicare IM given date fields will be blank) Date Medicare IM given:   Medicare IM given by:   Date Additional Medicare IM given:   Additional Medicare IM given by:    Discharge Disposition:  HOME/SELF CARE  Per UR Regulation:  Reviewed for med. necessity/level of care/duration of stay  If discussed at [place] of Stay Meetings, dates discussed:    Comments:  no needs

## 2014-12-04 DIAGNOSIS — H4011X3 Primary open-angle glaucoma, severe stage: Secondary | ICD-10-CM | POA: Diagnosis not present

## 2015-01-30 DIAGNOSIS — H4011X3 Primary open-angle glaucoma, severe stage: Secondary | ICD-10-CM | POA: Diagnosis not present

## 2015-03-06 DIAGNOSIS — R531 Weakness: Secondary | ICD-10-CM | POA: Diagnosis not present

## 2015-03-06 DIAGNOSIS — H2513 Age-related nuclear cataract, bilateral: Secondary | ICD-10-CM | POA: Diagnosis not present

## 2015-03-06 DIAGNOSIS — H4011X3 Primary open-angle glaucoma, severe stage: Secondary | ICD-10-CM | POA: Diagnosis not present

## 2015-03-06 DIAGNOSIS — H0289 Other specified disorders of eyelid: Secondary | ICD-10-CM | POA: Diagnosis not present

## 2015-03-06 DIAGNOSIS — J122 Parainfluenza virus pneumonia: Secondary | ICD-10-CM | POA: Diagnosis not present

## 2015-03-06 DIAGNOSIS — G4483 Primary cough headache: Secondary | ICD-10-CM | POA: Diagnosis not present

## 2015-03-06 DIAGNOSIS — R0789 Other chest pain: Secondary | ICD-10-CM | POA: Diagnosis not present

## 2015-03-18 DIAGNOSIS — Z01419 Encounter for gynecological examination (general) (routine) without abnormal findings: Secondary | ICD-10-CM | POA: Diagnosis not present

## 2015-03-18 DIAGNOSIS — Z1231 Encounter for screening mammogram for malignant neoplasm of breast: Secondary | ICD-10-CM | POA: Diagnosis not present

## 2015-03-18 DIAGNOSIS — B373 Candidiasis of vulva and vagina: Secondary | ICD-10-CM | POA: Diagnosis not present

## 2015-03-18 DIAGNOSIS — Z124 Encounter for screening for malignant neoplasm of cervix: Secondary | ICD-10-CM | POA: Diagnosis not present

## 2015-03-18 DIAGNOSIS — Z1159 Encounter for screening for other viral diseases: Secondary | ICD-10-CM | POA: Diagnosis not present

## 2015-03-18 DIAGNOSIS — N952 Postmenopausal atrophic vaginitis: Secondary | ICD-10-CM | POA: Diagnosis not present

## 2015-03-18 DIAGNOSIS — Z113 Encounter for screening for infections with a predominantly sexual mode of transmission: Secondary | ICD-10-CM | POA: Diagnosis not present

## 2015-03-18 DIAGNOSIS — M810 Age-related osteoporosis without current pathological fracture: Secondary | ICD-10-CM | POA: Diagnosis not present

## 2015-03-18 DIAGNOSIS — Z114 Encounter for screening for human immunodeficiency virus [HIV]: Secondary | ICD-10-CM | POA: Diagnosis not present

## 2015-03-19 ENCOUNTER — Other Ambulatory Visit: Payer: Self-pay | Admitting: Ophthalmology

## 2015-03-19 MED ORDER — TETRACAINE HCL 0.5 % OP SOLN: 1.0000 [drp] | OPHTHALMIC | Status: AC

## 2015-03-19 MED ORDER — MITOMYCIN-C INJECTION USE IN OR ONLY (0.4 MG/ML): 0.5000 mL | Freq: Once | INTRAVENOUS | Status: AC

## 2015-03-19 NOTE — H&P (Signed)
History & Physical:   DATE:   03-06-2015  NAME:  Karla Richards, Karla Richards      [id number]       HISTORY OF PRESENT ILLNESS: Chief Eye Complaints  GLAUCOMA  patient; FOLLOW UP; NO CHANGES IN VA PER PT; NO OTHER COMPLAINTS PER PT.  patient  on maximum tolerated eye medications :      ACTIVE PROBLEMS: Primary open-angle glaucoma, severe stage   ICD10: H40.11X3  ICD9:  with visual field progression OS despite MTMT  Nuclear senile cataract   ICD10: H25.10  ICD9: 366.16  Onset: 06/05/2014 10:35  Initial Date:    Meibomian gland dysfunction   ICD10: H02.89 ICD9: 375.15 Onset: 06/05/2014 10:36 Initial Date:  SURGERIES: 06/07/14 SLT OD  SLT 360 OS SLT OD ? Pick List - Surgeries  MEDICATIONS: Maxzide (Triamterine/HCTZ, Dyazide):   25 mg-37.5 mg capsule  SIG-  1 cap(s)   once a day   Zioptan: 0.0015% solution SIG-  1 gtt in each affected eye once a day (in the evening) for 30 days  REVIEW OF SYSTEMS:  ROS:   GEN- Constitutional: HENT: GEN - Endocrine: Reports symptoms of LUNGS/Respiratory:  HEART/Cardiovascular: Reports symptoms of heart trouble, hypertension.    ABD/Gastrointestinal:  Musculoskeletal (BJE): NEURO/Neurological: PSYCH/Psychiatric:    Is the pt oriented to time, place, person? yes  Mood  normal     TOBACCO: Never smoker   ICD10: Z87.898 ICD9: V13.89   Pick List - Tobacco - Summary  SOCIAL HISTORY: College professor   FAMILY HISTORY: Family History - 1st Degree Relatives:  Son alive and well.  Daughter alive and well.   Family History - 1st Degree Relatives:  ALLERGIES: Lumigan Severity-  Onset-  Reaction-  REDNESS Status- Active Type- Drug allergy Date Changed-   Brimonidine Tartrate Severity-  Onset-  Reaction-  Status- Active Type- Drug allergy Date Changed-  Beta Blocker Topicals Severity-  Onset-  Reaction-  Status- Active Type- Drug allergy Date Changed-  Patient went to ERRedness and swollen OU Patient   Betablockers   Sulfa  Penicillin allergy   ICD10: Z88.0 ICD9: V14.0  IODINE  Nonsteroidal Anti-Inflammatory Agents Severity-  Onset-  Reaction-  Status- Active Type- Drug allergy Onset-   Starter - Allergies - Summary:  PHYSICAL EXAMINATION: 175/88   pulse 59 VS: BMI: 24.3.  BP: 105/71.  H: 59.00 in.  P: 72 /min.  RR: 14 /min.  W: 120lbs 0oz.    Va     OD   cc 20/30 PH 20/NI OS   cc 20/50  PH NI  EYEGLASSES:  OD   -1.00 + 1.25 x 001  OS   -0.50 + 1.25 x 180 ADD:   +2.25  MR  12/04/2014 08:39  OD  -2.75 +1.50 x 001 20/30 OS  -0.75 +1.25 x 180 20/60 ADD   +2.25  VF:  Full to confrontation testing   OU  Motility FULL  PUPILS: 4MM -MG  EYELIDS & OCULAR ADNEXA mild clogging meibomian glands OU, CRUSTING ON LOWER EYELIDS   SLE: Conjunctiva quiet OU  Cornea + 2 pee OU,   Decreased Tear Break-Up Time 1+ STAINING AND ARCUS     anterior chamber  deep and quiet   OU  Iris gray/brown + transillumination DEFECT  peripupillary OU  Lens +2  nuclear  sclerosis  OU    Ta   in mmHg    OD 18  OS 20 Time 03/06/2015 10:46   Gonio angles open to PTM most arears OU   Dilation  Fundus:  optic nerve  OD    70% pale                              OS  thin rim 90% inferior notch   Macula      OD      clear                                              OS clear   Vessels normal  Periphery   Visual Fields   OD: Paracentral Scotoma Stable  BC:[id number]  Bjerrum w/ inferior accurate stable     OCT  OD THIN INFERIOR NERVE   OS VERY THIN NERVE    Humphery visual field  OD postive Paracentral Scotoma 2 spots worse OS superior bjerrum extending into fixation, stable compared to previous test     Exam: GENERAL: Appearance: HEAD, EARS, NOSE AND THROAT: Ears-Nose (external) Inspection: Externally, nose and ears are normal in appearance and without scars, lesions, or nodules.      Hearing assessment shows no problems with normal conversation.      LUNGS and RESPIRATORY: Lung auscultation elicits no  wheezing, rhonci, rales or rubs and with equal breath sounds.    Respiratory effort described as breathing is unlabored and chest movement is symmetrical.    HEART (Cardiovascular): Heart auscultation discovers regular rate and rhythm; no murmur, gallop or rub. Normal heart sounds . Split S1    ABDOMEN (Gastrointestinal): Mass/Tenderness Exam: Neither are present.     MUSCULOSKELETAL (BJE): Inspection-Palpation: No major bone, joint, tendon, or muscle changes.      NEUROLOGICAL: Alert and oriented. No major deficits of coordination or sensation.      PSYCHIATRIC: Insight and judgment appear  both to be intact and appropriate.    Mood and affect are described as normal mood and full affect.    SKIN: Skin Inspection: No rashes or lesions  ADMITTING DIAGNOSIS: Primary open-angle glaucoma, severe stage   ICD10: H40.11X3  with visual field progression OS despite MTMT  Nuclear senile cataract   ICD10: H25.10  ICD9: 366.16  Onset: 06/05/2014 10:35   Meibomian gland dysfunction   ICD10: H02.89 ICD9: 375.15 Onset: 06/05/2014 10:36 Date Changed:  Status:  SURGICAL TREATMENT PLAN: start cleaning lids with soap & water or used spray (avenova)   trabeculectomy  w [place] OS  Risk and benefits of surgery have been reviewed with the patient and the patient agrees to proceed with the surgical procedure.     Actions:     Handouts: Potassium Saving Diuretics, glaucoma , what is glaucoma?, What is a cataract?, New Handout, glaucoma treatment.    ___________________________ Marylynn Pearson, Brooke Bonito Starter - Inactive Problems:

## 2015-03-24 ENCOUNTER — Encounter (HOSPITAL_COMMUNITY): Payer: Self-pay

## 2015-03-24 ENCOUNTER — Encounter (HOSPITAL_COMMUNITY)
Admission: RE | Admit: 2015-03-24 | Discharge: 2015-03-24 | Disposition: A | Discharge status: Home / Self Care | Payer: Medicare Other | Source: Ambulatory Visit | Attending: Ophthalmology | Admitting: Ophthalmology

## 2015-03-24 DIAGNOSIS — H4011X3 Primary open-angle glaucoma, severe stage: Secondary | ICD-10-CM | POA: Diagnosis not present

## 2015-03-24 DIAGNOSIS — H2512 Age-related nuclear cataract, left eye: Secondary | ICD-10-CM | POA: Diagnosis not present

## 2015-03-24 HISTORY — DX: Unspecified hearing loss, unspecified ear: H91.90

## 2015-03-24 HISTORY — DX: Other complications of anesthesia, initial encounter: T88.59XA

## 2015-03-24 HISTORY — DX: Unspecified osteoarthritis, unspecified site: M19.90

## 2015-03-24 HISTORY — DX: Adverse effect of unspecified anesthetic, initial encounter: T41.45XA

## 2015-03-24 LAB — BASIC METABOLIC PANEL
ANION GAP: 10 (ref 5–15)
BUN: 7 mg/dL (ref 6–20)
CO2: 27 mmol/L (ref 22–32)
Calcium: 10 mg/dL (ref 8.9–10.3)
Chloride: 100 mmol/L — ABNORMAL LOW (ref 101–111)
Creatinine, Ser: 1.11 mg/dL — ABNORMAL HIGH (ref 0.44–1.00)
GFR calc Af Amer: 57 mL/min — ABNORMAL LOW (ref >60)
GFR, EST NON AFRICAN AMERICAN: 49 mL/min — AB (ref >60)
GLUCOSE: 92 mg/dL (ref 65–99)
POTASSIUM: 3.5 mmol/L (ref 3.5–5.1)
Sodium: 137 mmol/L (ref 135–145)

## 2015-03-24 LAB — CBC
HCT: 38.3 % (ref 36.0–46.0)
Hemoglobin: 12.7 g/dL (ref 12.0–15.0)
MCH: 29.8 pg (ref 26.0–34.0)
MCHC: 33.2 g/dL (ref 30.0–36.0)
MCV: 89.9 fL (ref 78.0–100.0)
Platelets: 215 10*3/uL (ref 150–400)
RBC: 4.26 MIL/uL (ref 3.87–5.11)
RDW: 13.6 % (ref 11.5–15.5)
WBC: 5.6 10*3/uL (ref 4.0–10.5)

## 2015-03-24 NOTE — Progress Notes (Signed)
Pt. Reports that the dry hacking cough is related to ?wheat allergy

## 2015-03-24 NOTE — Pre-Procedure Instructions (Signed)
BRITA JURGENSEN  03/24/2015      [facility] [address], [place] - [address] [place] [address] Phone: [phone] Fax: [phone]  [facility] [address], [place] [address] [place] [address] Phone: [phone] Fax: [phone]    Your procedure is scheduled on 03/26/2015  Report to [facility] Admitting at 8:20 A.M.  Call this number if you have problems the morning of surgery:  [phone]   Remember:  Do not eat food or drink liquids after midnight.   Take these medicines the morning of surgery with A SIP OF WATER.................... NONE   Do not wear jewelry, make-up or nail polish.   Do not wear lotions, powders, or perfumes.  You may wear deodorant.   Do not shave 48 hours prior to surgery.     Do not bring valuables to the hospital.   [facility] is not responsible for any belongings or valuables.  Contacts, dentures or bridgework may not be worn into surgery.  Leave your suitcase in the car.  After surgery it may be brought to your room.  For patients admitted to the hospital, discharge time will be determined by your treatment team.  Patients discharged the day of surgery will not be allowed to drive home.   Name and phone number of your driver:   With friend  Special instructions:  Special Instructions: [employer] - Preparing for Surgery  Before surgery, you can play an important role.  Because skin is not sterile, your skin needs to be as free of germs as possible.  You can reduce the number of germs on you skin by washing with CHG (chlorahexidine gluconate) soap before surgery.  CHG is an antiseptic cleaner which kills germs and bonds with the skin to continue killing germs even after washing.  Please DO NOT use if you have an allergy to CHG or antibacterial soaps.  If your skin becomes reddened/irritated stop using the CHG  and inform your nurse when you arrive at Short Stay.  Do not shave (including legs and underarms) for at least 48 hours prior to the first CHG shower.  You may shave your face.  Please follow these instructions carefully:   1.  Shower with CHG Soap the night before surgery and the  morning of Surgery.  2.  If you choose to wash your hair, wash your hair first as usual with your  normal shampoo.  3.  After you shampoo, rinse your hair and body thoroughly to remove the  Shampoo.  4.  Use CHG as you would any other liquid soap.  You can apply chg directly to the skin and wash gently with scrungie or a clean washcloth.  5.  Apply the CHG Soap to your body ONLY FROM THE NECK DOWN.    Do not use on open wounds or open sores.  Avoid contact with your eyes, ears, mouth and genitals (private parts).  Wash genitals (private parts)   with your normal soap.  6.  Wash thoroughly, paying special attention to the area where your surgery will be performed.  7.  Thoroughly rinse your body with warm water from the neck down.  8.  DO NOT shower/wash with your normal soap after using and rinsing off   the CHG Soap.  9.  Pat yourself dry with a clean towel.  10.  Wear clean pajamas.            11.  Place clean sheets on your bed the night of your first shower and do not sleep with pets.  Day of Surgery  Do not apply any lotions/deodorants the morning of surgery.  Please wear clean clothes to the hospital/surgery center.  Please read over the following fact sheets that you were given. Pain Booklet, Coughing and Deep Breathing and Surgical Site Infection Prevention

## 2015-03-25 NOTE — Progress Notes (Signed)
Patient called to arrive at 630. Spoke with patient

## 2015-03-26 ENCOUNTER — Ambulatory Visit (HOSPITAL_COMMUNITY): Payer: Medicare Other | Admitting: Anesthesiology

## 2015-03-26 ENCOUNTER — Ambulatory Visit (HOSPITAL_COMMUNITY)
Admission: RE | Admit: 2015-03-26 | Discharge: 2015-03-26 | Disposition: A | Discharge status: Home / Self Care | Payer: Medicare Other | Source: Ambulatory Visit | Attending: Ophthalmology | Admitting: Ophthalmology

## 2015-03-26 ENCOUNTER — Encounter (HOSPITAL_COMMUNITY)
Admission: RE | Disposition: A | Discharge status: Home / Self Care | Payer: Self-pay | Source: Ambulatory Visit | Attending: Ophthalmology

## 2015-03-26 ENCOUNTER — Encounter (HOSPITAL_COMMUNITY): Payer: Self-pay | Admitting: *Deleted

## 2015-03-26 DIAGNOSIS — H4011X3 Primary open-angle glaucoma, severe stage: Secondary | ICD-10-CM | POA: Diagnosis not present

## 2015-03-26 DIAGNOSIS — H2512 Age-related nuclear cataract, left eye: Secondary | ICD-10-CM | POA: Insufficient documentation

## 2015-03-26 DIAGNOSIS — H4010X3 Unspecified open-angle glaucoma, severe stage: Secondary | ICD-10-CM | POA: Diagnosis not present

## 2015-03-26 DIAGNOSIS — I2510 Atherosclerotic heart disease of native coronary artery without angina pectoris: Secondary | ICD-10-CM | POA: Diagnosis not present

## 2015-03-26 DIAGNOSIS — M1990 Unspecified osteoarthritis, unspecified site: Secondary | ICD-10-CM | POA: Diagnosis not present

## 2015-03-26 HISTORY — PX: TRABECULECTOMY: SHX107

## 2015-03-26 SURGERY — TRABECULECTOMY
Anesthesia: Monitor Anesthesia Care | Site: Eye | Laterality: Left

## 2015-03-26 MED ORDER — SODIUM HYALURONATE 10 MG/ML IO SOLN
INTRAOCULAR | Status: DC | PRN
  Administered 2015-03-26: 0.85 mL via INTRAOCULAR

## 2015-03-26 MED ORDER — MIDAZOLAM HCL 2 MG/2ML IJ SOLN
INTRAMUSCULAR | Status: AC
  Filled 2015-03-26: qty 4

## 2015-03-26 MED ORDER — PREDNISOLONE ACETATE 1 % OP SUSP
OPHTHALMIC | Status: AC
  Filled 2015-03-26: qty 5

## 2015-03-26 MED ORDER — PROPOFOL 10 MG/ML IV BOLUS
INTRAVENOUS | Status: DC | PRN
  Administered 2015-03-26: 80 mg via INTRAVENOUS

## 2015-03-26 MED ORDER — LIDOCAINE HCL (CARDIAC) 20 MG/ML IV SOLN
INTRAVENOUS | Status: AC
  Filled 2015-03-26: qty 5

## 2015-03-26 MED ORDER — TOBRAMYCIN 0.3 % OP OINT
TOPICAL_OINTMENT | OPHTHALMIC | Status: DC | PRN
  Administered 2015-03-26: 1 via OPHTHALMIC

## 2015-03-26 MED ORDER — MITOMYCIN 0.2 MG OP KIT
0.2000 mg | PACK | OPHTHALMIC | Status: AC
  Administered 2015-03-26: .4 mg via OPHTHALMIC
  Filled 2015-03-26: qty 1

## 2015-03-26 MED ORDER — ONDANSETRON HCL 4 MG/2ML IJ SOLN
INTRAMUSCULAR | Status: DC | PRN
  Administered 2015-03-26: 4 mg via INTRAVENOUS

## 2015-03-26 MED ORDER — TETRACAINE HCL 0.5 % OP SOLN
OPHTHALMIC | Status: DC | PRN
  Administered 2015-03-26: 1 [drp] via OPHTHALMIC

## 2015-03-26 MED ORDER — TRIAMCINOLONE ACETONIDE 40 MG/ML IJ SUSP
INTRAMUSCULAR | Status: DC | PRN
  Administered 2015-03-26: .1 mL

## 2015-03-26 MED ORDER — BSS IO SOLN
INTRAOCULAR | Status: AC
  Filled 2015-03-26: qty 500

## 2015-03-26 MED ORDER — GATIFLOXACIN 0.5 % OP SOLN
1.0000 [drp] | OPHTHALMIC | Status: AC | PRN
  Administered 2015-03-26 (×3): 1 [drp] via OPHTHALMIC

## 2015-03-26 MED ORDER — LIDOCAINE HCL (CARDIAC) 10 MG/ML IV SOLN
INTRAVENOUS | Status: DC | PRN
  Administered 2015-03-26: 40 mg via INTRAVENOUS

## 2015-03-26 MED ORDER — PILOCARPINE HCL 4 % OP SOLN
OPHTHALMIC | Status: AC
  Filled 2015-03-26: qty 15

## 2015-03-26 MED ORDER — EPINEPHRINE HCL 1 MG/ML IJ SOLN
INTRAMUSCULAR | Status: AC
  Filled 2015-03-26: qty 1

## 2015-03-26 MED ORDER — BUPIVACAINE HCL (PF) 0.75 % IJ SOLN
INTRAMUSCULAR | Status: AC
  Filled 2015-03-26: qty 10

## 2015-03-26 MED ORDER — BACITRACIN-POLYMYXIN B 500-10000 UNIT/GM OP OINT
TOPICAL_OINTMENT | OPHTHALMIC | Status: AC
  Filled 2015-03-26: qty 3.5

## 2015-03-26 MED ORDER — 0.9 % SODIUM CHLORIDE (POUR BTL) OPTIME
TOPICAL | Status: DC | PRN
  Administered 2015-03-26: 100 mL

## 2015-03-26 MED ORDER — TRIAMCINOLONE ACETONIDE 40 MG/ML IJ SUSP
INTRAMUSCULAR | Status: AC
  Filled 2015-03-26: qty 5

## 2015-03-26 MED ORDER — LIDOCAINE-EPINEPHRINE 2 %-1:100000 IJ SOLN
INTRAMUSCULAR | Status: AC
  Filled 2015-03-26: qty 1

## 2015-03-26 MED ORDER — LIDOCAINE HCL 2 % IJ SOLN
INTRAMUSCULAR | Status: AC
  Filled 2015-03-26: qty 20

## 2015-03-26 MED ORDER — HYPROMELLOSE (GONIOSCOPIC) 2.5 % OP SOLN
OPHTHALMIC | Status: AC
  Filled 2015-03-26: qty 15

## 2015-03-26 MED ORDER — FENTANYL CITRATE (PF) 100 MCG/2ML IJ SOLN
INTRAMUSCULAR | Status: DC | PRN
  Administered 2015-03-26 (×2): 25 ug via INTRAVENOUS

## 2015-03-26 MED ORDER — BSS IO SOLN
INTRAOCULAR | Status: DC | PRN
  Administered 2015-03-26: 15 mL via INTRAOCULAR

## 2015-03-26 MED ORDER — SODIUM HYALURONATE 10 MG/ML IO SOLN
INTRAOCULAR | Status: AC
  Filled 2015-03-26: qty 0.85

## 2015-03-26 MED ORDER — ATROPINE SULFATE 1 % OP SOLN
OPHTHALMIC | Status: DC | PRN
  Administered 2015-03-26: 1 [drp] via OPHTHALMIC

## 2015-03-26 MED ORDER — FENTANYL CITRATE (PF) 100 MCG/2ML IJ SOLN: 25.0000 ug | INTRAMUSCULAR | Status: DC | PRN

## 2015-03-26 MED ORDER — GATIFLOXACIN 0.5 % OP SOLN
OPHTHALMIC | Status: AC
  Filled 2015-03-26: qty 2.5

## 2015-03-26 MED ORDER — NA CHONDROIT SULF-NA HYALURON 40-30 MG/ML IO SOLN
INTRAOCULAR | Status: AC
  Filled 2015-03-26: qty 0.5

## 2015-03-26 MED ORDER — PROPOFOL 10 MG/ML IV BOLUS
INTRAVENOUS | Status: AC
  Filled 2015-03-26: qty 20

## 2015-03-26 MED ORDER — FLUORESCEIN SODIUM 1 MG OP STRP
ORAL_STRIP | OPHTHALMIC | Status: DC | PRN
  Administered 2015-03-26: 1 via OPHTHALMIC

## 2015-03-26 MED ORDER — TETRACAINE HCL 0.5 % OP SOLN
OPHTHALMIC | Status: AC
  Filled 2015-03-26: qty 2

## 2015-03-26 MED ORDER — GENTAMICIN SULFATE 40 MG/ML IJ SOLN
INTRAMUSCULAR | Status: AC
  Filled 2015-03-26: qty 2

## 2015-03-26 MED ORDER — LACTATED RINGERS IV SOLN
INTRAVENOUS | Status: DC | PRN
  Administered 2015-03-26: 08:00:00 via INTRAVENOUS

## 2015-03-26 MED ORDER — PREDNISOLONE ACETATE 1 % OP SUSP
1.0000 [drp] | OPHTHALMIC | Status: AC
  Administered 2015-03-26: 1 [drp] via OPHTHALMIC

## 2015-03-26 MED ORDER — FENTANYL CITRATE (PF) 250 MCG/5ML IJ SOLN
INTRAMUSCULAR | Status: AC
  Filled 2015-03-26: qty 5

## 2015-03-26 MED ORDER — BSS IO SOLN
INTRAOCULAR | Status: DC | PRN
  Administered 2015-03-26: 500 mL via INTRAOCULAR

## 2015-03-26 MED ORDER — ONDANSETRON HCL 4 MG/2ML IJ SOLN
INTRAMUSCULAR | Status: AC
  Filled 2015-03-26: qty 2

## 2015-03-26 MED ORDER — ACETYLCHOLINE CHLORIDE 20 MG IO SOLR
INTRAOCULAR | Status: AC
  Filled 2015-03-26: qty 1

## 2015-03-26 MED ORDER — ACETAMINOPHEN 325 MG PO TABS
650.0000 mg | ORAL_TABLET | ORAL | Status: DC | PRN
  Filled 2015-03-26: qty 2

## 2015-03-26 MED ORDER — MIDAZOLAM HCL 5 MG/5ML IJ SOLN
INTRAMUSCULAR | Status: DC | PRN
  Administered 2015-03-26: 2 mg via INTRAVENOUS

## 2015-03-26 MED ORDER — DEXAMETHASONE SODIUM PHOSPHATE 10 MG/ML IJ SOLN
INTRAMUSCULAR | Status: AC
  Filled 2015-03-26: qty 1

## 2015-03-26 MED ORDER — HYALURONIDASE HUMAN 150 UNIT/ML IJ SOLN
INTRAMUSCULAR | Status: AC
  Filled 2015-03-26: qty 1

## 2015-03-26 MED ORDER — BSS IO SOLN
INTRAOCULAR | Status: AC
  Filled 2015-03-26: qty 15

## 2015-03-26 MED ORDER — TOBRAMYCIN-DEXAMETHASONE 0.3-0.1 % OP OINT
TOPICAL_OINTMENT | OPHTHALMIC | Status: AC
  Filled 2015-03-26: qty 3.5

## 2015-03-26 MED ORDER — LIDOCAINE-EPINEPHRINE 2 %-1:100000 IJ SOLN
INTRAMUSCULAR | Status: DC | PRN
  Administered 2015-03-26: 5 mL via RETROBULBAR

## 2015-03-26 SURGICAL SUPPLY — 46 items
APL SRG 3 HI ABS STRL LF PLS (MISCELLANEOUS) ×1
APPLICATOR COTTON TIP 6IN STRL (MISCELLANEOUS) ×3 IMPLANT
APPLICATOR DR MATTHEWS STRL (MISCELLANEOUS) ×3 IMPLANT
BLADE EYE CATARACT 19 1.4 BEAV (BLADE) ×3 IMPLANT
BLADE MINI RND TIP GREEN BEAV (BLADE) IMPLANT
BLADE STAB KNIFE 45DEG (BLADE) ×3 IMPLANT
CANISTER SUCTION 2500CC (MISCELLANEOUS) IMPLANT
CANNULA ANT/CHMB 27G (MISCELLANEOUS) ×1 IMPLANT
CANNULA ANT/CHMB 27GA (MISCELLANEOUS) ×3 IMPLANT
CORDS BIPOLAR (ELECTRODE) ×3 IMPLANT
COVER MAYO STAND STRL (DRAPES) IMPLANT
DRAPE OPHTHALMIC 40X48 W POUCH (DRAPES) ×3 IMPLANT
DRAPE RETRACTOR (MISCELLANEOUS) ×3 IMPLANT
ERASER HMR WETFIELD 23G BP (MISCELLANEOUS) ×2 IMPLANT
GLOVE BIOGEL PI IND STRL 7.0 (GLOVE) IMPLANT
GLOVE BIOGEL PI INDICATOR 7.0 (GLOVE) ×2
GLOVE SURG SS PI 7.0 STRL IVOR (GLOVE) ×2 IMPLANT
GLOVE SURG SS PI 7.5 STRL IVOR (GLOVE) ×2 IMPLANT
GLOVE SURG SS PI 8.0 STRL IVOR (GLOVE) ×4 IMPLANT
GOWN STRL REIN XL XLG (GOWN DISPOSABLE) ×9 IMPLANT
KIT BASIN OR (CUSTOM PROCEDURE TRAY) ×3 IMPLANT
KIT ROOM TURNOVER OR (KITS) ×3 IMPLANT
KNIFE GRIESHABER SHARP 2.5MM (MISCELLANEOUS) ×3 IMPLANT
NDL 25GX 5/8IN NON SAFETY (NEEDLE) ×1 IMPLANT
NDL FILTER BLUNT 18X1 1/2 (NEEDLE) ×1 IMPLANT
NDL HYPO 30X.5 LL (NEEDLE) ×1 IMPLANT
NEEDLE 25GX 5/8IN NON SAFETY (NEEDLE) ×3 IMPLANT
NEEDLE FILTER BLUNT 18X 1/2SAF (NEEDLE)
NEEDLE FILTER BLUNT 18X1 1/2 (NEEDLE) IMPLANT
NEEDLE HYPO 30X.5 LL (NEEDLE) ×3 IMPLANT
NS IRRIG 1000ML POUR BTL (IV SOLUTION) ×3 IMPLANT
PACK CATARACT CUSTOM (CUSTOM PROCEDURE TRAY) ×3 IMPLANT
PAD ARMBOARD 7.5X6 YLW CONV (MISCELLANEOUS) ×3 IMPLANT
PAK PIK CVS CATARACT (OPHTHALMIC) ×3 IMPLANT
SPEAR EYE SURG WECK-CEL (MISCELLANEOUS) IMPLANT
SUT ETHILON 10 0 CS140 6 (SUTURE) ×3 IMPLANT
SUT ETHILON 9 0 BV100 4 (SUTURE) ×3 IMPLANT
SUT SILK 6 0 G 6 (SUTURE) ×3 IMPLANT
SUT VICRYL 9-0 (SUTURE) IMPLANT
SYR 50ML SLIP (SYRINGE) ×3 IMPLANT
SYR TB 1ML LUER SLIP (SYRINGE) IMPLANT
TIP ABS 45DEG FLARED 0.9MM (TIP) ×3 IMPLANT
TOWEL OR 17X26 10 PK STRL BLUE (TOWEL DISPOSABLE) ×3 IMPLANT
TUBE CONNECTING 12'X1/4 (SUCTIONS) ×1
TUBE CONNECTING 12X1/4 (SUCTIONS) ×1 IMPLANT
WIPE INSTRUMENT VISIWIPE 73X73 (MISCELLANEOUS) ×3 IMPLANT

## 2015-03-26 NOTE — H&P (View-Only) (Signed)
History & Physical:   DATE:   03-06-2015  NAME:  Karla Richards, Karla Richards      [id number]       HISTORY OF PRESENT ILLNESS: Chief Eye Complaints  GLAUCOMA  patient; FOLLOW UP; NO CHANGES IN VA PER PT; NO OTHER COMPLAINTS PER PT.  patient  on maximum tolerated eye medications :      ACTIVE PROBLEMS: Primary open-angle glaucoma, severe stage   ICD10: H40.11X3  ICD9:  with visual field progression OS despite MTMT  Nuclear senile cataract   ICD10: H25.10  ICD9: 366.16  Onset: 06/05/2014 10:35  Initial Date:    Meibomian gland dysfunction   ICD10: H02.89 ICD9: 375.15 Onset: 06/05/2014 10:36 Initial Date:  SURGERIES: 06/07/14 SLT OD  SLT 360 OS SLT OD ? Pick List - Surgeries  MEDICATIONS: Maxzide (Triamterine/HCTZ, Dyazide):   25 mg-37.5 mg capsule  SIG-  1 cap(s)   once a day   Zioptan: 0.0015% solution SIG-  1 gtt in each affected eye once a day (in the evening) for 30 days  REVIEW OF SYSTEMS:  ROS:   GEN- Constitutional: HENT: GEN - Endocrine: Reports symptoms of LUNGS/Respiratory:  HEART/Cardiovascular: Reports symptoms of heart trouble, hypertension.    ABD/Gastrointestinal:  Musculoskeletal (BJE): NEURO/Neurological: PSYCH/Psychiatric:    Is the pt oriented to time, place, person? yes  Mood  normal     TOBACCO: Never smoker   ICD10: Z87.898 ICD9: V13.89   Pick List - Tobacco - Summary  SOCIAL HISTORY: College professor   FAMILY HISTORY: Family History - 1st Degree Relatives:  Son alive and well.  Daughter alive and well.   Family History - 1st Degree Relatives:  ALLERGIES: Lumigan Severity-  Onset-  Reaction-  REDNESS Status- Active Type- Drug allergy Date Changed-   Brimonidine Tartrate Severity-  Onset-  Reaction-  Status- Active Type- Drug allergy Date Changed-  Beta Blocker Topicals Severity-  Onset-  Reaction-  Status- Active Type- Drug allergy Date Changed-  Patient went to ERRedness and swollen OU Patient   Betablockers   Sulfa  Penicillin allergy   ICD10: Z88.0 ICD9: V14.0  IODINE  Nonsteroidal Anti-Inflammatory Agents Severity-  Onset-  Reaction-  Status- Active Type- Drug allergy Onset-   Starter - Allergies - Summary:  PHYSICAL EXAMINATION: 175/88   pulse 59 VS: BMI: 24.3.  BP: 105/71.  H: 59.00 in.  P: 72 /min.  RR: 14 /min.  W: 120lbs 0oz.    Va     OD   cc 20/30 PH 20/NI OS   cc 20/50  PH NI  EYEGLASSES:  OD   -1.00 + 1.25 x 001  OS   -0.50 + 1.25 x 180 ADD:   +2.25  MR  12/04/2014 08:39  OD  -2.75 +1.50 x 001 20/30 OS  -0.75 +1.25 x 180 20/60 ADD   +2.25  VF:  Full to confrontation testing   OU  Motility FULL  PUPILS: 4MM -MG  EYELIDS & OCULAR ADNEXA mild clogging meibomian glands OU, CRUSTING ON LOWER EYELIDS   SLE: Conjunctiva quiet OU  Cornea + 2 pee OU,   Decreased Tear Break-Up Time 1+ STAINING AND ARCUS     anterior chamber  deep and quiet   OU  Iris gray/brown + transillumination DEFECT  peripupillary OU  Lens +2  nuclear  sclerosis  OU    Ta   in mmHg    OD 18  OS 20 Time 03/06/2015 10:46   Gonio angles open to PTM most arears OU   Dilation  Fundus:  optic nerve  OD    70% pale                              OS  thin rim 90% inferior notch   Macula      OD      clear                                              OS clear   Vessels normal  Periphery   Visual Fields   OD: Paracentral Scotoma Stable  OJ:[id number]  Bjerrum w/ inferior accurate stable     OCT  OD THIN INFERIOR NERVE   OS VERY THIN NERVE    Humphery visual field  OD postive Paracentral Scotoma 2 spots worse OS superior bjerrum extending into fixation, stable compared to previous test     Exam: GENERAL: Appearance: HEAD, EARS, NOSE AND THROAT: Ears-Nose (external) Inspection: Externally, nose and ears are normal in appearance and without scars, lesions, or nodules.      Hearing assessment shows no problems with normal conversation.      LUNGS and RESPIRATORY: Lung auscultation elicits no  wheezing, rhonci, rales or rubs and with equal breath sounds.    Respiratory effort described as breathing is unlabored and chest movement is symmetrical.    HEART (Cardiovascular): Heart auscultation discovers regular rate and rhythm; no murmur, gallop or rub. Normal heart sounds . Split S1    ABDOMEN (Gastrointestinal): Mass/Tenderness Exam: Neither are present.     MUSCULOSKELETAL (BJE): Inspection-Palpation: No major bone, joint, tendon, or muscle changes.      NEUROLOGICAL: Alert and oriented. No major deficits of coordination or sensation.      PSYCHIATRIC: Insight and judgment appear  both to be intact and appropriate.    Mood and affect are described as normal mood and full affect.    SKIN: Skin Inspection: No rashes or lesions  ADMITTING DIAGNOSIS: Primary open-angle glaucoma, severe stage   ICD10: H40.11X3  with visual field progression OS despite MTMT  Nuclear senile cataract   ICD10: H25.10  ICD9: 366.16  Onset: 06/05/2014 10:35   Meibomian gland dysfunction   ICD10: H02.89 ICD9: 375.15 Onset: 06/05/2014 10:36 Date Changed:  Status:  SURGICAL TREATMENT PLAN: start cleaning lids with soap & water or used spray (avenova)   trabeculectomy  w [place] OS  Risk and benefits of surgery have been reviewed with the patient and the patient agrees to proceed with the surgical procedure.     Actions:     Handouts: Potassium Saving Diuretics, glaucoma , what is glaucoma?, What is a cataract?, New Handout, glaucoma treatment.    ___________________________ Marylynn Pearson, Brooke Bonito Starter - Inactive Problems:

## 2015-03-26 NOTE — Anesthesia Procedure Notes (Signed)
Procedure Name: MAC Date/Time: 03/26/2015 8:43 AM Performed by: Michele Rockers Pre-anesthesia Checklist: Patient identified, Emergency Drugs available, Suction available, Timeout performed and Patient being monitored Patient Re-evaluated:Patient Re-evaluated prior to inductionOxygen Delivery Method: Nasal Cannula

## 2015-03-26 NOTE — Discharge Instructions (Signed)
The patient should not remove the eye patch. The patient should sleep on her back or right side. Avoid heavy lifting bending or straining. Use no eyedrops left eye continue the same eyedrops right eye as before the surgery. If pain is not relieved with normal pain medication the patient should call the doctor's office.

## 2015-03-26 NOTE — Anesthesia Preprocedure Evaluation (Addendum)
Anesthesia Evaluation  Patient identified by MRN, date of birth, ID band Patient awake    Reviewed: Allergy & Precautions, H&P , NPO status , Patient's Chart, lab work & pertinent test results  Airway Mallampati: II  TM Distance: >3 FB Neck ROM: Full    Dental no notable dental hx. (+) Teeth Intact, Dental Advisory Given   Pulmonary neg pulmonary ROS,  breath sounds clear to auscultation  Pulmonary exam normal       Cardiovascular hypertension, Pt. on medications + CAD negative cardio ROS  Rhythm:Regular Rate:Normal     Neuro/Psych TIAnegative psych ROS   GI/Hepatic negative GI ROS, Neg liver ROS,   Endo/Other  negative endocrine ROS  Renal/GU negative Renal ROS  negative genitourinary   Musculoskeletal  (+) Arthritis -, Osteoarthritis,    Abdominal   Peds  Hematology negative hematology ROS (+)   Anesthesia Other Findings   Reproductive/Obstetrics negative OB ROS                            Anesthesia Physical Anesthesia Plan  ASA: III  Anesthesia Plan: MAC   Post-op Pain Management:    Induction: Intravenous  Airway Management Planned: Simple Face Mask  Additional Equipment:   Intra-op Plan:   Post-operative Plan:   Informed Consent: I have reviewed the patients History and Physical, chart, labs and discussed the procedure including the risks, benefits and alternatives for the proposed anesthesia with the patient or authorized representative who has indicated his/her understanding and acceptance.   Dental advisory given  Plan Discussed with: CRNA  Anesthesia Plan Comments:         Anesthesia Quick Evaluation

## 2015-03-26 NOTE — Op Note (Signed)
Preoperative diagnosis: Uncontrolled severe stage primary open-angle glaucoma left eye Postop diagnosis: Same Procedure: Trabeculectomy with peripheral iridectomy with mitomycin-C left eye Anesthesia: 2% Xylocaine with epinephrine in a 5050 mixture 0.75% Marcaine with ample Wydase Combinations: None Assistant: Unknown Foley Procedure: The patient was transported to the operating room where she was given a peribulbar block with the aforementioned local and aesthetic agent. Following this the patient's face is prepped and draped in the usual sterile fashion. With the operating microscope in position and the surgeon sitting at the 12:00 position a muscle hook was used to infraducted the eye and a 6-0 nylon suture was passed through clear cornea to fixate the globe inferiorly the superior nasal quadrant was chosen using a Wescott scissor an incision was made in the superior nasal fornix extending it toward 12:00. Then using the blunt Wescott scissors careful dissection was made toward the limbus forming a limbus-based conjunctival flap. The Tooke blade was used to recess T9 fibers and bleeding was controlled with cautery. A 45 blade was then used to fashion a half thickness scleral flap with the base of 4 mm at the limbus the mom and a Colibri forceps were then used to elevate the tip of the flap the grease Hopper blade was then used to dissect the half thickness scleral flap to the limbus until the limbal tissues were identified following this mitomycin-C 0.4 mg/mL were placed on Gelfoam sponges and 3 sponges were placed beneath the conjunctiva and beneath the scleral flap and allowed to stay on the eye for 2 minutes the sponges were then removed and the eye was irrigated with 30 mL of balanced salt solution following this a paracentesis tract was created through clear cornea using a 5100 blade at the 4:00 position BSS was injected to maintain the anterior chamber following this a scleral flap was elevated a 45  blade was used to enter under the scleral flap into the anterior chamber with egressive aqueous noted. A Descemet's punch was then used to remove a 1 x 3 block of tissue. There was iris prolapse a basilar peripheral iridectomy was performed using the Colibri forceps and the Vannas scissors. BSS was again injected. You interrupted 10-0 nylon sutures placed on a scleral flap BSS injected in the anterior chamber the chamber was well maintained therefore a 9-0 nylon suture on a BV 100 needle was then used to form a careful closure incorporating T9 and and conjunctival tissue BSS was injected in the anterior chamber a low bleb elevated the incision line was Seidel negative therefore subconjunctival injection of Kenalog 4 mg was given in the inferior nasal conjunctival space. All instruments were then removed from the eye topical atropine and topical Tobradex were applied to the eye a patch and Fox shield were placed and the patient returned to recovery area stable condition Marylynn Pearson Junior M.D.

## 2015-03-26 NOTE — Anesthesia Postprocedure Evaluation (Signed)
  Anesthesia Post-op Note  Patient: Karla Richards  Procedure(s) Performed: Procedure(s): TRABECULECTOMY WITH [facility] ON THE LEFT EYE (Left)  Patient Location: PACU  Anesthesia Type: MAC  Level of Consciousness: awake and alert   Airway and Oxygen Therapy: Patient Spontanous Breathing  Post-op Pain: Controlled  Post-op Assessment: Post-op Vital signs reviewed, Patient's Cardiovascular Status Stable and Respiratory Function Stable  Post-op Vital Signs: Reviewed  Filed Vitals:   03/26/15 1030  BP:   Pulse: 54  Temp:   Resp: 14    Complications: No apparent anesthesia complications

## 2015-03-26 NOTE — Transfer of Care (Signed)
Immediate Anesthesia Transfer of Care Note  Patient: Karla Richards  Procedure(s) Performed: Procedure(s): TRABECULECTOMY WITH [facility] ON THE LEFT EYE (Left)  Patient Location: PACU  Anesthesia Type:MAC  Level of Consciousness: awake, alert , oriented, patient cooperative and responds to stimulation  Airway & Oxygen Therapy: Patient Spontanous Breathing  Post-op Assessment: Report given to RN, Post -op Vital signs reviewed and stable and Patient moving all extremities  Post vital signs: Reviewed and stable  Last Vitals:  Filed Vitals:   03/26/15 0738  BP: 175/82  Pulse:   Temp:   Resp:     Complications: No apparent anesthesia complications

## 2015-03-26 NOTE — Interval H&P Note (Signed)
History and Physical Interval Note:  03/26/2015 8:16 AM  Karla Richards  has presented today for surgery, with the diagnosis of primary open angle glacoma severe stage  The various methods of treatment have been discussed with the patient and family. After consideration of risks, benefits and other options for treatment, the patient has consented to  Procedure(s): TRABECULECTOMY WITH [facility] ON THE LEFT EYE (Left) as a surgical intervention .  The patient's history has been reviewed, patient examined, no change in status, stable for surgery.  I have reviewed the patient's chart and labs.  Questions were answered to the patient's satisfaction.     Karla Richards

## 2015-03-27 ENCOUNTER — Encounter (HOSPITAL_COMMUNITY): Payer: Self-pay | Admitting: Ophthalmology

## 2015-05-09 ENCOUNTER — Other Ambulatory Visit: Payer: Self-pay | Admitting: Obstetrics and Gynecology

## 2015-05-09 DIAGNOSIS — M810 Age-related osteoporosis without current pathological fracture: Secondary | ICD-10-CM

## 2015-05-25 DIAGNOSIS — I10 Essential (primary) hypertension: Secondary | ICD-10-CM | POA: Diagnosis not present

## 2015-05-25 DIAGNOSIS — J069 Acute upper respiratory infection, unspecified: Secondary | ICD-10-CM | POA: Diagnosis not present

## 2015-07-18 DIAGNOSIS — H401133 Primary open-angle glaucoma, bilateral, severe stage: Secondary | ICD-10-CM | POA: Diagnosis not present

## 2015-10-17 DIAGNOSIS — H401133 Primary open-angle glaucoma, bilateral, severe stage: Secondary | ICD-10-CM | POA: Diagnosis not present

## 2015-10-17 DIAGNOSIS — H2513 Age-related nuclear cataract, bilateral: Secondary | ICD-10-CM | POA: Diagnosis not present

## 2015-10-17 DIAGNOSIS — B399 Histoplasmosis, unspecified: Secondary | ICD-10-CM | POA: Diagnosis not present

## 2015-11-10 ENCOUNTER — Emergency Department (HOSPITAL_COMMUNITY)
Admission: EM | Admit: 2015-11-10 | Discharge: 2015-11-10 | Disposition: A | Discharge status: Home / Self Care | Payer: Medicare Other | Attending: Emergency Medicine | Admitting: Emergency Medicine

## 2015-11-10 ENCOUNTER — Encounter (HOSPITAL_COMMUNITY): Payer: Self-pay | Admitting: *Deleted

## 2015-11-10 DIAGNOSIS — S40821A Blister (nonthermal) of right upper arm, initial encounter: Secondary | ICD-10-CM

## 2015-11-10 DIAGNOSIS — T310 Burns involving less than 10% of body surface: Secondary | ICD-10-CM | POA: Diagnosis not present

## 2015-11-10 DIAGNOSIS — Z79899 Other long term (current) drug therapy: Secondary | ICD-10-CM | POA: Insufficient documentation

## 2015-11-10 DIAGNOSIS — Z880 Allergy status to penicillin: Secondary | ICD-10-CM | POA: Diagnosis not present

## 2015-11-10 DIAGNOSIS — I10 Essential (primary) hypertension: Secondary | ICD-10-CM | POA: Insufficient documentation

## 2015-11-10 DIAGNOSIS — I2510 Atherosclerotic heart disease of native coronary artery without angina pectoris: Secondary | ICD-10-CM | POA: Diagnosis not present

## 2015-11-10 DIAGNOSIS — H409 Unspecified glaucoma: Secondary | ICD-10-CM | POA: Diagnosis not present

## 2015-11-10 DIAGNOSIS — R21 Rash and other nonspecific skin eruption: Secondary | ICD-10-CM | POA: Diagnosis present

## 2015-11-10 DIAGNOSIS — Z8619 Personal history of other infectious and parasitic diseases: Secondary | ICD-10-CM | POA: Diagnosis not present

## 2015-11-10 DIAGNOSIS — L988 Other specified disorders of the skin and subcutaneous tissue: Secondary | ICD-10-CM | POA: Diagnosis not present

## 2015-11-10 DIAGNOSIS — Z91040 Latex allergy status: Secondary | ICD-10-CM | POA: Insufficient documentation

## 2015-11-10 MED ORDER — HYDROCORTISONE 1 % EX CREA: TOPICAL_CREAM | CUTANEOUS | Status: DC

## 2015-11-10 NOTE — Discharge Instructions (Signed)
Blisters Follow-up with your primary care physician. Return for surrounding redness, warmth, or tenderness. Apply hydrocortisone cream to the back of the hand twice a day. A blister is a fluid-filled sac that forms between layers of skin. Blisters often form in areas where skin rubs against other skin or rubs against something else. The most common areas for blisters are the hands and feet. CAUSES A blister can be caused by:  An injury.  A burn.  An allergic reaction.  An infection.  Exposure to irritating chemicals.  Friction. Friction blisters often result from:  Sports.  Repetitive activities.  Shoes that are too tight or too loose. SIGNS AND SYMPTOMS A blister is often round and looks like a bump. It may itch or be painful to the touch. The liquid in a blister is clear or bloody. Before a blister forms, the skin may become red, feel warm, itch, or be painful to the touch. DIAGNOSIS A blister can usually be diagnosed from its appearance. TREATMENT Treatment involves protecting the area where the blister has formed until the skin has healed. If something is likely to rub against the blister, apply a bandage (dressing) with a hole in the middle over the blister. Most blisters break open, dry up, and go away on their own within 10 days. Rarely, blisters that are very painful may be drained before they break open on their own. Draining of a blister should only be done by a health care provider under sterile conditions. HOME CARE INSTRUCTIONS  Protect the area where the blister has formed as directed by your health care provider.  Do not open or pop your blister, because it could become infected.  If the blister is very painful, ask your health care provider whether you should have it drained.  If the blister breaks open on its own:  Do not remove the loose skin that is over the blister.  Wash the blister area with soap and water every day.  After washing the blister area,  you may apply an antibiotic cream or ointment and cover the area with a bandage. PREVENTION Taking these steps can help to prevent blisters that are caused by friction:  Wear comfortable shoes that fit well.  Always wear socks with shoes.  Wear extra socks or use tape, bandages, or pads over blister-prone areas as needed.  Wear protective gear, such as gloves, when participating in sports or activities that can cause blisters.  Use powders as needed to keep your feet dry. SEEK MEDICAL CARE IF:  You have increased redness, swelling, or pain in the blister area.  A puslike discharge is coming from the blister area.  You have a fever.  You have chills.   This information is not intended to replace advice given to you by your health care provider. Make sure you discuss any questions you have with your health care provider.   Document Released: 09/09/2004 Document Revised: 08/23/2014 Document Reviewed: 03/02/2014 Elsevier Interactive Patient Education [employer].

## 2015-11-10 NOTE — ED Notes (Signed)
PA at bedside.

## 2015-11-10 NOTE — ED Notes (Signed)
Pt reports possible insect bite to right upper arm, first noticed it this am. Small blister area noted. No redness, swelling or warmth noted to arm.
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
## 2015-11-15 ENCOUNTER — Encounter (HOSPITAL_COMMUNITY): Payer: Self-pay | Admitting: Emergency Medicine

## 2015-11-15 ENCOUNTER — Emergency Department (INDEPENDENT_AMBULATORY_CARE_PROVIDER_SITE_OTHER)
Admission: EM | Admit: 2015-11-15 | Discharge: 2015-11-15 | Disposition: A | Discharge status: Home / Self Care | Payer: Medicare Other | Source: Home / Self Care | Attending: Emergency Medicine | Admitting: Emergency Medicine

## 2015-11-15 DIAGNOSIS — Z23 Encounter for immunization: Secondary | ICD-10-CM | POA: Diagnosis not present

## 2015-11-15 DIAGNOSIS — S61239A Puncture wound without foreign body of unspecified finger without damage to nail, initial encounter: Secondary | ICD-10-CM

## 2015-11-15 MED ORDER — TETANUS-DIPHTH-ACELL PERTUSSIS 5-2.5-18.5 LF-MCG/0.5 IM SUSP
INTRAMUSCULAR | Status: AC
  Filled 2015-11-15: qty 0.5

## 2015-11-15 MED ORDER — TETANUS-DIPHTH-ACELL PERTUSSIS 5-2.5-18.5 LF-MCG/0.5 IM SUSP
0.5000 mL | Freq: Once | INTRAMUSCULAR | Status: AC
  Administered 2015-11-15: 0.5 mL via INTRAMUSCULAR
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
## 2015-11-15 NOTE — Discharge Instructions (Signed)
Puncture Wound A puncture wound is an injury that is caused by a sharp, thin object that goes through your skin, such as a nail. A puncture wound usually does not leave a large opening in your skin, so it may not bleed a lot. However, when you get a puncture wound, dirt or other materials (foreign bodies) can be forced into your wound and break off inside. This makes it more likely that an infection will happen, such as tetanus. HOME CARE Medicines  Take or apply over-the-counter and prescription medicines only as told by your doctor.  If you were prescribed an antibiotic medicine, take or apply it as told by your doctor. Do not stop using the antibiotic even if your condition starts to get better. Wound Care  There are many ways to close and cover a wound. For example, a wound can be covered with stitches (sutures), skin glue, or adhesive strips. Follow instructions from your doctor about:  How to take care of your wound.  When and how you should change your bandage (dressing).  When you should remove your bandage.  Removing whatever was used to close your wound.  Keep the bandage dry as told by your doctor. Do not take baths, swim, use a hot tub, or do anything that would put your wound underwater until your doctor says it is okay.  Clean the wound as told by your doctor.  Do not scratch or pick at the wound.  Check your wound every day for signs of infection. Watch for:  Redness, swelling, or pain.  Fluid, blood, or pus. General Instructions  Raise (elevate) the injured area above the level of your heart while you are sitting or lying down.  If your puncture wound is in your foot, ask your doctor if you need to avoid putting weight on your foot and for how long.  Keep all follow-up visits as told by your doctor. This is important. GET HELP IF:  You got a tetanus shot and you have any of these problems at the injection site:  Swelling.  Very bad  pain.  Redness.  Bleeding.  You have a fever.  Your stitches come out.  You notice a bad smell coming from your wound or your bandage.  You notice something coming out of the wound, such as wood or glass.  Medicine does not help your pain.  You have more redness, swelling, or pain at the site of your wound.  You have fluid, blood, or pus coming from your wound.  You notice a change in the color of your skin near your wound.  You need to change the bandage often because fluid, blood, or pus is coming from the wound.  You start to have a new rash.  You start to have numbness around the wound. GET HELP RIGHT AWAY IF:  You have very bad swelling around the wound.  Your pain suddenly gets worse and is very bad.  You start to get painful skin lumps.  You have a red streak going away from your wound.  The wound is on your hand or foot and you cannot move a finger or toe like you usually can.  The wound is on your hand or foot and you notice that your fingers or toes look pale or bluish.   This information is not intended to replace advice given to you by your health care provider. Make sure you discuss any questions you have with your health care provider.   Document  Released: 05/11/2008 Document Revised: 04/23/2015 Document Reviewed: 09/25/2014 Elsevier Interactive Patient Education 2016 [employer]. Diphtheria/Tetanus Toxoids; Pertussis Vaccine, DTP injection What is this medicine? DIPHTHERIA and TETANUS TOXOIDS; PERTUSSIS VACCINE (dif THEER ee uh and TET n [place] TOK soids; per TUS iss VAK seen) is used to prevent diphtheria, tetanus, and pertussis infections. This medicine may be used for other purposes; ask your health care provider or pharmacist if you have questions. What should I tell my health care provider before I take this medicine? They need to know if you have any of these conditions: -blood disorders like hemophilia -fever or infection -immune system  problems -neurologic disease -seizures -an unusual or allergic reaction to vaccines, thimerosal, latex, other medicines, foods, dyes, or preservatives -pregnant or trying to get pregnant -breast-feeding How should I use this medicine? This vaccine is for injection into a muscle. It is given by a health care professional. A copy of Vaccine Information Statements will be given before each vaccination. Read this sheet carefully each time. The sheet may change frequently. Talk to your pediatrician regarding the use of this vaccine in children. While the DTP vaccine may be given to children ages 58 weeks to 7 years and the Tdap vaccine may be given to children at least 69 years old, precautions do apply. Overdosage: If you think you have taken too much of this medicine contact a poison control center or emergency room at once. NOTE: This medicine is only for you. Do not share this medicine with others. What if I miss a dose? It is important not to miss your dose. Call your doctor or health care professional if you are unable to keep an appointment. What may interact with this medicine? -immune globulin -medicines that suppress your immune function like adalimumab, anakinra, infliximab -medicines to treat cancer -medicines that treat or prevent blood clots like warfarin, enoxaparin, and dalteparin -steroid medicines like prednisone or cortisone This list may not describe all possible interactions. Give your health care provider a list of all the medicines, herbs, non-prescription drugs, or dietary supplements you use. Also tell them if you smoke, drink alcohol, or use illegal drugs. Some items may interact with your medicine. What should I watch for while using this medicine? See your health care provider for all shots of this vaccine as directed. To have protection from infection, you must have 3 shots of this vaccine plus boosters as needed. Tell your doctor right away if you have any serious or  unusual side effects after getting this vaccine. What side effects may I notice from receiving this medicine? Side effects that you should report to your doctor or health care professional as soon as possible: -allergic reactions like skin rash, itching or hives, swelling of the face, lips, or tongue -breathing problems -fever of 103 degrees F or more -flu-like symptoms -inconsolable crying -infection -pain, tingling, numbness in the hands or feet -seizures -swelling of arm or leg that was injected -unusually weak or tired Side effects that usually do not require immediate medical attention (report these side effects to your doctor or health care professional if they continue or are bothersome): -fussy, irritable -loss of appetite -fever of 102 degrees F or less -pain, tenderness, redness, swelling, or a 'knot' at site where injected -vomiting This list may not describe all possible side effects. Call your doctor for medical advice about side effects. You may report side effects to FDA at 1-800-FDA-1088. Where should I keep my medicine? This drug is given in a hospital  or clinic and will not be stored at home. NOTE: This sheet is a summary. It may not cover all possible information. If you have questions about this medicine, talk to your doctor, pharmacist, or health care provider.    2016, Elsevier/Gold Standard. (2013-12-03 21:24:02)

## 2015-11-15 NOTE — ED Notes (Signed)
Patient c/o puncture wound on her left pinky by a nail onset today. Patient is in NAD. Minimal bleeding is controlled.

## 2016-02-20 DIAGNOSIS — H4010X0 Unspecified open-angle glaucoma, stage unspecified: Secondary | ICD-10-CM | POA: Diagnosis not present

## 2016-02-20 DIAGNOSIS — H25813 Combined forms of age-related cataract, bilateral: Secondary | ICD-10-CM | POA: Diagnosis not present

## 2016-03-19 DIAGNOSIS — H401130 Primary open-angle glaucoma, bilateral, stage unspecified: Secondary | ICD-10-CM | POA: Diagnosis not present

## 2016-04-09 DIAGNOSIS — H401130 Primary open-angle glaucoma, bilateral, stage unspecified: Secondary | ICD-10-CM | POA: Diagnosis not present

## 2016-05-28 DIAGNOSIS — H401130 Primary open-angle glaucoma, bilateral, stage unspecified: Secondary | ICD-10-CM | POA: Diagnosis not present

## 2016-06-10 ENCOUNTER — Encounter (HOSPITAL_COMMUNITY): Payer: Self-pay | Admitting: Emergency Medicine

## 2016-06-10 ENCOUNTER — Ambulatory Visit (HOSPITAL_COMMUNITY)
Admission: EM | Admit: 2016-06-10 | Discharge: 2016-06-10 | Disposition: A | Discharge status: Home / Self Care | Payer: Medicare Other | Attending: Internal Medicine | Admitting: Internal Medicine

## 2016-06-10 DIAGNOSIS — R0689 Other abnormalities of breathing: Secondary | ICD-10-CM | POA: Diagnosis not present

## 2016-06-10 DIAGNOSIS — R0989 Other specified symptoms and signs involving the circulatory and respiratory systems: Secondary | ICD-10-CM
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
## 2016-06-10 NOTE — ED Triage Notes (Signed)
PT was drinking lemon water and "inhaled" a lemon seed. PT believes it is lodged in left side of throat. PT has not had anything to drink since it happened. PT believes it is in airway vs. Esophagus.

## 2016-06-10 NOTE — Discharge Instructions (Addendum)
Symptoms today seem most consistent with temporary presence of foreign body, that is no longer present.  This feeling should resolved over the next day or two.  Followup with ENT in about a week if foreign body sensation persists.  Recheck for increasing cough, new fever >100.5

## 2016-06-23 ENCOUNTER — Ambulatory Visit
Admission: RE | Admit: 2016-06-23 | Discharge: 2016-06-23 | Disposition: A | Discharge status: Home / Self Care | Payer: Medicare Other | Source: Ambulatory Visit | Attending: Otolaryngology | Admitting: Otolaryngology

## 2016-06-23 ENCOUNTER — Other Ambulatory Visit: Payer: Self-pay | Admitting: Otolaryngology

## 2016-06-23 DIAGNOSIS — T17808A Unspecified foreign body in other parts of respiratory tract causing other injury, initial encounter: Secondary | ICD-10-CM

## 2016-06-23 DIAGNOSIS — R05 Cough: Secondary | ICD-10-CM | POA: Diagnosis not present

## 2016-07-12 DIAGNOSIS — I10 Essential (primary) hypertension: Secondary | ICD-10-CM | POA: Diagnosis not present

## 2016-07-28 IMAGING — CT CT HEAD W/O CM
1 series · 16 of 30 positions shown, 20 images · non-contrast
Comparison: MRI brain dated 03/10/2013

CLINICAL DATA: Garbled speech x1 week, vertigo

EXAM:
CT HEAD WITHOUT CONTRAST
TECHNIQUE: Contiguous axial images were obtained from the base of the skull
through the vertex without intravenous contrast.

[Series 2: head 5.0 h31s · axial · 0.42mm/px · z∈[-167,-32]mm · 16 of 30 slices shown, 20 images]
[im 2/30  brain]
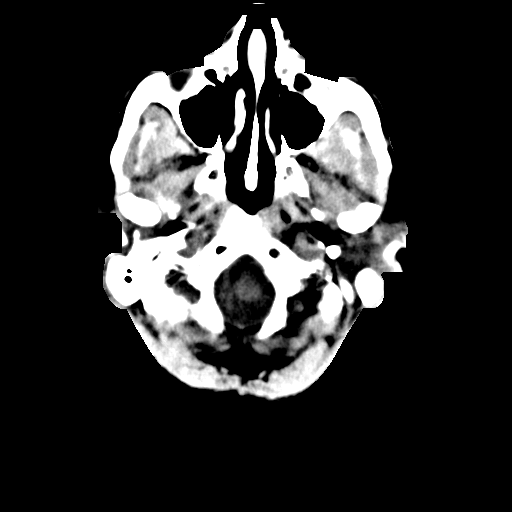
[im 2/30  bone]
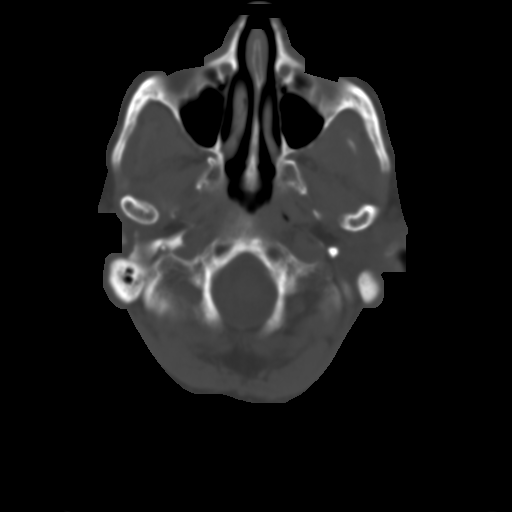
[im 4/30  brain]
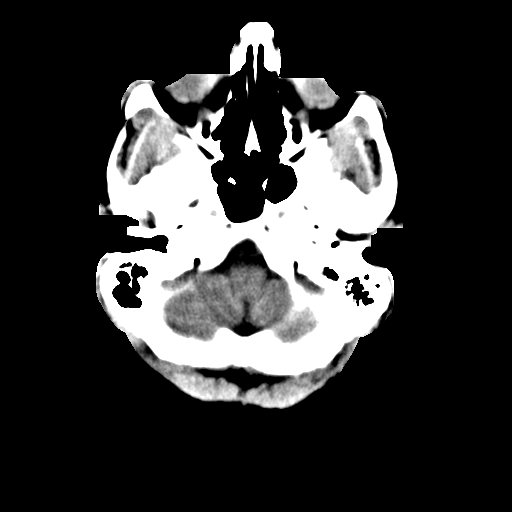
[im 6/30  brain]
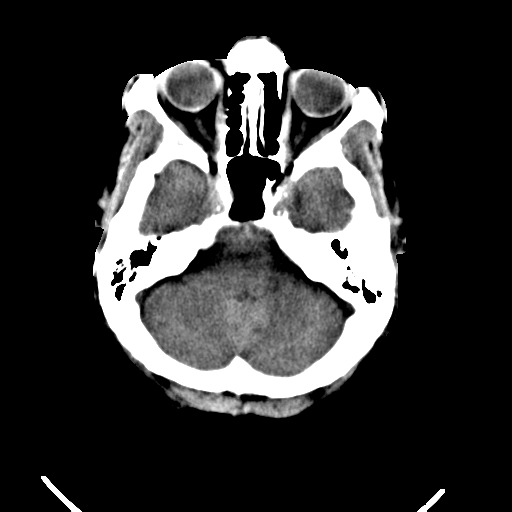
[im 8/30  brain]
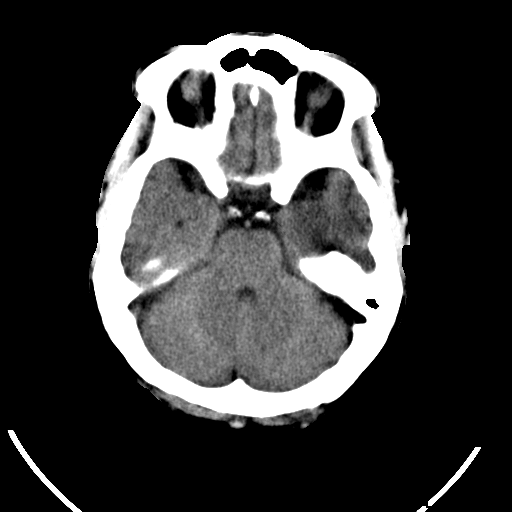
[im 9/30  brain]
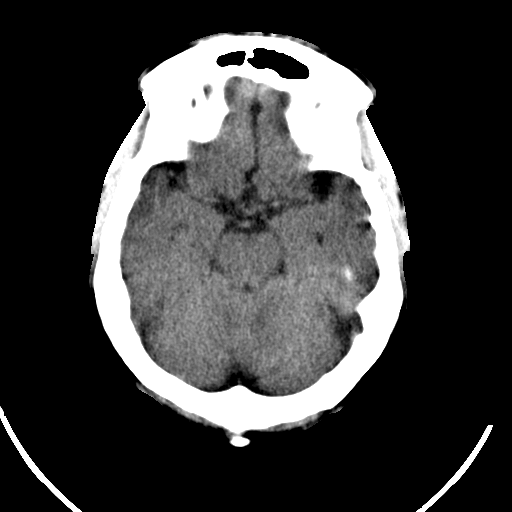
[im 9/30  bone]
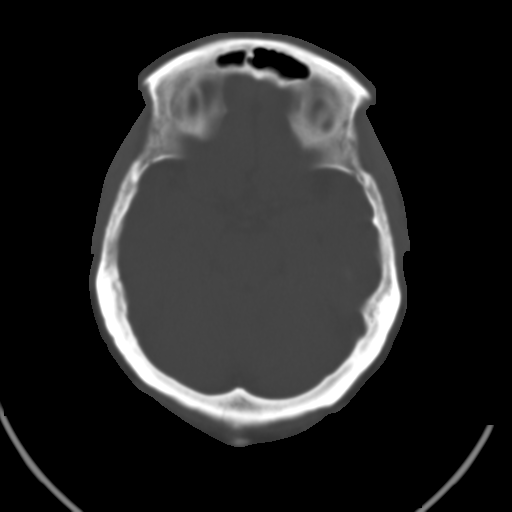
[im 11/30  brain]
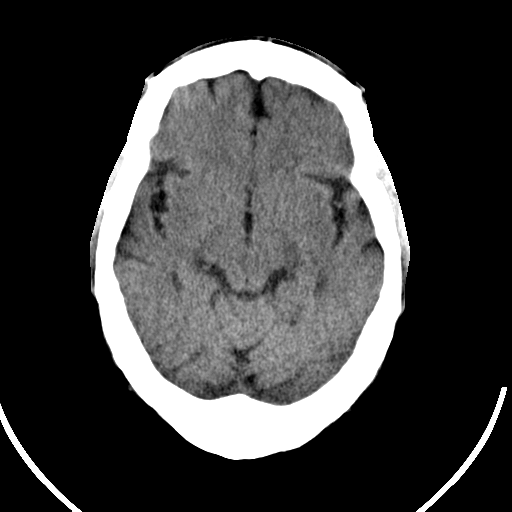
[im 13/30  brain]
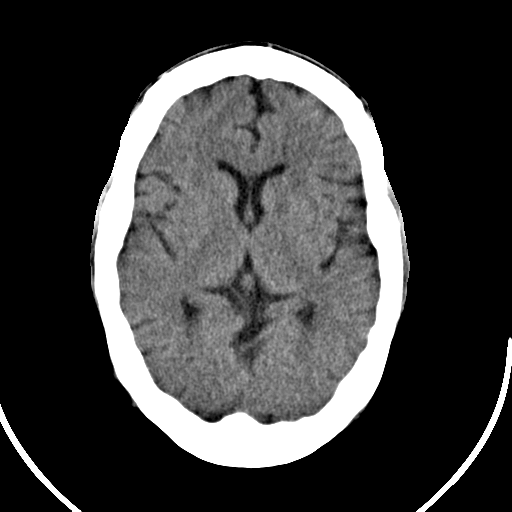
[im 15/30  brain]
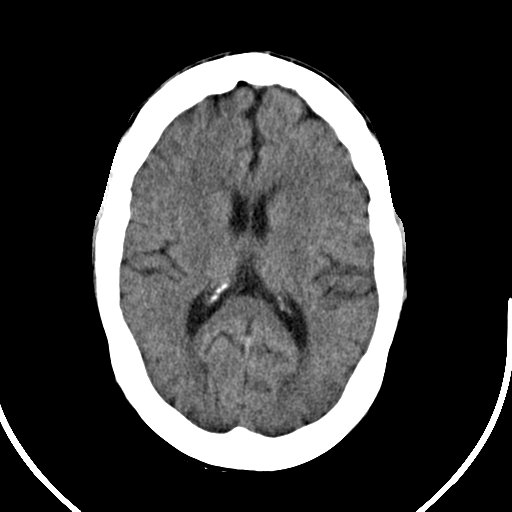
[im 16/30  brain]
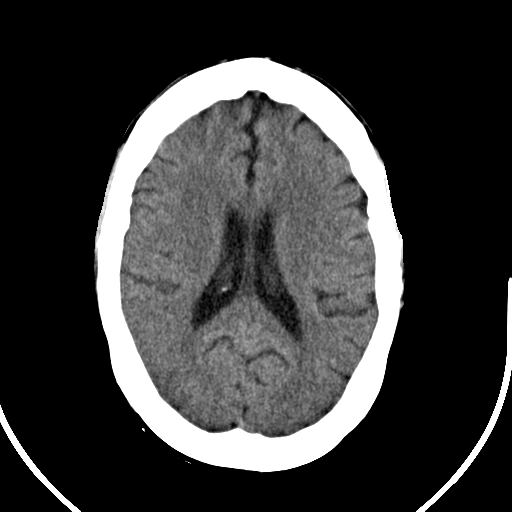
[im 16/30  bone]
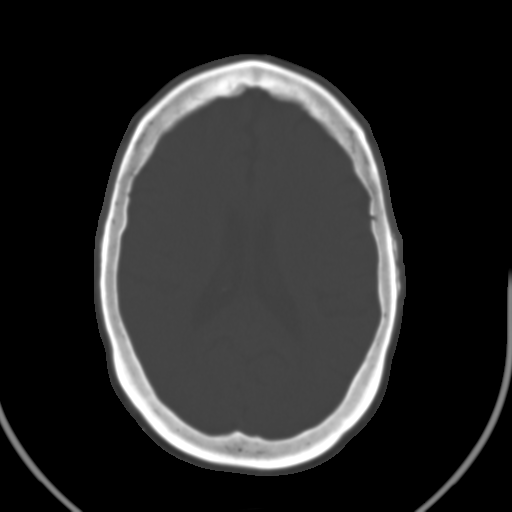
[im 18/30  brain]
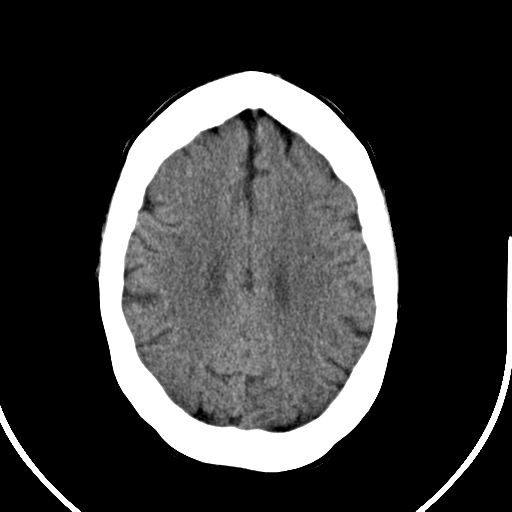
[im 20/30  brain]
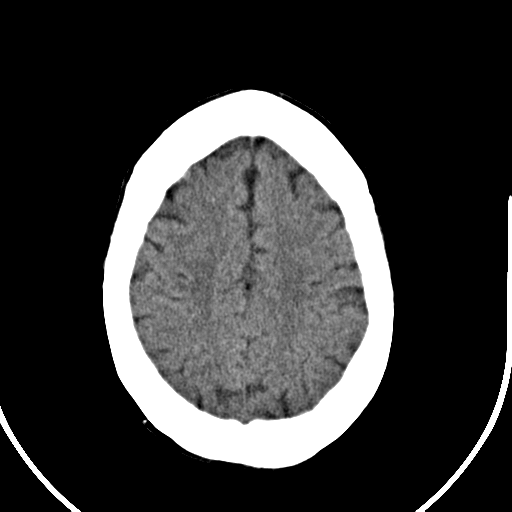
[im 22/30  brain]
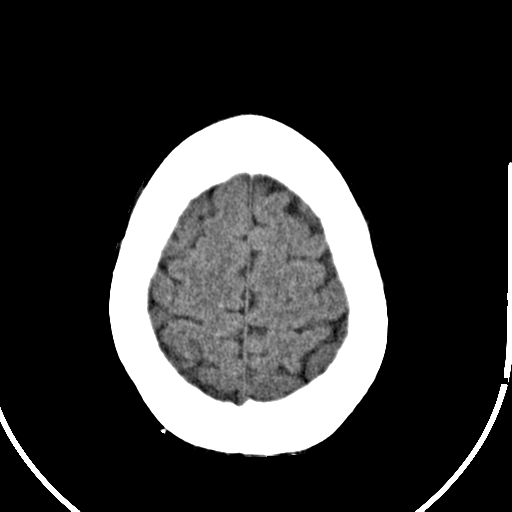
[im 23/30  brain]
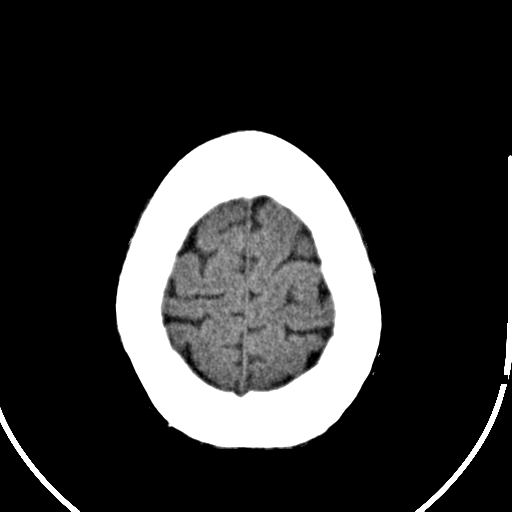
[im 23/30  bone]
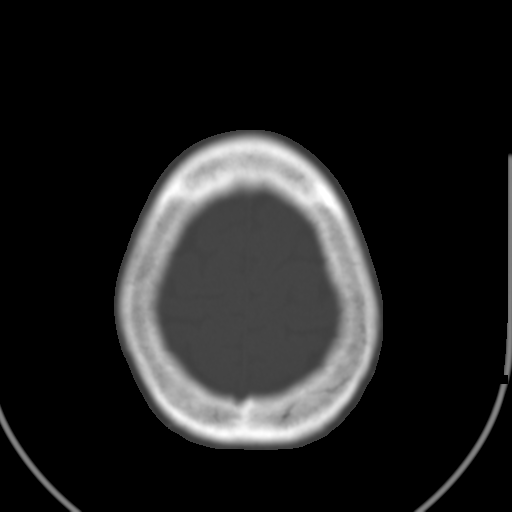
[im 25/30  brain]
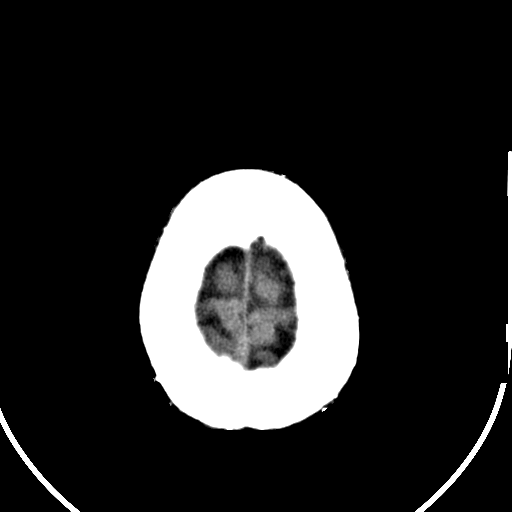
[im 27/30  brain]
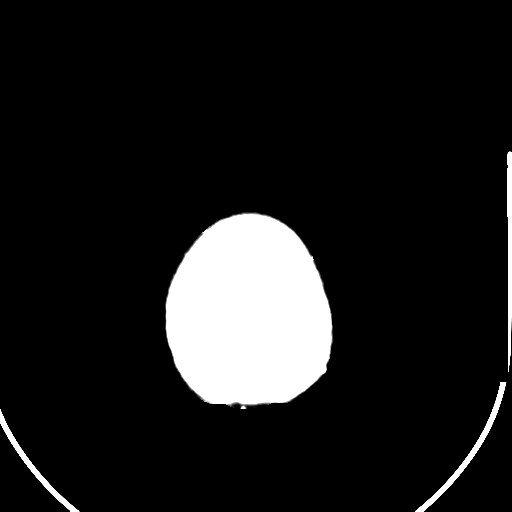
[im 29/30  brain]
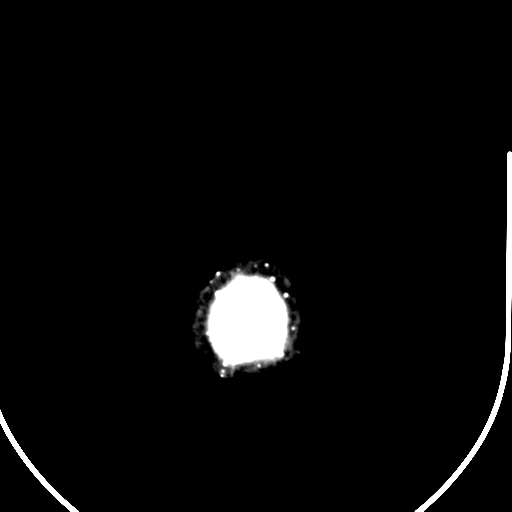

[16 of 30 positions shown; findings below may reference images not displayed]

FINDINGS: No evidence of parenchymal hemorrhage or extra-axial fluid
collection. No mass lesion, mass effect, or midline shift.

No CT evidence of acute infarction.

Cerebral volume is within normal limits.  No ventriculomegaly.

The visualized paranasal sinuses are essentially clear. The mastoid
air cells are unopacified.

No evidence of calvarial fracture.
IMPRESSION: Normal head CT.

These results were called by telephone at the time of interpretation
on 10/09/2014 at [DATE] to Dr. Blain, who verbally acknowledged
these results.

## 2016-07-30 DIAGNOSIS — H2513 Age-related nuclear cataract, bilateral: Secondary | ICD-10-CM | POA: Diagnosis not present

## 2016-07-30 DIAGNOSIS — H401130 Primary open-angle glaucoma, bilateral, stage unspecified: Secondary | ICD-10-CM | POA: Diagnosis not present

## 2016-08-06 DIAGNOSIS — Z01818 Encounter for other preprocedural examination: Secondary | ICD-10-CM | POA: Diagnosis not present

## 2016-08-06 DIAGNOSIS — R001 Bradycardia, unspecified: Secondary | ICD-10-CM | POA: Diagnosis not present

## 2016-08-15 ENCOUNTER — Emergency Department (HOSPITAL_COMMUNITY): Payer: Medicare Other

## 2016-08-15 ENCOUNTER — Emergency Department (HOSPITAL_COMMUNITY)
Admission: EM | Admit: 2016-08-15 | Discharge: 2016-08-15 | Disposition: A | Discharge status: Home / Self Care | Payer: Medicare Other | Attending: Emergency Medicine | Admitting: Emergency Medicine

## 2016-08-15 ENCOUNTER — Encounter (HOSPITAL_COMMUNITY): Payer: Self-pay | Admitting: Emergency Medicine

## 2016-08-15 DIAGNOSIS — R51 Headache: Secondary | ICD-10-CM | POA: Diagnosis present

## 2016-08-15 DIAGNOSIS — R42 Dizziness and giddiness: Secondary | ICD-10-CM | POA: Insufficient documentation

## 2016-08-15 DIAGNOSIS — Z8673 Personal history of transient ischemic attack (TIA), and cerebral infarction without residual deficits: Secondary | ICD-10-CM | POA: Insufficient documentation

## 2016-08-15 DIAGNOSIS — H81392 Other peripheral vertigo, left ear: Secondary | ICD-10-CM

## 2016-08-15 DIAGNOSIS — I2510 Atherosclerotic heart disease of native coronary artery without angina pectoris: Secondary | ICD-10-CM | POA: Diagnosis not present

## 2016-08-15 DIAGNOSIS — R269 Unspecified abnormalities of gait and mobility: Secondary | ICD-10-CM | POA: Insufficient documentation

## 2016-08-15 DIAGNOSIS — I10 Essential (primary) hypertension: Secondary | ICD-10-CM | POA: Diagnosis not present

## 2016-08-15 LAB — CBC WITH DIFFERENTIAL/PLATELET
BASOS ABS: 0 10*3/uL (ref 0.0–0.1)
BASOS PCT: 1 %
Eosinophils Absolute: 0.1 10*3/uL (ref 0.0–0.7)
Eosinophils Relative: 2 %
HCT: 37.7 % (ref 36.0–46.0)
Hemoglobin: 12.6 g/dL (ref 12.0–15.0)
LYMPHS PCT: 51 %
Lymphs Abs: 2.5 10*3/uL (ref 0.7–4.0)
MCH: 29.3 pg (ref 26.0–34.0)
MCHC: 33.4 g/dL (ref 30.0–36.0)
MCV: 87.7 fL (ref 78.0–100.0)
Monocytes Absolute: 0.4 10*3/uL (ref 0.1–1.0)
Monocytes Relative: 7 %
NEUTROS ABS: 2 10*3/uL (ref 1.7–7.7)
NEUTROS PCT: 39 %
Platelets: 240 10*3/uL (ref 150–400)
RBC: 4.3 MIL/uL (ref 3.87–5.11)
RDW: 13.4 % (ref 11.5–15.5)
WBC: 5 10*3/uL (ref 4.0–10.5)

## 2016-08-15 LAB — COMPREHENSIVE METABOLIC PANEL
ALT: 20 U/L (ref 14–54)
AST: 30 U/L (ref 15–41)
Albumin: 3.7 g/dL (ref 3.5–5.0)
Alkaline Phosphatase: 74 U/L (ref 38–126)
Anion gap: 8 (ref 5–15)
BILIRUBIN TOTAL: 0.5 mg/dL (ref 0.3–1.2)
CO2: 29 mmol/L (ref 22–32)
CREATININE: 0.99 mg/dL (ref 0.44–1.00)
Calcium: 9.7 mg/dL (ref 8.9–10.3)
Chloride: 96 mmol/L — ABNORMAL LOW (ref 101–111)
GFR calc Af Amer: >60 mL/min (ref >60)
GFR calc non Af Amer: 56 mL/min — ABNORMAL LOW (ref >60)
Glucose, Bld: 92 mg/dL (ref 65–99)
POTASSIUM: 3.2 mmol/L — AB (ref 3.5–5.1)
Sodium: 133 mmol/L — ABNORMAL LOW (ref 135–145)
TOTAL PROTEIN: 7.1 g/dL (ref 6.5–8.1)

## 2016-08-15 MED ORDER — DIPHENHYDRAMINE HCL 50 MG/ML IJ SOLN: 25.0000 mg | Freq: Once | INTRAMUSCULAR | Status: DC

## 2016-08-15 MED ORDER — DIPHENHYDRAMINE HCL 50 MG/ML IJ SOLN
12.5000 mg | Freq: Once | INTRAMUSCULAR | Status: AC
  Administered 2016-08-15: 12.5 mg via INTRAVENOUS
  Filled 2016-08-15: qty 1

## 2016-08-15 MED ORDER — PROCHLORPERAZINE EDISYLATE 5 MG/ML IJ SOLN
10.0000 mg | Freq: Once | INTRAMUSCULAR | Status: AC
  Administered 2016-08-15: 10 mg via INTRAVENOUS
  Filled 2016-08-15: qty 2

## 2016-08-15 MED ORDER — MECLIZINE HCL 25 MG PO TABS: 25.0000 mg | ORAL_TABLET | Freq: Three times a day (TID) | ORAL | 0 refills | Status: DC | PRN

## 2016-08-15 MED ORDER — SODIUM CHLORIDE 0.9 % IV BOLUS (SEPSIS)
1000.0000 mL | Freq: Once | INTRAVENOUS | Status: AC
  Administered 2016-08-15: 1000 mL via INTRAVENOUS

## 2016-08-15 NOTE — Discharge Instructions (Signed)
Follow up with your PCP, they may refer you to neurology to see if these are complicated migraines.

## 2016-08-15 NOTE — ED Triage Notes (Signed)
Pt. reports unsteady gait with mild headache this morning , slight dizziness while at triage , alert and oriented , speech clear with no facial asymmetry , equal strong grips with no arm drift.
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
## 2016-08-17 DIAGNOSIS — H2512 Age-related nuclear cataract, left eye: Secondary | ICD-10-CM | POA: Diagnosis not present

## 2016-08-17 DIAGNOSIS — H52222 Regular astigmatism, left eye: Secondary | ICD-10-CM | POA: Diagnosis not present

## 2016-08-17 DIAGNOSIS — H401130 Primary open-angle glaucoma, bilateral, stage unspecified: Secondary | ICD-10-CM | POA: Diagnosis not present

## 2016-08-17 DIAGNOSIS — Z780 Asymptomatic menopausal state: Secondary | ICD-10-CM | POA: Diagnosis not present

## 2016-08-17 DIAGNOSIS — H2513 Age-related nuclear cataract, bilateral: Secondary | ICD-10-CM | POA: Diagnosis not present

## 2016-08-17 DIAGNOSIS — Z79899 Other long term (current) drug therapy: Secondary | ICD-10-CM | POA: Diagnosis not present

## 2016-08-17 DIAGNOSIS — H2589 Other age-related cataract: Secondary | ICD-10-CM | POA: Diagnosis not present

## 2016-08-17 DIAGNOSIS — M810 Age-related osteoporosis without current pathological fracture: Secondary | ICD-10-CM | POA: Diagnosis not present

## 2016-08-17 DIAGNOSIS — I10 Essential (primary) hypertension: Secondary | ICD-10-CM | POA: Diagnosis not present

## 2016-08-25 DIAGNOSIS — H401130 Primary open-angle glaucoma, bilateral, stage unspecified: Secondary | ICD-10-CM | POA: Diagnosis not present

## 2016-10-09 ENCOUNTER — Encounter (HOSPITAL_COMMUNITY): Payer: Self-pay | Admitting: Emergency Medicine

## 2016-10-09 ENCOUNTER — Emergency Department (HOSPITAL_COMMUNITY)
Admission: EM | Admit: 2016-10-09 | Discharge: 2016-10-09 | Disposition: A | Discharge status: Home / Self Care | Payer: Medicare Other | Attending: Emergency Medicine | Admitting: Emergency Medicine

## 2016-10-09 DIAGNOSIS — I10 Essential (primary) hypertension: Secondary | ICD-10-CM | POA: Diagnosis not present

## 2016-10-09 DIAGNOSIS — Z91010 Allergy to peanuts: Secondary | ICD-10-CM | POA: Insufficient documentation

## 2016-10-09 DIAGNOSIS — Z8673 Personal history of transient ischemic attack (TIA), and cerebral infarction without residual deficits: Secondary | ICD-10-CM | POA: Insufficient documentation

## 2016-10-09 DIAGNOSIS — I2510 Atherosclerotic heart disease of native coronary artery without angina pectoris: Secondary | ICD-10-CM | POA: Diagnosis not present

## 2016-10-09 DIAGNOSIS — Z91040 Latex allergy status: Secondary | ICD-10-CM | POA: Diagnosis not present

## 2016-10-09 DIAGNOSIS — Z79899 Other long term (current) drug therapy: Secondary | ICD-10-CM | POA: Insufficient documentation

## 2016-10-09 LAB — CBC
HEMATOCRIT: 38.8 % (ref 36.0–46.0)
HEMOGLOBIN: 13.1 g/dL (ref 12.0–15.0)
MCH: 30.3 pg (ref 26.0–34.0)
MCHC: 33.8 g/dL (ref 30.0–36.0)
MCV: 89.8 fL (ref 78.0–100.0)
Platelets: 237 10*3/uL (ref 150–400)
RBC: 4.32 MIL/uL (ref 3.87–5.11)
RDW: 14 % (ref 11.5–15.5)
WBC: 6.4 10*3/uL (ref 4.0–10.5)

## 2016-10-09 LAB — BASIC METABOLIC PANEL
ANION GAP: 14 (ref 5–15)
BUN: <5 mg/dL — ABNORMAL LOW (ref 6–20)
CHLORIDE: 95 mmol/L — AB (ref 101–111)
CO2: 23 mmol/L (ref 22–32)
Calcium: 9.6 mg/dL (ref 8.9–10.3)
Creatinine, Ser: 0.93 mg/dL (ref 0.44–1.00)
GFR calc Af Amer: >60 mL/min (ref >60)
GFR calc non Af Amer: 60 mL/min — ABNORMAL LOW (ref >60)
GLUCOSE: 99 mg/dL (ref 65–99)
POTASSIUM: 3.4 mmol/L — AB (ref 3.5–5.1)
Sodium: 132 mmol/L — ABNORMAL LOW (ref 135–145)

## 2016-10-09 MED ORDER — POTASSIUM CHLORIDE CRYS ER 20 MEQ PO TBCR
40.0000 meq | EXTENDED_RELEASE_TABLET | Freq: Once | ORAL | Status: AC
  Administered 2016-10-09: 40 meq via ORAL
  Filled 2016-10-09: qty 2
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
## 2016-10-09 NOTE — Discharge Instructions (Signed)
° °  If you were given medicines take as directed.  If you are on coumadin or contraceptives realize their levels and effectiveness is altered by many different medicines.  If you have any reaction (rash, tongues swelling, other) to the medicines stop taking and see a physician.    If your blood pressure was elevated in the ER make sure you follow up for management with a primary doctor or return for chest pain, shortness of breath or stroke symptoms.  Please follow up as directed and return to the ER or see a physician for new or worsening symptoms.  Thank you. Vitals:   10/09/16 1756 10/09/16 2104 10/09/16 2134  BP: 178/84 173/76 179/84  Pulse: 72 65 62  Resp: 16  23  Temp: 98.5 F (36.9 C)    TempSrc: Oral    SpO2: 100% 97% 97%  Weight: 120 lb (54.4 kg)    Height: 5' (1.524 m)

## 2016-10-09 NOTE — ED Triage Notes (Signed)
Pt reports that she has been dealing with high blood pressure readings for awhile. Last night pt BP was [id number] systolic. Pt has tried white vinegar which lowered her BP 166/90. Pt began to slurred speech which have resolved. Pt reports blurred vision noted to right eye today. Pt c/o generalized weakness.

## 2016-10-09 NOTE — ED Notes (Signed)
Pt requested her BP to be recheck while waiting

## 2016-10-29 DIAGNOSIS — Z9842 Cataract extraction status, left eye: Secondary | ICD-10-CM | POA: Diagnosis not present

## 2016-12-23 ENCOUNTER — Ambulatory Visit (HOSPITAL_COMMUNITY)
Admission: EM | Admit: 2016-12-23 | Discharge: 2016-12-23 | Disposition: A | Discharge status: Home / Self Care | Payer: Medicare Other | Attending: Internal Medicine | Admitting: Internal Medicine

## 2016-12-23 ENCOUNTER — Encounter (HOSPITAL_COMMUNITY): Payer: Self-pay | Admitting: Emergency Medicine

## 2016-12-23 DIAGNOSIS — M545 Low back pain: Secondary | ICD-10-CM

## 2016-12-23 DIAGNOSIS — B349 Viral infection, unspecified: Secondary | ICD-10-CM

## 2016-12-23 DIAGNOSIS — S90562A Insect bite (nonvenomous), left ankle, initial encounter: Secondary | ICD-10-CM

## 2016-12-23 DIAGNOSIS — W57XXXA Bitten or stung by nonvenomous insect and other nonvenomous arthropods, initial encounter: Secondary | ICD-10-CM

## 2016-12-23 LAB — POCT URINALYSIS DIP (DEVICE)
Bilirubin Urine: NEGATIVE
GLUCOSE, UA: NEGATIVE mg/dL
KETONES UR: NEGATIVE mg/dL
LEUKOCYTES UA: NEGATIVE
Nitrite: NEGATIVE
PROTEIN: NEGATIVE mg/dL
Specific Gravity, Urine: 1.01 (ref 1.005–1.030)
Urobilinogen, UA: 0.2 mg/dL (ref 0.0–1.0)
pH: 6 (ref 5.0–8.0)

## 2016-12-23 NOTE — Discharge Instructions (Addendum)
Bite site inner left ankle looks fine.  Recheck for any increasing redness/swelling/drainage/pain at site or new fever >100.5.   Anticipate gradual improvement in nausea/loose stools/crampiness/achiness over the next several days.  Recheck for worsening crampiness/diarrhea/vomiting, or if not continuing to improve.  Push fluids and rest.   Urine test had trace hemoglobin, which is often a contaminant.  A microscopic evaluation, to look for red blood cells, is pending.  The urgent care will contact you if further evaluation is needed.

## 2016-12-23 NOTE — ED Triage Notes (Signed)
Pt reports poss spider bite 7 days ago to left foot  Since then, has had n/diarrhea, cramps, chills, abd pain, back pain  Denies fevers, urinary sx  A&O x4... NAD
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
## 2017-01-14 ENCOUNTER — Ambulatory Visit (HOSPITAL_COMMUNITY)
Admission: EM | Admit: 2017-01-14 | Discharge: 2017-01-14 | Disposition: A | Discharge status: Home / Self Care | Payer: Medicare Other | Attending: Family Medicine | Admitting: Family Medicine

## 2017-01-14 ENCOUNTER — Encounter (HOSPITAL_COMMUNITY): Payer: Self-pay

## 2017-01-14 DIAGNOSIS — E860 Dehydration: Secondary | ICD-10-CM | POA: Diagnosis not present

## 2017-01-14 LAB — POCT URINALYSIS DIP (DEVICE)
BILIRUBIN URINE: NEGATIVE
GLUCOSE, UA: NEGATIVE mg/dL
Hgb urine dipstick: NEGATIVE
KETONES UR: NEGATIVE mg/dL
Leukocytes, UA: NEGATIVE
Nitrite: NEGATIVE
PROTEIN: NEGATIVE mg/dL
SPECIFIC GRAVITY, URINE: 1.015 (ref 1.005–1.030)
Urobilinogen, UA: 0.2 mg/dL (ref 0.0–1.0)
pH: 7.5 (ref 5.0–8.0)
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
## 2017-01-14 NOTE — ED Triage Notes (Signed)
Pt having low back pain since this morning along with nausea and feeling bad. Said she was told she almost a month ago she has hematuria. Said she is pushing fluids but no urinary symptoms. Also unsteady on her feet and dizziness. No otc meds tried.

## 2017-01-14 NOTE — Discharge Instructions (Signed)
Try to drink sports drinks during the nighttime if you go back to fasting.   Make appt this coming week to discuss possible alternatives to hydrochlorothiazide (your water pill) if you decide to fast again.  There is no evidence of blood in your urine. Follow up with PCP if you notice any bleeding or other urinary complaints.

## 2017-01-29 ENCOUNTER — Emergency Department (HOSPITAL_COMMUNITY)
Admission: EM | Admit: 2017-01-29 | Discharge: 2017-01-30 | Disposition: A | Discharge status: Home / Self Care | Payer: Medicare Other | Attending: Emergency Medicine | Admitting: Emergency Medicine

## 2017-01-29 ENCOUNTER — Encounter (HOSPITAL_COMMUNITY): Payer: Self-pay

## 2017-01-29 DIAGNOSIS — Z79899 Other long term (current) drug therapy: Secondary | ICD-10-CM | POA: Insufficient documentation

## 2017-01-29 DIAGNOSIS — Z91040 Latex allergy status: Secondary | ICD-10-CM | POA: Insufficient documentation

## 2017-01-29 DIAGNOSIS — I2510 Atherosclerotic heart disease of native coronary artery without angina pectoris: Secondary | ICD-10-CM | POA: Insufficient documentation

## 2017-01-29 DIAGNOSIS — T7840XA Allergy, unspecified, initial encounter: Secondary | ICD-10-CM | POA: Insufficient documentation

## 2017-01-29 DIAGNOSIS — M7989 Other specified soft tissue disorders: Secondary | ICD-10-CM | POA: Diagnosis not present

## 2017-01-29 DIAGNOSIS — Z8673 Personal history of transient ischemic attack (TIA), and cerebral infarction without residual deficits: Secondary | ICD-10-CM | POA: Diagnosis not present

## 2017-01-29 DIAGNOSIS — I10 Essential (primary) hypertension: Secondary | ICD-10-CM | POA: Insufficient documentation

## 2017-01-29 DIAGNOSIS — Z91010 Allergy to peanuts: Secondary | ICD-10-CM | POA: Insufficient documentation

## 2017-01-29 NOTE — ED Triage Notes (Signed)
Pt complaining of swollen throat. Pt states having allergic reaction to peanuts. Pt states took pepcid and benedryl PTA. Airway intact. No wheezing or stridor noted at triage. Pt a/o x 4. VSS. No rash or itching to body.

## 2017-01-30 ENCOUNTER — Emergency Department (HOSPITAL_COMMUNITY): Payer: Medicare Other

## 2017-01-30 DIAGNOSIS — T7840XA Allergy, unspecified, initial encounter: Secondary | ICD-10-CM | POA: Diagnosis not present

## 2017-01-30 DIAGNOSIS — M7989 Other specified soft tissue disorders: Secondary | ICD-10-CM | POA: Diagnosis not present

## 2017-01-30 MED ORDER — PREDNISONE 50 MG PO TABS: ORAL_TABLET | ORAL | 0 refills | Status: DC

## 2017-01-30 MED ORDER — EPINEPHRINE 0.3 MG/0.3ML IJ SOAJ: 0.3000 mg | Freq: Once | INTRAMUSCULAR | 0 refills | Status: AC

## 2017-01-30 MED ORDER — METHYLPREDNISOLONE SODIUM SUCC 125 MG IJ SOLR
125.0000 mg | Freq: Once | INTRAMUSCULAR | Status: AC
  Administered 2017-01-30: 125 mg via INTRAVENOUS
  Filled 2017-01-30: qty 2

## 2017-01-30 MED ORDER — FAMOTIDINE IN NACL 20-0.9 MG/50ML-% IV SOLN
20.0000 mg | Freq: Once | INTRAVENOUS | Status: AC
  Administered 2017-01-30: 20 mg via INTRAVENOUS
  Filled 2017-01-30: qty 50
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
## 2017-01-30 NOTE — ED Notes (Signed)
Patient transported to X-ray 

## 2017-01-30 NOTE — ED Notes (Signed)
Pt reports having a peanut allergy and she had some food that was cooked in peanut oil. Pt denies any rash or hives but does report the feeling that her throat was closing. Pt advised she took a bunch of benadryl at home with relief but the throat closing feeling came back.

## 2017-01-30 NOTE — Discharge Instructions (Signed)
Take the steroids as prescribed. Use the epinephrine pen only as needed for severe allergic reaction including tongue or lip swelling or difficulty breathing or swallowing. Follow up with your doctor. Return to the ED if you develop new or worsening symptoms.

## 2017-02-04 DIAGNOSIS — H2513 Age-related nuclear cataract, bilateral: Secondary | ICD-10-CM | POA: Diagnosis not present

## 2017-02-04 DIAGNOSIS — H4010X0 Unspecified open-angle glaucoma, stage unspecified: Secondary | ICD-10-CM | POA: Diagnosis not present

## 2017-03-15 DIAGNOSIS — I10 Essential (primary) hypertension: Secondary | ICD-10-CM | POA: Diagnosis not present

## 2017-03-15 DIAGNOSIS — Z780 Asymptomatic menopausal state: Secondary | ICD-10-CM | POA: Diagnosis not present

## 2017-03-15 DIAGNOSIS — H2511 Age-related nuclear cataract, right eye: Secondary | ICD-10-CM | POA: Diagnosis not present

## 2017-03-15 DIAGNOSIS — G4730 Sleep apnea, unspecified: Secondary | ICD-10-CM | POA: Diagnosis not present

## 2017-03-15 DIAGNOSIS — Z79899 Other long term (current) drug therapy: Secondary | ICD-10-CM | POA: Diagnosis not present

## 2017-03-15 DIAGNOSIS — Z8673 Personal history of transient ischemic attack (TIA), and cerebral infarction without residual deficits: Secondary | ICD-10-CM | POA: Diagnosis not present

## 2017-03-15 DIAGNOSIS — I2510 Atherosclerotic heart disease of native coronary artery without angina pectoris: Secondary | ICD-10-CM | POA: Diagnosis not present

## 2017-03-15 DIAGNOSIS — H52221 Regular astigmatism, right eye: Secondary | ICD-10-CM | POA: Diagnosis not present

## 2017-03-15 DIAGNOSIS — H25811 Combined forms of age-related cataract, right eye: Secondary | ICD-10-CM | POA: Diagnosis not present

## 2017-04-22 DIAGNOSIS — H35363 Drusen (degenerative) of macula, bilateral: Secondary | ICD-10-CM | POA: Diagnosis not present

## 2017-04-24 DIAGNOSIS — J020 Streptococcal pharyngitis: Secondary | ICD-10-CM | POA: Diagnosis not present

## 2017-05-02 ENCOUNTER — Other Ambulatory Visit: Payer: Self-pay | Admitting: Obstetrics and Gynecology

## 2017-05-02 DIAGNOSIS — Z1231 Encounter for screening mammogram for malignant neoplasm of breast: Secondary | ICD-10-CM

## 2017-05-09 DIAGNOSIS — J069 Acute upper respiratory infection, unspecified: Secondary | ICD-10-CM | POA: Diagnosis not present

## 2017-05-09 DIAGNOSIS — J029 Acute pharyngitis, unspecified: Secondary | ICD-10-CM | POA: Diagnosis not present

## 2017-05-13 ENCOUNTER — Ambulatory Visit
Admission: RE | Admit: 2017-05-13 | Discharge: 2017-05-13 | Disposition: A | Discharge status: Home / Self Care | Payer: Medicare Other | Source: Ambulatory Visit | Attending: Obstetrics and Gynecology | Admitting: Obstetrics and Gynecology

## 2017-05-13 DIAGNOSIS — Z1231 Encounter for screening mammogram for malignant neoplasm of breast: Secondary | ICD-10-CM

## 2017-08-16 DIAGNOSIS — C801 Malignant (primary) neoplasm, unspecified: Secondary | ICD-10-CM

## 2017-08-16 HISTORY — DX: Malignant (primary) neoplasm, unspecified: C80.1

## 2017-08-22 DIAGNOSIS — N760 Acute vaginitis: Secondary | ICD-10-CM | POA: Diagnosis not present

## 2017-08-31 DIAGNOSIS — N898 Other specified noninflammatory disorders of vagina: Secondary | ICD-10-CM | POA: Diagnosis not present

## 2017-08-31 DIAGNOSIS — N952 Postmenopausal atrophic vaginitis: Secondary | ICD-10-CM | POA: Diagnosis not present

## 2017-09-01 DIAGNOSIS — L570 Actinic keratosis: Secondary | ICD-10-CM | POA: Diagnosis not present

## 2017-09-01 DIAGNOSIS — L821 Other seborrheic keratosis: Secondary | ICD-10-CM | POA: Diagnosis not present

## 2017-09-28 DIAGNOSIS — L570 Actinic keratosis: Secondary | ICD-10-CM | POA: Diagnosis not present

## 2017-09-30 DIAGNOSIS — H4010X0 Unspecified open-angle glaucoma, stage unspecified: Secondary | ICD-10-CM | POA: Diagnosis not present

## 2017-10-20 DIAGNOSIS — I10 Essential (primary) hypertension: Secondary | ICD-10-CM | POA: Diagnosis not present

## 2017-10-20 DIAGNOSIS — T7840XA Allergy, unspecified, initial encounter: Secondary | ICD-10-CM | POA: Diagnosis not present

## 2017-10-20 DIAGNOSIS — Z91040 Latex allergy status: Secondary | ICD-10-CM | POA: Insufficient documentation

## 2017-10-20 DIAGNOSIS — Z91010 Allergy to peanuts: Secondary | ICD-10-CM | POA: Insufficient documentation

## 2017-10-20 DIAGNOSIS — I2510 Atherosclerotic heart disease of native coronary artery without angina pectoris: Secondary | ICD-10-CM | POA: Diagnosis not present

## 2017-10-20 DIAGNOSIS — Z79899 Other long term (current) drug therapy: Secondary | ICD-10-CM | POA: Insufficient documentation

## 2017-10-20 DIAGNOSIS — R21 Rash and other nonspecific skin eruption: Secondary | ICD-10-CM | POA: Diagnosis not present

## 2017-10-20 DIAGNOSIS — L299 Pruritus, unspecified: Secondary | ICD-10-CM | POA: Diagnosis not present

## 2017-10-20 DIAGNOSIS — R1310 Dysphagia, unspecified: Secondary | ICD-10-CM | POA: Diagnosis not present

## 2017-10-20 NOTE — ED Triage Notes (Signed)
Pt to ED via [facility] with c/o allergic reaction.  Pt st's she is allergic to Latex and was wearing underwear with latex.  Pt st's she had a rash at 7pm tonight and took Benadryl.  Pt st's rash has subsided but continues to feel like she has a lump in her throat.

## 2017-10-21 ENCOUNTER — Encounter (HOSPITAL_COMMUNITY): Payer: Self-pay

## 2017-10-21 ENCOUNTER — Other Ambulatory Visit: Payer: Self-pay

## 2017-10-21 ENCOUNTER — Emergency Department (HOSPITAL_COMMUNITY)
Admission: EM | Admit: 2017-10-21 | Discharge: 2017-10-21 | Disposition: A | Discharge status: Home / Self Care | Payer: Medicare Other | Attending: Emergency Medicine | Admitting: Emergency Medicine

## 2017-10-21 ENCOUNTER — Emergency Department (HOSPITAL_COMMUNITY): Payer: Medicare Other

## 2017-10-21 DIAGNOSIS — T7840XA Allergy, unspecified, initial encounter: Secondary | ICD-10-CM

## 2017-10-21 DIAGNOSIS — R1310 Dysphagia, unspecified: Secondary | ICD-10-CM | POA: Diagnosis not present

## 2017-10-21 MED ORDER — PREDNISONE 20 MG PO TABS
20.0000 mg | ORAL_TABLET | Freq: Once | ORAL | Status: AC
  Administered 2017-10-21: 20 mg via ORAL
  Filled 2017-10-21: qty 1

## 2017-10-21 MED ORDER — PREDNISONE 10 MG PO TABS: 20.0000 mg | ORAL_TABLET | Freq: Two times a day (BID) | ORAL | 0 refills | Status: DC

## 2017-10-21 NOTE — ED Triage Notes (Signed)
This RN spoke with patient and patient stated that she was having an allergic reaction to latex.  Stated her throat was closing off.  Denies shortness of breath at this time but states she had a lump in her throat.  All since has resolved.  Lungs clear no wheezing

## 2017-10-21 NOTE — Discharge Instructions (Signed)
Prednisone as prescribed.  Benadryl 25 mg every 6 hours for the next 3 days.  Return to the emergency department for difficulty breathing, difficulty swallowing, or other new and concerning symptoms.

## 2017-10-21 NOTE — ED Provider Notes (Signed)
[facility] EMERGENCY DEPARTMENT Provider Note   CSN: [id number] Arrival date & time: 10/20/17  2313     History   Chief Complaint Chief Complaint  Patient presents with  . Allergic Reaction    HPI Karla Richards is a 72 y.o. female.  Patient is a 72 year old female with history of latex allergy.  She presents with rash that started on her waistline where her underwear elastic touches her skin.  She suspects that this is an allergy to latex.  She took Benadryl at home with some relief.  She does report some swelling of her throat initially, however this is gradually improving.  She denies any difficulty breathing.   The history is provided by the patient.  Allergic Reaction  Presenting symptoms: itching, rash and swelling   Presenting symptoms: no difficulty breathing and no difficulty swallowing   Severity:  Moderate Duration:  2 hours Prior episodes: latex. Relieved by:  Nothing Worsened by:  Nothing   Past Medical History:  Diagnosis Date  . Arthritis    hands, but reports that she is still active   . Complication of anesthesia    states 44 yrs. ago she was given "Seconal" for childbirth  & she was passed out & felt like she was on fire  . Coronary artery disease   . Genital herpes   . Glaucoma    bilateral   . Hearing difficulty    bilateral, Left worse than R , no aids yet   . Hypertension     Patient Active Problem List   Diagnosis Date Noted  . TIA (transient ischemic attack) 10/09/2014  . Dizziness, nonspecific 10/09/2014  . Dizziness   . Left leg numbness 03/10/2013  . Chest pain on exertion 03/10/2013  . HTN (hypertension) 03/10/2013    Past Surgical History:  Procedure Laterality Date  . CESAREAN SECTION  1970, '72, '79  . coronary catherization    . TRABECULECTOMY Left 03/26/2015   Procedure: TRABECULECTOMY WITH [facility] ON THE LEFT EYE;  Surgeon: Marylynn Pearson, MD;  Location: [facility];  Service: Ophthalmology;  Laterality:  Left;  . TUBAL LIGATION      OB History    No data available       Home Medications    Prior to Admission medications   Medication Sig Start Date End Date Taking? Authorizing Provider  latanoprost (XALATAN) 0.005 % ophthalmic solution Place 1 drop into the right eye at bedtime.    [provider]  predniSONE (DELTASONE) 50 MG tablet 1 tablet PO daily 01/30/17   Rancour, Annie Main, MD  triamterene-hydrochlorothiazide (MAXZIDE) 75-50 MG tablet Take 1 tablet by mouth daily.    [provider]    Family History Family History  Problem Relation Age of Onset  . Stroke Maternal Grandmother 50  . Heart attack Mother 99    Social History Social History   Tobacco Use  . Smoking status: Never Smoker  . Smokeless tobacco: Never Used  Substance Use Topics  . Alcohol use: No  . Drug use: No     Allergies   Fish allergy; Mango flavor; Other; Peanuts [peanut oil]; Shellfish allergy; Seconal [secobarbital sodium]; Ace inhibitors; Alcohol-sulfur [sulfur]; Aspirin; Beta adrenergic blockers; Brimonidine; Caffeine; Iodine solution [povidone iodine]; Latex; Milk-related compounds; Motrin [ibuprofen]; Norvasc [amlodipine]; Penicillins; and Sulfa antibiotics   Review of Systems Review of Systems  HENT: Negative for trouble swallowing.   Skin: Positive for itching and rash.  All other systems reviewed and are negative.  Physical Exam Updated Vital Signs BP (!) 190/86 (BP Location: Left Arm)   Pulse 69   Temp 97.7 F (36.5 C) (Oral)   Resp 16   Ht 5' (1.524 m)   Wt 49.9 kg (110 lb)   SpO2 100%   BMI 21.48 kg/m   Physical Exam  Constitutional: She is oriented to person, place, and time. She appears well-developed and well-nourished. No distress.  HENT:  Head: Normocephalic and atraumatic.  Neck: Normal range of motion. Neck supple.  Cardiovascular: Normal rate and regular rhythm. Exam reveals no gallop and no friction rub.  No murmur heard. Pulmonary/Chest:  Effort normal and breath sounds normal. No respiratory distress. She has no wheezes.  Abdominal: Soft. Bowel sounds are normal. She exhibits no distension. There is no tenderness.  Musculoskeletal: Normal range of motion.  Neurological: She is alert and oriented to person, place, and time.  Skin: Skin is warm and dry. She is not diaphoretic.  Nursing note and vitals reviewed.    ED Treatments / Results  Labs (all labs ordered are listed, but only abnormal results are displayed) Labs Reviewed - No data to display  EKG  EKG Interpretation None       Radiology Dg Chest 2 View  Result Date: 10/21/2017 CLINICAL DATA:  Allergic reaction with difficulty swallowing. EXAM: CHEST - 2 VIEW COMPARISON:  Radiographs 06/23/2016 FINDINGS: The cardiomediastinal contours are normal. The lungs are clear. Pulmonary vasculature is normal. No consolidation, pleural effusion, or pneumothorax. No acute osseous abnormalities are seen. IMPRESSION: No acute abnormality. Electronically Signed   By: Jeb Levering M.D.   On: 10/21/2017 01:32    Procedures Procedures (including critical care time)  Medications Ordered in ED Medications  predniSONE (DELTASONE) tablet 20 mg (not administered)     Initial Impression / Assessment and Plan / ED Course  I have reviewed the triage vital signs and the nursing notes.  Pertinent labs & imaging results that were available during my care of the patient were reviewed by me and considered in my medical decision making (see chart for details).  Patient presenting with rash and throat irritation due to what she believes is a latex allergy from the elastic waistband in her underpants.  I see no rash and no evidence for stridor or airway swelling.  She has taken Benadryl and seems to be improving.  She continues to feel a lump in her throat.  She will be given prednisone, continued Benadryl, and follow-up as needed.  Final Clinical Impressions(s) / ED Diagnoses    Final diagnoses:  None    ED Discharge Orders    None       Veryl Speak, MD 10/21/17 [phone]

## 2017-11-21 ENCOUNTER — Other Ambulatory Visit: Payer: Self-pay

## 2017-11-21 ENCOUNTER — Ambulatory Visit (HOSPITAL_COMMUNITY)
Admission: EM | Admit: 2017-11-21 | Discharge: 2017-11-21 | Disposition: A | Discharge status: Home / Self Care | Payer: Medicare Other | Attending: Family Medicine | Admitting: Family Medicine

## 2017-11-21 ENCOUNTER — Encounter (HOSPITAL_COMMUNITY): Payer: Self-pay | Admitting: Urgent Care

## 2017-11-21 DIAGNOSIS — Z760 Encounter for issue of repeat prescription: Secondary | ICD-10-CM | POA: Diagnosis not present

## 2017-11-21 DIAGNOSIS — I10 Essential (primary) hypertension: Secondary | ICD-10-CM | POA: Diagnosis not present

## 2017-11-21 LAB — POCT I-STAT, CHEM 8
BUN: 10 mg/dL (ref 6–20)
CREATININE: 1.2 mg/dL — AB (ref 0.44–1.00)
Calcium, Ion: 1.28 mmol/L (ref 1.15–1.40)
Chloride: 102 mmol/L (ref 101–111)
GLUCOSE: 98 mg/dL (ref 65–99)
HEMATOCRIT: 35 % — AB (ref 36.0–46.0)
HEMOGLOBIN: 11.9 g/dL — AB (ref 12.0–15.0)
Potassium: 3.7 mmol/L (ref 3.5–5.1)
Sodium: 141 mmol/L (ref 135–145)
TCO2: 30 mmol/L (ref 22–32)

## 2017-11-21 MED ORDER — TRIAMTERENE-HCTZ 75-50 MG PO TABS: 1.0000 | ORAL_TABLET | Freq: Every day | ORAL | 0 refills | Status: DC

## 2017-11-21 NOTE — ED Triage Notes (Addendum)
Out of triam/hctz 75-50mg  QD.  Last dose 3 days ago.  Patient requesting eye drops and cream for face  pcp had license suspended.    Patient is concerned about potassium level.  Patient has intermittent palpitations.  Denies pain

## 2017-11-21 NOTE — ED Provider Notes (Signed)
[facility]    CSN: [id number] Arrival date & time: 11/21/17  1751     History   Chief Complaint Chief Complaint  Patient presents with  . Medication Refill    HPI Karla Richards is a 72 y.o. female.   Karla Richards presents with concerns of out of her bp medication for the past three days. States she had a mild headache today which is how she remembered she ran out. Her doctor is currently without license. Without chest pain or shortness of breath. Headache has improved.   Also complaints of area to left eye of redness which she is worried about. She states she was found to have precancer to her nose and was treated. She is worried about pre cancer to her nose. Has seen [facility] for this in February. Has a follow up appointment in May. Denies pain, vision change. Wears glasses and uses eye drops at baseline.   See medical history.   ROS per HPI.      Past Medical History:  Diagnosis Date  . Arthritis    hands, but reports that she is still active   . Complication of anesthesia    states 46 yrs. ago she was given "Seconal" for childbirth  & she was passed out & felt like she was on fire  . Coronary artery disease   . Genital herpes   . Glaucoma    bilateral   . Hearing difficulty    bilateral, Left worse than R , no aids yet   . Hypertension     Patient Active Problem List   Diagnosis Date Noted  . TIA (transient ischemic attack) 10/09/2014  . Dizziness, nonspecific 10/09/2014  . Dizziness   . Left leg numbness 03/10/2013  . Chest pain on exertion 03/10/2013  . HTN (hypertension) 03/10/2013    Past Surgical History:  Procedure Laterality Date  . CESAREAN SECTION  1970, '72, '79  . coronary catherization    . TRABECULECTOMY Left 03/26/2015   Procedure: TRABECULECTOMY WITH [facility] ON THE LEFT EYE;  Surgeon: Marylynn Pearson, MD;  Location: [facility];  Service: Ophthalmology;  Laterality: Left;  . TUBAL LIGATION      OB History   None       Home Medications    Prior to Admission medications   Medication Sig Start Date End Date Taking? Authorizing Provider  Tafluprost (ZIOPTAN OP) Apply to eye.   Yes [provider]  latanoprost (XALATAN) 0.005 % ophthalmic solution Place 1 drop into the right eye at bedtime.    [provider]  predniSONE (DELTASONE) 10 MG tablet Take 2 tablets (20 mg total) by mouth 2 (two) times daily with a meal. 10/21/17   Delo, Nathaneil Canary, MD  triamterene-hydrochlorothiazide (MAXZIDE) 75-50 MG tablet Take 1 tablet by mouth daily. 11/21/17   Zigmund Gottron, NP    Family History Family History  Problem Relation Age of Onset  . Stroke Maternal Grandmother 50  . Heart attack Mother 23    Social History Social History   Tobacco Use  . Smoking status: Never Smoker  . Smokeless tobacco: Never Used  Substance Use Topics  . Alcohol use: No  . Drug use: No     Allergies   Fish allergy; Mango flavor; Other; Peanuts [peanut oil]; Shellfish allergy; Seconal [secobarbital sodium]; Ace inhibitors; Alcohol-sulfur [sulfur]; Aspirin; Beta adrenergic blockers; Brimonidine; Caffeine; Iodine solution [povidone iodine]; Latex; Milk-related compounds; Motrin [ibuprofen]; Norvasc [amlodipine]; Penicillins; and Sulfa antibiotics   Review of Systems Review  of Systems   Physical Exam Triage Vital Signs ED Triage Vitals  Enc Vitals Group     BP 11/21/17 1824 (!) 147/68     Pulse Rate 11/21/17 1824 86     Resp 11/21/17 1824 18     Temp 11/21/17 1824 98.2 F (36.8 C)     Temp Source 11/21/17 1824 Oral     SpO2 11/21/17 1824 100 %     Weight --      Height --      Head Circumference --      Peak Flow --      Pain Score 11/21/17 1821 0     Pain Loc --      Pain Edu? --      Excl. in [place]? --    No data found.  Updated Vital Signs BP (!) 147/68 (BP Location: Right Arm)   Pulse 86   Temp 98.2 F (36.8 C) (Oral)   Resp 18   SpO2 100%   Visual Acuity Right Eye Distance:   Left  Eye Distance:   Bilateral Distance:    Right Eye Near:   Left Eye Near:    Bilateral Near:     Physical Exam  Constitutional: She is oriented to person, place, and time. She appears well-developed and well-nourished. No distress.  Eyes: Pupils are equal, round, and reactive to light. Conjunctivae and EOM are normal. Right eye exhibits no discharge. Left eye exhibits no discharge.  No visible redness or swelling to left eye soft tissue or conjunctiva   Cardiovascular: Normal rate, regular rhythm and normal heart sounds.  Pulmonary/Chest: Effort normal and breath sounds normal.  Neurological: She is alert and oriented to person, place, and time.  Skin: Skin is warm and dry.     UC Treatments / Results  Labs (all labs ordered are listed, but only abnormal results are displayed) Labs Reviewed  POCT I-STAT, CHEM 8 - Abnormal; Notable for the following components:      Result Value   Creatinine, Ser 1.20 (*)    Hemoglobin 11.9 (*)    HCT 35.0 (*)    All other components within normal limits    EKG None Radiology No results found.  Procedures Procedures (including critical care time)  Medications Ordered in UC Medications - No data to display   Initial Impression / Assessment and Plan / UC Course  I have reviewed the triage vital signs and the nursing notes.  Pertinent labs & imaging results that were available during my care of the patient were reviewed by me and considered in my medical decision making (see chart for details).     Blood pressure medication refilled. Encouraged establish with PCP for management and monitor of creatinine. Discussed with patient to follow with ophthalmology for further eye evaluation, patient states she may see a local eye doctor for a second opinion. Return precautions provided. Patient verbalized understanding and agreeable to plan.    Final Clinical Impressions(s) / UC Diagnoses   Final diagnoses:  Encounter for medication refill   Essential hypertension    ED Discharge Orders        Ordered    triamterene-hydrochlorothiazide (MAXZIDE) 75-50 MG tablet  Daily     11/21/17 1850       Controlled Substance Prescriptions [place] Controlled Substance Registry consulted? Not Applicable   Zigmund Gottron, NP 11/21/17 [phone]

## 2017-11-27 ENCOUNTER — Other Ambulatory Visit: Payer: Self-pay

## 2017-11-27 ENCOUNTER — Encounter (HOSPITAL_COMMUNITY): Payer: Self-pay | Admitting: Emergency Medicine

## 2017-11-27 ENCOUNTER — Emergency Department (HOSPITAL_COMMUNITY)
Admission: EM | Admit: 2017-11-27 | Discharge: 2017-11-28 | Disposition: A | Discharge status: Home / Self Care | Payer: Medicare Other | Attending: Emergency Medicine | Admitting: Emergency Medicine

## 2017-11-27 DIAGNOSIS — I2510 Atherosclerotic heart disease of native coronary artery without angina pectoris: Secondary | ICD-10-CM | POA: Diagnosis not present

## 2017-11-27 DIAGNOSIS — R109 Unspecified abdominal pain: Secondary | ICD-10-CM | POA: Diagnosis not present

## 2017-11-27 DIAGNOSIS — Z79899 Other long term (current) drug therapy: Secondary | ICD-10-CM | POA: Diagnosis not present

## 2017-11-27 DIAGNOSIS — I10 Essential (primary) hypertension: Secondary | ICD-10-CM | POA: Diagnosis not present

## 2017-11-27 DIAGNOSIS — R197 Diarrhea, unspecified: Secondary | ICD-10-CM | POA: Diagnosis not present

## 2017-11-27 DIAGNOSIS — R110 Nausea: Secondary | ICD-10-CM | POA: Diagnosis not present

## 2017-11-27 LAB — CBC
HEMATOCRIT: 36.6 % (ref 36.0–46.0)
Hemoglobin: 11.8 g/dL — ABNORMAL LOW (ref 12.0–15.0)
MCH: 29.7 pg (ref 26.0–34.0)
MCHC: 32.2 g/dL (ref 30.0–36.0)
MCV: 92.2 fL (ref 78.0–100.0)
PLATELETS: 239 10*3/uL (ref 150–400)
RBC: 3.97 MIL/uL (ref 3.87–5.11)
RDW: 14.2 % (ref 11.5–15.5)
WBC: 8.3 10*3/uL (ref 4.0–10.5)

## 2017-11-27 LAB — COMPREHENSIVE METABOLIC PANEL
ALT: 16 U/L (ref 14–54)
AST: 27 U/L (ref 15–41)
Albumin: 4 g/dL (ref 3.5–5.0)
Alkaline Phosphatase: 66 U/L (ref 38–126)
Anion gap: 9 (ref 5–15)
BILIRUBIN TOTAL: 0.3 mg/dL (ref 0.3–1.2)
BUN: 15 mg/dL (ref 6–20)
CHLORIDE: 103 mmol/L (ref 101–111)
CO2: 26 mmol/L (ref 22–32)
CREATININE: 1.28 mg/dL — AB (ref 0.44–1.00)
Calcium: 9.6 mg/dL (ref 8.9–10.3)
GFR, EST AFRICAN AMERICAN: 47 mL/min — AB (ref >60)
GFR, EST NON AFRICAN AMERICAN: 40 mL/min — AB (ref >60)
Glucose, Bld: 116 mg/dL — ABNORMAL HIGH (ref 65–99)
POTASSIUM: 3.4 mmol/L — AB (ref 3.5–5.1)
Sodium: 138 mmol/L (ref 135–145)
TOTAL PROTEIN: 7 g/dL (ref 6.5–8.1)

## 2017-11-27 LAB — LIPASE, BLOOD: LIPASE: 47 U/L (ref 11–51)

## 2017-11-27 NOTE — ED Notes (Signed)
ED Provider at bedside. 

## 2017-11-27 NOTE — ED Provider Notes (Signed)
TIME SEEN: 11:30 PM  CHIEF COMPLAINT: Abdominal pain  HPI: Patient is a 72 year old female with history of coronary artery disease, hypertension who presents to the emergency department with abdominal cramping and diarrhea that started this afternoon.  Has had nausea but no vomiting.  No dysuria or hematuria.  No vaginal bleeding or discharge.  Has had previous C-sections but no other other abdominal surgery.  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  No recent travel.  Reports pain has improved after bowel movements.  ROS: See HPI Constitutional: no fever  Eyes: no drainage  ENT: no runny nose   Cardiovascular:  no chest pain  Resp: no SOB  GI: no vomiting GU: no dysuria Integumentary: no rash  Allergy: no hives  Musculoskeletal: no leg swelling  Neurological: no slurred speech ROS otherwise negative  PAST MEDICAL HISTORY/PAST SURGICAL HISTORY:  Past Medical History:  Diagnosis Date  . Arthritis    hands, but reports that she is still active   . Complication of anesthesia    states 44 yrs. ago she was given "Seconal" for childbirth  & she was passed out & felt like she was on fire  . Coronary artery disease   . Genital herpes   . Glaucoma    bilateral   . Hearing difficulty    bilateral, Left worse than R , no aids yet   . Hypertension     MEDICATIONS:  Prior to Admission medications   Medication Sig Start Date End Date Taking? Authorizing Provider  latanoprost (XALATAN) 0.005 % ophthalmic solution Place 1 drop into the right eye at bedtime.    [provider]  predniSONE (DELTASONE) 10 MG tablet Take 2 tablets (20 mg total) by mouth 2 (two) times daily with a meal. 10/21/17   Delo, Nathaneil Canary, MD  Tafluprost (ZIOPTAN OP) Apply to eye.    [provider]  triamterene-hydrochlorothiazide (MAXZIDE) 75-50 MG tablet Take 1 tablet by mouth daily. 11/21/17   Zigmund Gottron, NP    ALLERGIES:  Allergies  Allergen Reactions  . Fish Allergy  Anaphylaxis  . Mango Flavor Anaphylaxis  . Other Anaphylaxis    Tree nuts  . Peanuts [Peanut Oil] Anaphylaxis  . Shellfish Allergy Swelling  . Seconal [Secobarbital Sodium] Other (See Comments)    Pt. felt like she was on fire, states her Mom told she passed out  . Ace Inhibitors Hives  . Alcohol-Sulfur [Sulfur] Other (See Comments)    Sleepy  . Aspirin Other (See Comments)    Abdominal irritation  . Beta Adrenergic Blockers Hives  . Brimonidine Itching and Other (See Comments)    Puffy eyes - [place]   . Caffeine Other (See Comments)    Jittery, feels nervous  . Iodine Solution [Povidone Iodine] Itching and Other (See Comments)    Topical solution caused severe itching  . Latex Itching  . Milk-Related Compounds Other (See Comments)    Fluid in the ear and dizziness   . Motrin [Ibuprofen] Other (See Comments)    Upset stomach  . Norvasc [Amlodipine] Hives and Swelling  . Penicillins Itching  . Sulfa Antibiotics Other (See Comments)    Can take with Benadryl    SOCIAL HISTORY:  Social History   Tobacco Use  . Smoking status: Never Smoker  . Smokeless tobacco: Never Used  Substance Use Topics  . Alcohol use: No    FAMILY HISTORY: Family History  Problem Relation Age of Onset  . Stroke Maternal Grandmother 50  .  Heart attack Mother 35    EXAM: BP (!) 148/82 (BP Location: Right Arm)   Pulse 73   Temp 98.6 F (37 C) (Oral)   Resp 18   Ht 5' (1.524 m)   Wt 49.9 kg (110 lb)   SpO2 100%   BMI 21.48 kg/m  CONSTITUTIONAL: Alert and oriented and responds appropriately to questions. Well-appearing; well-nourished HEAD: Normocephalic EYES: Conjunctivae clear, pupils appear equal, EOMI ENT: normal nose; moist mucous membranes NECK: Supple, no meningismus, no nuchal rigidity, no LAD  CARD: RRR; S1 and S2 appreciated; no murmurs, no clicks, no rubs, no gallops RESP: Normal chest excursion without splinting or tachypnea; breath sounds clear and equal bilaterally; no  wheezes, no rhonchi, no rales, no hypoxia or respiratory distress, speaking full sentences ABD/GI: Normal bowel sounds; non-distended; soft, non-tender, no rebound, no guarding, no peritoneal signs, no hepatosplenomegaly BACK:  The back appears normal and is non-tender to palpation, there is no CVA tenderness EXT: Normal ROM in all joints; non-tender to palpation; no edema; normal capillary refill; no cyanosis, no calf tenderness or swelling    SKIN: Normal color for age and race; warm; no rash NEURO: Moves all extremities equally PSYCH: The patient's mood and manner are appropriate. Grooming and personal hygiene are appropriate.  MEDICAL DECISION MAKING: Patient here with abdominal cramping, diarrhea.  Symptoms have now completely resolved and she denies wanting any medication at this time.  Abdominal exam is benign.  Labs, urine pending.  Suspect viral illness causing symptoms.  Will reassess.  ED PROGRESS: Patient's labs, urine unremarkable.  Creatinine mildly elevated but this appears similar to her most recent creatinine.  She is drinking fluids without difficulty.  Repeat abdominal exam still benign.  She states she is feeling well and feels comfortable with plan for discharge home.  Will discharge with prescriptions for Bentyl, Zofran, Imodium for symptomatic relief if symptoms return.  Recommend increase fluid intake and bland diet.  Discussed return precautions.  Doubt appendicitis, colitis, diverticulitis, bowel obstruction based on her benign exam.   At this time, I do not feel there is any life-threatening condition present. I have reviewed and discussed all results (EKG, imaging, lab, urine as appropriate) and exam findings with patient/family. I have reviewed nursing notes and appropriate previous records.  I feel the patient is safe to be discharged home without further emergent workup and can continue workup as an outpatient as needed. Discussed usual and customary return precautions.  Patient/family verbalize understanding and are comfortable with this plan.  Outpatient follow-up has been provided if needed. All questions have been answered.      Joua Bake, Delice Bison, DO 11/28/17 [phone]

## 2017-11-27 NOTE — ED Triage Notes (Signed)
Pt BIB EMS from home for abdominal pain and weakness. Pt reports having an active day getting ready to go out of town tomorrow. After dinner pt states she had lower abdominal pain following a BM. States after the "normal BM some liquid came out." Pt denies n/v. Abd pain now 1/10.

## 2017-11-28 DIAGNOSIS — R109 Unspecified abdominal pain: Secondary | ICD-10-CM | POA: Diagnosis not present

## 2017-11-28 LAB — URINALYSIS, ROUTINE W REFLEX MICROSCOPIC
Bilirubin Urine: NEGATIVE
Glucose, UA: NEGATIVE mg/dL
Hgb urine dipstick: NEGATIVE
Ketones, ur: NEGATIVE mg/dL
LEUKOCYTES UA: NEGATIVE
NITRITE: NEGATIVE
PH: 7 (ref 5.0–8.0)
Protein, ur: NEGATIVE mg/dL
SPECIFIC GRAVITY, URINE: 1.008 (ref 1.005–1.030)

## 2017-11-28 MED ORDER — DICYCLOMINE HCL 20 MG PO TABS: 20.0000 mg | ORAL_TABLET | Freq: Three times a day (TID) | ORAL | 0 refills | Status: DC | PRN

## 2017-11-28 MED ORDER — LOPERAMIDE HCL 2 MG PO CAPS: 2.0000 mg | ORAL_CAPSULE | Freq: Four times a day (QID) | ORAL | 0 refills | Status: DC | PRN

## 2017-11-28 MED ORDER — ONDANSETRON 4 MG PO TBDP: 4.0000 mg | ORAL_TABLET | Freq: Four times a day (QID) | ORAL | 0 refills | Status: DC | PRN

## 2017-11-28 NOTE — Discharge Instructions (Signed)
To find a primary care or specialty doctor please call [phone] or [phone] to access "[facility] Find a Doctor Service." ° °You may also go on the [facility] website at [email or web address] ° °There are also multiple [facility], [facility], [facility] and [facility] practices throughout the [place] that are frequently accepting new patients. You may find a clinic that is close to your home and contact them. ° °[facility] -  °[address] °[place] [address] °[phone] ° ° °[facility] -  °[address] °[place] [address] [phone] ° ° °[facility] - °[address]  °[place] [address] °[phone] ° ° °

## 2017-12-28 DIAGNOSIS — L570 Actinic keratosis: Secondary | ICD-10-CM | POA: Diagnosis not present

## 2017-12-30 DIAGNOSIS — H401133 Primary open-angle glaucoma, bilateral, severe stage: Secondary | ICD-10-CM | POA: Diagnosis not present

## 2018-01-17 DIAGNOSIS — I10 Essential (primary) hypertension: Secondary | ICD-10-CM | POA: Diagnosis not present

## 2018-01-17 DIAGNOSIS — Z8679 Personal history of other diseases of the circulatory system: Secondary | ICD-10-CM | POA: Diagnosis not present

## 2018-02-20 ENCOUNTER — Other Ambulatory Visit: Payer: Self-pay

## 2018-02-20 ENCOUNTER — Encounter (HOSPITAL_COMMUNITY): Payer: Self-pay | Admitting: *Deleted

## 2018-02-20 ENCOUNTER — Ambulatory Visit (HOSPITAL_COMMUNITY)
Admission: EM | Admit: 2018-02-20 | Discharge: 2018-02-20 | Disposition: A | Discharge status: Home / Self Care | Payer: Medicare Other | Attending: Family Medicine | Admitting: Family Medicine

## 2018-02-20 DIAGNOSIS — I10 Essential (primary) hypertension: Secondary | ICD-10-CM | POA: Diagnosis not present

## 2018-02-20 DIAGNOSIS — Z760 Encounter for issue of repeat prescription: Secondary | ICD-10-CM

## 2018-02-20 MED ORDER — TRIAMTERENE-HCTZ 37.5-25 MG PO TABS: 1.0000 | ORAL_TABLET | Freq: Every day | ORAL | 1 refills | Status: DC

## 2018-02-20 NOTE — ED Triage Notes (Signed)
States she needs a refill on her blood pressure medication last dose 2 days ago
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
## 2018-02-20 NOTE — Discharge Instructions (Addendum)
It was nice meeting you!!  I have refilled your Blood pressure medication  today.  Please follow up with PCP regarding future refills.

## 2018-03-24 DIAGNOSIS — F064 Anxiety disorder due to known physiological condition: Secondary | ICD-10-CM | POA: Diagnosis not present

## 2018-03-24 DIAGNOSIS — Z6823 Body mass index (BMI) 23.0-23.9, adult: Secondary | ICD-10-CM | POA: Diagnosis not present

## 2018-03-24 DIAGNOSIS — Z881 Allergy status to other antibiotic agents status: Secondary | ICD-10-CM | POA: Diagnosis not present

## 2018-03-24 DIAGNOSIS — Z882 Allergy status to sulfonamides status: Secondary | ICD-10-CM | POA: Diagnosis not present

## 2018-03-24 DIAGNOSIS — Z880 Allergy status to penicillin: Secondary | ICD-10-CM | POA: Diagnosis not present

## 2018-03-24 DIAGNOSIS — I10 Essential (primary) hypertension: Secondary | ICD-10-CM | POA: Diagnosis not present

## 2018-03-24 DIAGNOSIS — Z889 Allergy status to unspecified drugs, medicaments and biological substances status: Secondary | ICD-10-CM | POA: Diagnosis not present

## 2018-03-31 DIAGNOSIS — H401133 Primary open-angle glaucoma, bilateral, severe stage: Secondary | ICD-10-CM | POA: Diagnosis not present

## 2018-04-20 DIAGNOSIS — L821 Other seborrheic keratosis: Secondary | ICD-10-CM | POA: Diagnosis not present

## 2018-04-20 DIAGNOSIS — L570 Actinic keratosis: Secondary | ICD-10-CM | POA: Diagnosis not present

## 2018-04-20 DIAGNOSIS — D229 Melanocytic nevi, unspecified: Secondary | ICD-10-CM | POA: Diagnosis not present

## 2018-08-04 DIAGNOSIS — I10 Essential (primary) hypertension: Secondary | ICD-10-CM | POA: Diagnosis not present

## 2019-04-10 ENCOUNTER — Telehealth: Payer: Self-pay | Admitting: Gastroenterology

## 2019-04-10 ENCOUNTER — Ambulatory Visit (INDEPENDENT_AMBULATORY_CARE_PROVIDER_SITE_OTHER): Payer: Medicare Other | Admitting: Gastroenterology

## 2019-04-10 ENCOUNTER — Other Ambulatory Visit (INDEPENDENT_AMBULATORY_CARE_PROVIDER_SITE_OTHER): Payer: Medicare Other

## 2019-04-10 ENCOUNTER — Encounter: Payer: Self-pay | Admitting: Gastroenterology

## 2019-04-10 VITALS — BP 120/70 | HR 77 | Temp 98.1°F | Ht 59.0 in | Wt 116.0 lb

## 2019-04-10 DIAGNOSIS — Z1211 Encounter for screening for malignant neoplasm of colon: Secondary | ICD-10-CM | POA: Diagnosis not present

## 2019-04-10 DIAGNOSIS — R195 Other fecal abnormalities: Secondary | ICD-10-CM | POA: Diagnosis not present

## 2019-04-10 DIAGNOSIS — R1013 Epigastric pain: Secondary | ICD-10-CM

## 2019-04-10 LAB — CBC WITH DIFFERENTIAL/PLATELET
Basophils Absolute: 0.1 10*3/uL (ref 0.0–0.1)
Basophils Relative: 1.1 % (ref 0.0–3.0)
Eosinophils Absolute: 0.1 10*3/uL (ref 0.0–0.7)
Eosinophils Relative: 1.8 % (ref 0.0–5.0)
HCT: 35.8 % — ABNORMAL LOW (ref 36.0–46.0)
Hemoglobin: 11.8 g/dL — ABNORMAL LOW (ref 12.0–15.0)
Lymphocytes Relative: 30.6 % (ref 12.0–46.0)
Lymphs Abs: 1.6 10*3/uL (ref 0.7–4.0)
MCHC: 32.9 g/dL (ref 30.0–36.0)
MCV: 90.2 fl (ref 78.0–100.0)
Monocytes Absolute: 0.5 10*3/uL (ref 0.1–1.0)
Monocytes Relative: 9.7 % (ref 3.0–12.0)
Neutro Abs: 2.9 10*3/uL (ref 1.4–7.7)
Neutrophils Relative %: 56.8 % (ref 43.0–77.0)
Platelets: 216 10*3/uL (ref 150.0–400.0)
RBC: 3.97 Mil/uL (ref 3.87–5.11)
RDW: 13.7 % (ref 11.5–15.5)
WBC: 5.1 10*3/uL (ref 4.0–10.5)

## 2019-04-10 MED ORDER — NA SULFATE-K SULFATE-MG SULF 17.5-3.13-1.6 GM/177ML PO SOLN: ORAL | 0 refills | Status: DC

## 2019-04-10 NOTE — Patient Instructions (Signed)
You have been scheduled for an endoscopy and colonoscopy. Please follow the written instructions given to you at your visit today. Please pick up your prep supplies at the pharmacy within the next 1-3 days. If you use inhalers (even only as needed), please bring them with you on the day of your procedure.  Go to the basement for labs today   I appreciate the  opportunity to care for you  Thank You   Harl Bowie , MD
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
## 2019-04-10 NOTE — Telephone Encounter (Signed)
Okay; thanks.

## 2019-04-12 ENCOUNTER — Encounter: Payer: Self-pay | Admitting: Gastroenterology

## 2019-04-13 NOTE — Telephone Encounter (Signed)

## 2019-04-16 ENCOUNTER — Other Ambulatory Visit: Payer: Self-pay

## 2019-04-16 ENCOUNTER — Ambulatory Visit (AMBULATORY_SURGERY_CENTER): Payer: Medicare Other | Admitting: Gastroenterology

## 2019-04-16 ENCOUNTER — Encounter: Payer: Self-pay | Admitting: Gastroenterology

## 2019-04-16 VITALS — BP 173/86 | HR 60 | Temp 98.5°F | Resp 10 | Ht 59.0 in | Wt 116.0 lb

## 2019-04-16 DIAGNOSIS — K635 Polyp of colon: Secondary | ICD-10-CM

## 2019-04-16 DIAGNOSIS — K3189 Other diseases of stomach and duodenum: Secondary | ICD-10-CM

## 2019-04-16 DIAGNOSIS — K2970 Gastritis, unspecified, without bleeding: Secondary | ICD-10-CM

## 2019-04-16 DIAGNOSIS — K2901 Acute gastritis with bleeding: Secondary | ICD-10-CM

## 2019-04-16 DIAGNOSIS — Z1211 Encounter for screening for malignant neoplasm of colon: Secondary | ICD-10-CM

## 2019-04-16 DIAGNOSIS — R195 Other fecal abnormalities: Secondary | ICD-10-CM

## 2019-04-16 DIAGNOSIS — D120 Benign neoplasm of cecum: Secondary | ICD-10-CM

## 2019-04-16 MED ORDER — OMEPRAZOLE 40 MG PO CPDR: 40.0000 mg | DELAYED_RELEASE_CAPSULE | Freq: Every day | ORAL | 0 refills | Status: DC

## 2019-04-16 MED ORDER — SODIUM CHLORIDE 0.9 % IV SOLN: 500.0000 mL | Freq: Once | INTRAVENOUS | Status: DC

## 2019-04-16 NOTE — Progress Notes (Signed)
Called to room to assist during endoscopic procedure.  Patient ID and intended procedure confirmed with present staff. Received instructions for my participation in the procedure from the performing physician.  

## 2019-04-16 NOTE — Op Note (Signed)
[place] [facility] Patient Name: Karla Richards Procedure Date: 04/16/2019 10:16 AM MRN: [id number] Endoscopist: Mauri Pole , MD Age: 73 Referring MD:  Date of Birth: May 16, 1946 Gender: Female Account #: [id number] Procedure:                Upper GI endoscopy Indications:              Suspected upper gastrointestinal bleeding, ,                            Suspected upper gastrointestinal bleeding in                            patient with unexplained iron deficiency anemiaDark                            stool, melena, Medicines:                Monitored Anesthesia Care Procedure:                Pre-Anesthesia Assessment:                           - Prior to the procedure, a History and Physical                            was performed, and patient medications and                            allergies were reviewed. The patient's tolerance of                            previous anesthesia was also reviewed. The risks                            and benefits of the procedure and the sedation                            options and risks were discussed with the patient.                            All questions were answered, and informed consent                            was obtained. Prior Anticoagulants: The patient has                            taken no previous anticoagulant or antiplatelet                            agents. ASA Grade Assessment: II - A patient with                            mild systemic disease. After reviewing the risks  and benefits, the patient was deemed in                            satisfactory condition to undergo the procedure.                           After obtaining informed consent, the endoscope was                            passed under direct vision. Throughout the                            procedure, the patient's blood pressure, pulse, and                            oxygen saturations were monitored  continuously. The                            Endoscope was introduced through the mouth, and                            advanced to the second part of duodenum. The upper                            GI endoscopy was accomplished without difficulty.                            The patient tolerated the procedure well. Scope In: Scope Out: Findings:                 The esophagus was normal.                           Patchy moderate inflammation with hemorrhage                            characterized by adherent blood, congestion                            (edema), erosions and erythema was found in the                            entire examined stomach. Biopsies were taken with a                            cold forceps for Helicobacter pylori testing.                           The examined duodenum was normal. Complications:            No immediate complications. Estimated Blood Loss:     Estimated blood loss was minimal. Impression:               - Normal esophagus.                           -  Gastritis with hemorrhage. Biopsied.                           - Normal examined duodenum. Recommendation:           - Patient has a contact number available for                            emergencies. The signs and symptoms of potential                            delayed complications were discussed with the                            patient. Return to normal activities tomorrow.                            Written discharge instructions were provided to the                            patient.                           - Resume previous diet.                           - Continue present medications.                           - Await pathology results.                           - No aspirin, ibuprofen, naproxen, or other                            non-steroidal anti-inflammatory drugs.                           - Use Prilosec (omeprazole) 40 mg PO daily for 3                            months.                            - Return to GI office in 2 months. Mauri Pole, MD 04/16/2019 10:53:47 AM This report has been signed electronically.

## 2019-04-16 NOTE — Op Note (Signed)
[place] Patient Name: Kaslyn Hennessey Procedure Date: 04/16/2019 10:15 AM MRN: [id number] Endoscopist: Mauri Pole , MD Age: 73 Referring MD:  Date of Birth: 1946/02/05 Gender: Female Account #: [id number] Procedure:                Colonoscopy Indications:              Screening for colorectal malignant neoplasm Medicines:                Monitored Anesthesia Care Procedure:                Pre-Anesthesia Assessment:                           - Prior to the procedure, a History and Physical                            was performed, and patient medications and                            allergies were reviewed. The patient's tolerance of                            previous anesthesia was also reviewed. The risks                            and benefits of the procedure and the sedation                            options and risks were discussed with the patient.                            All questions were answered, and informed consent                            was obtained. Prior Anticoagulants: The patient has                            taken no previous anticoagulant or antiplatelet                            agents. ASA Grade Assessment: II - A patient with                            mild systemic disease. After reviewing the risks                            and benefits, the patient was deemed in                            satisfactory condition to undergo the procedure.                           After obtaining informed consent, the colonoscope  was passed under direct vision. Throughout the                            procedure, the patient's blood pressure, pulse, and                            oxygen saturations were monitored continuously. The                            Colonoscope was introduced through the anus and                            advanced to the the cecum, identified by   appendiceal orifice and ileocecal valve. The                            colonoscopy was performed without difficulty. The                            patient tolerated the procedure well. The quality                            of the bowel preparation was excellent. The                            ileocecal valve, appendiceal orifice, and rectum                            were photographed. Scope In: 10:28:54 AM Scope Out: 10:47:01 AM Scope Withdrawal Time: 0 hours 8 minutes 35 seconds  Total Procedure Duration: 0 hours 18 minutes 7 seconds  Findings:                 The perianal and digital rectal examinations were                            normal.                           A less than 1 mm polyp was found in the cecum. The                            polyp was sessile. The polyp was removed with a                            cold biopsy forceps. Resection and retrieval were                            complete.                           Scattered small and large-mouthed diverticula were                            found in the sigmoid colon, descending colon,  transverse colon and ascending colon.                           Non-bleeding internal hemorrhoids were found during                            retroflexion. The hemorrhoids were small.                           The exam was otherwise without abnormality. Complications:            No immediate complications. Estimated Blood Loss:     Estimated blood loss was minimal. Impression:               - One less than 1 mm polyp in the cecum, removed                            with a cold biopsy forceps. Resected and retrieved.                           - Diverticulosis in the sigmoid colon, in the                            descending colon, in the transverse colon and in                            the ascending colon.                           - Non-bleeding internal hemorrhoids.                           - The  examination was otherwise normal. Recommendation:           - Patient has a contact number available for                            emergencies. The signs and symptoms of potential                            delayed complications were discussed with the                            patient. Return to normal activities tomorrow.                            Written discharge instructions were provided to the                            patient.                           - Resume previous diet.                           - Continue present medications.                           -  Await pathology results.                           - Repeat colonoscopy in 5-10 years for surveillance                            based on pathology results. Mauri Pole, MD 04/16/2019 10:56:35 AM This report has been signed electronically.

## 2019-04-16 NOTE — Progress Notes (Signed)
Temp taken by JB VS taken by [place] 

## 2019-04-16 NOTE — Patient Instructions (Signed)
Handouts given for gastritis, polyps, diverticulosis, and hemorrhoids.  Pick up Rx for Prilosec.  Avoid NSAIDS(aspirin, aspirin containing products such as pepto bismol, aleve, naproxen, goody powders, excedrin, advil, ibuprofen, etc.)   YOU HAD AN ENDOSCOPIC PROCEDURE TODAY AT [facility]:   Refer to the procedure report that was given to you for any specific questions about what was found during the examination.  If the procedure report does not answer your questions, please call your gastroenterologist to clarify.  If you requested that your care partner not be given the details of your procedure findings, then the procedure report has been included in a sealed envelope for you to review at your convenience later.  YOU SHOULD EXPECT: Some feelings of bloating in the abdomen. Passage of more gas than usual.  Walking can help get rid of the air that was put into your GI tract during the procedure and reduce the bloating. If you had a lower endoscopy (such as a colonoscopy or flexible sigmoidoscopy) you may notice spotting of blood in your stool or on the toilet paper. If you underwent a bowel prep for your procedure, you may not have a normal bowel movement for a few days.  Please Note:  You might notice some irritation and congestion in your nose or some drainage.  This is from the oxygen used during your procedure.  There is no need for concern and it should clear up in a day or so.  SYMPTOMS TO REPORT IMMEDIATELY:   Following lower endoscopy (colonoscopy or flexible sigmoidoscopy):  Excessive amounts of blood in the stool  Significant tenderness or worsening of abdominal pains  Swelling of the abdomen that is new, acute  Fever of 100F or higher   Following upper endoscopy (EGD)  Vomiting of blood or coffee ground material  New chest pain or pain under the shoulder blades  Painful or persistently difficult swallowing  New shortness of breath  Fever of 100F or  higher  Black, tarry-looking stools  For urgent or emergent issues, a gastroenterologist can be reached at any hour by calling [phone].   DIET:  We do recommend a small meal at first, but then you may proceed to your regular diet.  Drink plenty of fluids but you should avoid alcoholic beverages for 24 hours.  ACTIVITY:  You should plan to take it easy for the rest of today and you should NOT DRIVE or use heavy machinery until tomorrow (because of the sedation medicines used during the test).    FOLLOW UP: Our staff will call the number listed on your records 48-72 hours following your procedure to check on you and address any questions or concerns that you may have regarding the information given to you following your procedure. If we do not reach you, we will leave a message.  We will attempt to reach you two times.  During this call, we will ask if you have developed any symptoms of COVID 19. If you develop any symptoms (ie: fever, flu-like symptoms, shortness of breath, cough etc.) before then, please call [phone].  If you test positive for Covid 19 in the 2 weeks post procedure, please call and report this information to [place].    If any biopsies were taken you will be contacted by phone or by letter within the next 1-3 weeks.  Please call us at [phone] if you have not heard about the biopsies in 3 weeks.    SIGNATURES/CONFIDENTIALITY: You and/or your care partner  have signed paperwork which will be entered into your electronic medical record.  These signatures attest to the fact that that the information above on your After Visit Summary has been reviewed and is understood.  Full responsibility of the confidentiality of this discharge information lies with you and/or your care-partner.

## 2019-04-16 NOTE — Progress Notes (Signed)
A and O x3. Report to RN. Tolerated MAC anesthesia well.Teeth unchanged after procedure.

## 2019-04-18 ENCOUNTER — Telehealth: Payer: Self-pay

## 2019-04-18 NOTE — Telephone Encounter (Signed)
  Follow up Call-  Call back number 04/16/2019  Post procedure Call Back phone  # [phone]  Permission to leave phone message Yes  Some recent data might be hidden     Patient questions:  Do you have a fever, pain , or abdominal swelling? No. Pain Score  0 *  Have you tolerated food without any problems? Yes.    Have you been able to return to your normal activities? Yes.    Do you have any questions about your discharge instructions: Diet   No. Medications  No. Follow up visit  No.  Do you have questions or concerns about your Care? No.  Actions: * If pain score is 4 or above: No action needed, pain <4.  1. Have you developed a fever since your procedure? no  2.   Have you had an respiratory symptoms (SOB or cough) since your procedure? no  3.   Have you tested positive for COVID 19 since your procedure no  4.   Have you had any family members/close contacts diagnosed with the COVID 19 since your procedure?  no   If yes to any of these questions please route to Joylene John, RN and Alphonsa Gin, Therapist, sports.

## 2019-04-18 NOTE — Telephone Encounter (Signed)
  Follow up Call-  Call back number 04/16/2019  Post procedure Call Back phone  # [phone]  Permission to leave phone message Yes  Some recent data might be hidden     Patient questions:  Do you have a fever, pain , or abdominal swelling? No. Pain Score  0 *  Have you tolerated food without any problems? Yes.    Have you been able to return to your normal activities? Yes.    Do you have any questions about your discharge instructions: Diet   No. Medications  No. Follow up visit  No.  Do you have questions or concerns about your Care? No.  Actions: * If pain score is 4 or above: No action needed, pain <4.  1. Have you developed a fever since your procedure? no  2.   Have you had an respiratory symptoms (SOB or cough) since your procedure? no  3.   Have you tested positive for COVID 19 since your procedure no  4.   Have you had any family members/close contacts diagnosed with the COVID 19 since your procedure?  no   If yes to any of these questions please route to Joylene John, RN and Alphonsa Gin, Therapist, sports.

## 2019-04-25 ENCOUNTER — Encounter: Payer: Self-pay | Admitting: Gastroenterology

## 2019-05-02 ENCOUNTER — Emergency Department (HOSPITAL_COMMUNITY): Payer: Medicare Other

## 2019-05-02 ENCOUNTER — Other Ambulatory Visit: Payer: Self-pay

## 2019-05-02 ENCOUNTER — Emergency Department (HOSPITAL_COMMUNITY)
Admission: EM | Admit: 2019-05-02 | Discharge: 2019-05-02 | Discharge status: Against Medical Advice | Payer: Medicare Other | Attending: Emergency Medicine | Admitting: Emergency Medicine

## 2019-05-02 DIAGNOSIS — R0789 Other chest pain: Secondary | ICD-10-CM | POA: Diagnosis present

## 2019-05-02 DIAGNOSIS — Z5321 Procedure and treatment not carried out due to patient leaving prior to being seen by health care provider: Secondary | ICD-10-CM | POA: Diagnosis not present

## 2019-05-02 LAB — BASIC METABOLIC PANEL
Anion gap: 10 (ref 5–15)
BUN: 7 mg/dL — ABNORMAL LOW (ref 8–23)
CO2: 25 mmol/L (ref 22–32)
Calcium: 9.7 mg/dL (ref 8.9–10.3)
Chloride: 102 mmol/L (ref 98–111)
Creatinine, Ser: 1.14 mg/dL — ABNORMAL HIGH (ref 0.44–1.00)
GFR calc Af Amer: 55 mL/min — ABNORMAL LOW (ref >60)
GFR calc non Af Amer: 47 mL/min — ABNORMAL LOW (ref >60)
Glucose, Bld: 109 mg/dL — ABNORMAL HIGH (ref 70–99)
Potassium: 3.3 mmol/L — ABNORMAL LOW (ref 3.5–5.1)
Sodium: 137 mmol/L (ref 135–145)

## 2019-05-02 LAB — CBC
HCT: 39.4 % (ref 36.0–46.0)
Hemoglobin: 13.2 g/dL (ref 12.0–15.0)
MCH: 30.9 pg (ref 26.0–34.0)
MCHC: 33.5 g/dL (ref 30.0–36.0)
MCV: 92.3 fL (ref 80.0–100.0)
Platelets: 217 10*3/uL (ref 150–400)
RBC: 4.27 MIL/uL (ref 3.87–5.11)
RDW: 13.7 % (ref 11.5–15.5)
WBC: 5.7 10*3/uL (ref 4.0–10.5)
nRBC: 0 % (ref 0.0–0.2)

## 2019-05-02 LAB — TROPONIN I (HIGH SENSITIVITY)
Troponin I (High Sensitivity): 5 ng/L (ref <18)
Troponin I (High Sensitivity): 5 ng/L (ref <18)

## 2019-05-02 MED ORDER — SODIUM CHLORIDE 0.9% FLUSH: 3.0000 mL | Freq: Once | INTRAVENOUS | Status: DC

## 2019-05-02 NOTE — ED Triage Notes (Signed)
Pt presents with chest pain starting today.

## 2019-06-22 ENCOUNTER — Ambulatory Visit (INDEPENDENT_AMBULATORY_CARE_PROVIDER_SITE_OTHER): Payer: Medicare Other | Admitting: Gastroenterology

## 2019-06-22 ENCOUNTER — Encounter: Payer: Self-pay | Admitting: Gastroenterology

## 2019-06-22 ENCOUNTER — Other Ambulatory Visit (INDEPENDENT_AMBULATORY_CARE_PROVIDER_SITE_OTHER): Payer: Medicare Other

## 2019-06-22 VITALS — BP 122/88 | HR 64 | Temp 97.1°F | Ht 59.0 in | Wt 115.5 lb

## 2019-06-22 DIAGNOSIS — D509 Iron deficiency anemia, unspecified: Secondary | ICD-10-CM

## 2019-06-22 DIAGNOSIS — K2970 Gastritis, unspecified, without bleeding: Secondary | ICD-10-CM

## 2019-06-22 DIAGNOSIS — K219 Gastro-esophageal reflux disease without esophagitis: Secondary | ICD-10-CM

## 2019-06-22 DIAGNOSIS — K2990 Gastroduodenitis, unspecified, without bleeding: Secondary | ICD-10-CM

## 2019-06-22 LAB — CBC WITH DIFFERENTIAL/PLATELET
Basophils Absolute: 0.1 10*3/uL (ref 0.0–0.1)
Basophils Relative: 1.2 % (ref 0.0–3.0)
Eosinophils Absolute: 0.1 10*3/uL (ref 0.0–0.7)
Eosinophils Relative: 1.9 % (ref 0.0–5.0)
HCT: 37.8 % (ref 36.0–46.0)
Hemoglobin: 12.6 g/dL (ref 12.0–15.0)
Lymphocytes Relative: 36.2 % (ref 12.0–46.0)
Lymphs Abs: 2 10*3/uL (ref 0.7–4.0)
MCHC: 33.2 g/dL (ref 30.0–36.0)
MCV: 89.4 fl (ref 78.0–100.0)
Monocytes Absolute: 0.5 10*3/uL (ref 0.1–1.0)
Monocytes Relative: 8.4 % (ref 3.0–12.0)
Neutro Abs: 2.9 10*3/uL (ref 1.4–7.7)
Neutrophils Relative %: 52.3 % (ref 43.0–77.0)
Platelets: 204 10*3/uL (ref 150.0–400.0)
RBC: 4.23 Mil/uL (ref 3.87–5.11)
RDW: 13.6 % (ref 11.5–15.5)
WBC: 5.5 10*3/uL (ref 4.0–10.5)

## 2019-06-22 LAB — IBC + FERRITIN
Ferritin: 50.4 ng/mL (ref 10.0–291.0)
Iron: 53 ug/dL (ref 42–145)
Saturation Ratios: 14 % — ABNORMAL LOW (ref 20.0–50.0)
Transferrin: 271 mg/dL (ref 212.0–360.0)

## 2019-06-22 MED ORDER — OMEPRAZOLE 20 MG PO CPDR: 20.0000 mg | DELAYED_RELEASE_CAPSULE | Freq: Every day | ORAL | 3 refills | Status: DC
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
## 2019-06-22 NOTE — Patient Instructions (Signed)
[place] August 23, 1945 [id number]   1. 11/13 Seven (7) days prior to capsule endoscopy stop taking iron supplements and carafate.  2. 11/18 Two (2) days prior to capsule endoscopy stop taking aspirin or any arthritis drugs.  3. 11/19 Day before capsule endoscopy purchase a 238 gram bottle of Miralax from the laxative section of your drug store, and a 32 oz. bottle of Gatorade (no red).    4. 11/19 One (1) day prior to capsule endoscopy: a) Stop smoking. b) Eat a regular diet until 12:00 Noon. c) After 12:00 Noon take only the following: Black coffee  Jell-O (no fruit or red Jell-o) Water   Bouillon (chicken or beef) 7-Up   Cranberry Juice Tea   Kool-Aid Popsicle (not red) Sprite   Coke Ginger Ale  Pepsi Mountain Dew Gatorade d) At 6:00 pm the evening before your appointment, drink 7 capfuls (105 grams) of Miralax with 32 oz. Gatorade. Drink 8 oz every 15 minutes until gone. e) Nothing to eat or drink after midnight except medications with a sip of water.  5. 11/20 Day of capsule endoscopy: Wear loose two-piece clothing to your exam. No medications for 2 hours prior to your test.    For any questions: Call [facility] at [phone] and ask to speak with one of the capsule endoscopy nurses.     The above instructions have been reviewed and explained to me by________________   Patient signature:_________________________________________     Date:________________   GO TO THE BASEMENT FOR LABS TODAY  I appreciate the  opportunity to care for you  Thank You   Harl Bowie , MD

## 2019-06-27 ENCOUNTER — Other Ambulatory Visit (INDEPENDENT_AMBULATORY_CARE_PROVIDER_SITE_OTHER): Payer: Medicare Other

## 2019-06-27 DIAGNOSIS — D509 Iron deficiency anemia, unspecified: Secondary | ICD-10-CM | POA: Diagnosis not present

## 2019-06-27 LAB — FECAL OCCULT BLOOD, IMMUNOCHEMICAL: Fecal Occult Bld: NEGATIVE

## 2020-04-08 ENCOUNTER — Other Ambulatory Visit: Payer: Self-pay

## 2020-04-08 ENCOUNTER — Other Ambulatory Visit: Payer: Medicare Other

## 2020-04-08 DIAGNOSIS — Z20822 Contact with and (suspected) exposure to covid-19: Secondary | ICD-10-CM

## 2020-04-09 LAB — SARS-COV-2, NAA 2 DAY TAT

## 2020-04-09 LAB — NOVEL CORONAVIRUS, NAA: SARS-CoV-2, NAA: NOT DETECTED

## 2020-05-14 ENCOUNTER — Other Ambulatory Visit: Payer: Self-pay

## 2020-05-14 ENCOUNTER — Encounter (HOSPITAL_COMMUNITY): Payer: Self-pay | Admitting: Emergency Medicine

## 2020-05-14 ENCOUNTER — Ambulatory Visit (HOSPITAL_COMMUNITY)
Admission: EM | Admit: 2020-05-14 | Discharge: 2020-05-14 | Disposition: A | Discharge status: Home / Self Care | Payer: Medicare Other

## 2020-05-14 DIAGNOSIS — L989 Disorder of the skin and subcutaneous tissue, unspecified: Secondary | ICD-10-CM

## 2020-05-14 DIAGNOSIS — W57XXXA Bitten or stung by nonvenomous insect and other nonvenomous arthropods, initial encounter: Secondary | ICD-10-CM

## 2020-05-14 MED ORDER — DOXYCYCLINE HYCLATE 100 MG PO CAPS: 100.0000 mg | ORAL_CAPSULE | Freq: Two times a day (BID) | ORAL | 0 refills | Status: DC

## 2020-05-14 NOTE — ED Triage Notes (Signed)
Pt c/o tick embedded in her right leg x 2 days ago. She has not been able to remove the body completely. She denies fever.
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
## 2020-05-17 ENCOUNTER — Telehealth (HOSPITAL_COMMUNITY): Payer: Self-pay | Admitting: Emergency Medicine

## 2020-05-17 MED ORDER — DOXYCYCLINE HYCLATE 100 MG PO CAPS: 100.0000 mg | ORAL_CAPSULE | Freq: Two times a day (BID) | ORAL | 0 refills | Status: DC

## 2020-05-23 DIAGNOSIS — Z23 Encounter for immunization: Secondary | ICD-10-CM | POA: Diagnosis not present

## 2020-05-23 DIAGNOSIS — F064 Anxiety disorder due to known physiological condition: Secondary | ICD-10-CM | POA: Diagnosis not present

## 2020-05-23 DIAGNOSIS — I10 Essential (primary) hypertension: Secondary | ICD-10-CM | POA: Diagnosis not present

## 2020-05-23 DIAGNOSIS — N189 Chronic kidney disease, unspecified: Secondary | ICD-10-CM | POA: Diagnosis not present

## 2020-05-23 DIAGNOSIS — F6089 Other specific personality disorders: Secondary | ICD-10-CM | POA: Diagnosis not present

## 2020-06-27 ENCOUNTER — Other Ambulatory Visit: Payer: Self-pay | Admitting: Otolaryngology

## 2020-06-27 DIAGNOSIS — R42 Dizziness and giddiness: Secondary | ICD-10-CM

## 2020-07-04 ENCOUNTER — Ambulatory Visit: Payer: Medicare Other

## 2020-07-04 ENCOUNTER — Ambulatory Visit
Admission: RE | Admit: 2020-07-04 | Discharge: 2020-07-04 | Disposition: A | Discharge status: Home / Self Care | Payer: Medicare Other | Source: Ambulatory Visit | Attending: Otolaryngology | Admitting: Otolaryngology

## 2020-07-04 ENCOUNTER — Other Ambulatory Visit: Payer: Self-pay

## 2020-07-04 DIAGNOSIS — R42 Dizziness and giddiness: Secondary | ICD-10-CM | POA: Diagnosis present

## 2020-07-04 DIAGNOSIS — M130 Polyarthritis, unspecified: Secondary | ICD-10-CM | POA: Diagnosis not present

## 2020-07-04 DIAGNOSIS — I10 Essential (primary) hypertension: Secondary | ICD-10-CM | POA: Diagnosis not present

## 2020-07-07 ENCOUNTER — Other Ambulatory Visit: Payer: Self-pay | Admitting: Otolaryngology

## 2020-07-07 DIAGNOSIS — R42 Dizziness and giddiness: Secondary | ICD-10-CM

## 2020-07-25 ENCOUNTER — Other Ambulatory Visit: Payer: Self-pay

## 2020-07-25 ENCOUNTER — Ambulatory Visit
Admission: RE | Admit: 2020-07-25 | Discharge: 2020-07-25 | Disposition: A | Discharge status: Home / Self Care | Payer: Medicare Other | Source: Ambulatory Visit | Attending: Otolaryngology | Admitting: Otolaryngology

## 2020-07-25 DIAGNOSIS — R42 Dizziness and giddiness: Secondary | ICD-10-CM | POA: Diagnosis present

## 2020-07-25 MED ORDER — GADOBUTROL 1 MMOL/ML IV SOLN
5.0000 mL | Freq: Once | INTRAVENOUS | Status: AC | PRN
  Administered 2020-07-25: 5 mL via INTRAVENOUS

## 2020-08-17 DIAGNOSIS — Z23 Encounter for immunization: Secondary | ICD-10-CM | POA: Diagnosis not present

## 2020-08-21 DIAGNOSIS — R42 Dizziness and giddiness: Secondary | ICD-10-CM | POA: Diagnosis not present

## 2020-08-21 DIAGNOSIS — H903 Sensorineural hearing loss, bilateral: Secondary | ICD-10-CM | POA: Diagnosis not present

## 2020-08-29 ENCOUNTER — Ambulatory Visit: Payer: Medicare Other | Attending: Family Medicine

## 2020-08-29 ENCOUNTER — Other Ambulatory Visit: Payer: Self-pay

## 2020-08-29 DIAGNOSIS — R42 Dizziness and giddiness: Secondary | ICD-10-CM | POA: Insufficient documentation

## 2020-08-29 DIAGNOSIS — R2681 Unsteadiness on feet: Secondary | ICD-10-CM | POA: Diagnosis not present

## 2020-08-29 DIAGNOSIS — R262 Difficulty in walking, not elsewhere classified: Secondary | ICD-10-CM | POA: Insufficient documentation
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
## 2020-09-05 ENCOUNTER — Ambulatory Visit: Payer: Medicare Other | Admitting: Physical Therapy

## 2020-09-12 ENCOUNTER — Other Ambulatory Visit: Payer: Self-pay

## 2020-09-12 ENCOUNTER — Encounter: Payer: Self-pay | Admitting: Physical Therapy

## 2020-09-12 ENCOUNTER — Ambulatory Visit: Payer: Medicare Other | Admitting: Physical Therapy

## 2020-09-12 DIAGNOSIS — R2681 Unsteadiness on feet: Secondary | ICD-10-CM

## 2020-09-12 DIAGNOSIS — R42 Dizziness and giddiness: Secondary | ICD-10-CM

## 2020-09-12 DIAGNOSIS — R262 Difficulty in walking, not elsewhere classified: Secondary | ICD-10-CM
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
## 2020-09-12 NOTE — Patient Instructions (Signed)
Gaze Stabilization: Sitting    Keeping eyes on target on wall 3 feet away, and move head side to side for _60___ seconds. Repeat while moving head up and down for __60__ seconds. Do __2 sets, 3 times a day.  Copyright  VHI. All rights reserved.   Gaze Stabilization: Tip Card  1.Target must remain in focus, not blurry, and appear stationary while head is in motion. 2.Perform exercises with small head movements (45 to either side of midline). 3.Increase speed of head motion so long as target is in focus. 4.If you wear eyeglasses, be sure you can see target through lens (therapist will give specific instructions for bifocal / progressive lenses). 5.These exercises may provoke dizziness or nausea. Work through these symptoms. If too dizzy, slow head movement slightly. Rest between each exercise. 6.Exercises demand concentration; avoid distractions.  Copyright  VHI. All rights reserved.

## 2020-09-19 ENCOUNTER — Ambulatory Visit: Payer: Medicare Other | Attending: Family Medicine | Admitting: Physical Therapy

## 2020-09-19 DIAGNOSIS — R262 Difficulty in walking, not elsewhere classified: Secondary | ICD-10-CM | POA: Insufficient documentation

## 2020-09-19 DIAGNOSIS — R2681 Unsteadiness on feet: Secondary | ICD-10-CM | POA: Insufficient documentation

## 2020-09-19 DIAGNOSIS — R42 Dizziness and giddiness: Secondary | ICD-10-CM | POA: Insufficient documentation

## 2020-09-26 ENCOUNTER — Other Ambulatory Visit: Payer: Self-pay

## 2020-09-26 ENCOUNTER — Encounter: Payer: Self-pay | Admitting: Physical Therapy

## 2020-09-26 ENCOUNTER — Ambulatory Visit: Payer: Medicare Other | Admitting: Physical Therapy

## 2020-09-26 DIAGNOSIS — R262 Difficulty in walking, not elsewhere classified: Secondary | ICD-10-CM | POA: Diagnosis not present

## 2020-09-26 DIAGNOSIS — R2681 Unsteadiness on feet: Secondary | ICD-10-CM | POA: Diagnosis not present

## 2020-09-26 DIAGNOSIS — R42 Dizziness and giddiness: Secondary | ICD-10-CM | POA: Diagnosis not present

## 2020-09-26 NOTE — Patient Instructions (Addendum)
Gaze Stabilization - Tip Card  1.Target must remain in focus, not blurry, and appear stationary while head is in motion. 2.Perform exercises with small head movements (45 to either side of midline). 3.Increase speed of head motion so long as target is in focus. 4.If you wear eyeglasses, be sure you can see target through lens (therapist will give specific instructions for bifocal / progressive lenses). 5.These exercises may provoke dizziness or nausea. Work through these symptoms. If too dizzy, slow head movement slightly. Rest between each exercise. 6.Exercises demand concentration; avoid distractions. 7.For safety, perform standing exercises close to a counter, wall, corner, or next to someone.  Copyright  VHI. All rights reserved.   Gaze Stabilization - Standing Feet Apart   Feet shoulder width apart, keeping eyes on target on wall 3 feet away, tilt head down slightly and move head side to side for 60 seconds. Repeat while moving head up and down for 60 seconds. 2 rounds. Do 2-3 sessions per day.    Marching In-Place with 90 degree turns    Standing with your back to a corner; march in place and after 5 steps make a 90 degree turn to the Right, keep marching and turn back to center, take 5 steps in place and turn 90 degrees to the Left, keep marching and turn back to center.  Perform 5 times to each side Do _1__ sets. Do _2__ times per day.   Feet Partial Heel-Toe, Head Motion - Eyes Closed   With eyes closed and right foot partially in front of the other, move head slowly side to side, 5 times.  Switch feet and repeat with eyes closed. Repeat 2 times per session. Do 2 sessions per day.
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
## 2020-10-02 DIAGNOSIS — M792 Neuralgia and neuritis, unspecified: Secondary | ICD-10-CM | POA: Diagnosis not present

## 2020-10-02 DIAGNOSIS — M79672 Pain in left foot: Secondary | ICD-10-CM | POA: Diagnosis not present

## 2020-10-02 DIAGNOSIS — L602 Onychogryphosis: Secondary | ICD-10-CM | POA: Diagnosis not present

## 2020-10-03 ENCOUNTER — Ambulatory Visit: Payer: Medicare Other

## 2020-10-03 ENCOUNTER — Other Ambulatory Visit: Payer: Self-pay

## 2020-10-03 DIAGNOSIS — R262 Difficulty in walking, not elsewhere classified: Secondary | ICD-10-CM

## 2020-10-03 DIAGNOSIS — R2681 Unsteadiness on feet: Secondary | ICD-10-CM | POA: Diagnosis not present

## 2020-10-03 DIAGNOSIS — R42 Dizziness and giddiness: Secondary | ICD-10-CM
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
## 2020-10-03 NOTE — Patient Instructions (Signed)
Gaze Stabilization - Tip Card  1.Target must remain in focus, not blurry, and appear stationary while head is in motion. 2.Perform exercises with small head movements (45 to either side of midline). 3.Increase speed of head motion so long as target is in focus. 4.If you wear eyeglasses, be sure you can see target through lens (therapist will give specific instructions for bifocal / progressive lenses). 5.These exercises may provoke dizziness or nausea. Work through these symptoms. If too dizzy, slow head movement slightly. Rest between each exercise. 6.Exercises demand concentration; avoid distractions. 7.For safety, perform standing exercises close to a counter, wall, corner, or next to someone.  Copyright  VHI. All rights reserved.   Gaze Stabilization - Standing Feet Apart   Feet shoulder width apart, keeping eyes on target on wall 3 feet away, tilt head down slightly and move head side to side for 60 seconds. Repeat while moving head up and down for 60 seconds. 2 rounds. Do 2-3 sessions per day.    Marching In-Place with 90 degree turns    Standing with your back to a corner; march in place and after 5 steps make a 90 degree turn to the Right, keep marching and turn back to center, take 5 steps in place and turn 90 degrees to the Left, keep marching and turn back to center.  Perform 5 times to each side Do _1__ sets. Do _2__ times per day.   Feet Partial Heel-Toe, Head Motion - Eyes Closed   With eyes closed and right foot partially in front of the other, move head slowly side to side, 5 times.  Switch feet and repeat with eyes closed. Repeat 2 times per session. Do 2 sessions per day.

## 2020-10-10 ENCOUNTER — Ambulatory Visit: Payer: Medicare Other

## 2020-10-10 ENCOUNTER — Other Ambulatory Visit: Payer: Self-pay

## 2020-10-10 DIAGNOSIS — R2681 Unsteadiness on feet: Secondary | ICD-10-CM | POA: Diagnosis not present

## 2020-10-10 DIAGNOSIS — R42 Dizziness and giddiness: Secondary | ICD-10-CM | POA: Diagnosis not present

## 2020-10-10 DIAGNOSIS — R262 Difficulty in walking, not elsewhere classified: Secondary | ICD-10-CM

## 2020-10-10 NOTE — Patient Instructions (Addendum)
Settings for NuStep:   Handles: 9  Seat: 6  Resistance: 4-5  Steps Per Minute: keep above 40-45 Time: Complete 10 minutes initially, slowly progress time but 1-2 minutes as able.      Step Up    Step up with left leg, following with right leg. Return. Repeat 10 times. Do 2 sessions per day.  Copyright  VHI. All rights reserved.    Balance: One-Leg    Stand on left leg on Foot Triangle of Support. Use hand supports. Let go with left hand, then right hand. Support as little as possible but as much as needed. Stand 10-15  seconds. Repeat, standing on right leg.  Copyright  VHI. All rights reserved.
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
## 2020-10-17 ENCOUNTER — Other Ambulatory Visit: Payer: Self-pay

## 2020-10-17 ENCOUNTER — Ambulatory Visit: Payer: Medicare Other | Attending: Family Medicine | Admitting: Physical Therapy

## 2020-10-17 ENCOUNTER — Encounter: Payer: Self-pay | Admitting: Physical Therapy

## 2020-10-17 DIAGNOSIS — R262 Difficulty in walking, not elsewhere classified: Secondary | ICD-10-CM | POA: Diagnosis not present

## 2020-10-17 DIAGNOSIS — R2681 Unsteadiness on feet: Secondary | ICD-10-CM | POA: Diagnosis not present

## 2020-10-17 DIAGNOSIS — R42 Dizziness and giddiness: Secondary | ICD-10-CM | POA: Diagnosis not present

## 2020-10-17 NOTE — Patient Instructions (Addendum)
Step Up    Step up with left leg, following with right leg. Return. Repeat 10 times. Do 2 sessions per day.    Balance: One-Leg    Stand on left leg on Foot Triangle of Support. Use hand supports. Let go with left hand, then right hand. Support as little as possible but as much as needed. Stand 10-15  seconds. Repeat, standing on right leg.   Gaze Stabilization: Standing With Foot on Step    Have a chair next to you for support if needed.  Left foot on __4__ inch step, keeping eyes on target on wall __3__ feet away, tilt head down 15-30 and move head side to side for __60__ seconds. Repeat while moving head up and down for __60__ seconds.  Switch feet and repeat. Do __2__ sessions per day.   Marching In-Place with 90 degree turns    Standing with your back to a corner; march in place and after 5 steps make a 90 degree turn to the Right, keep marching and turn back to center, take 5 steps in place and turn 90 degrees to the Left, keep marching and turn back to center.  Perform 5 times to each side Do _1__ sets. Do _2__ times per day.   Feet Partial Heel-Toe, Head Motion - Eyes Closed   With eyes closed and right foot partially in front of the other, move head slowly side to side, 5 times.  Switch feet and repeat with eyes closed. Repeat 2 times per session. Do 2 sessions per day.
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
## 2020-10-24 ENCOUNTER — Encounter: Payer: Self-pay | Admitting: Physical Therapy

## 2020-10-24 ENCOUNTER — Other Ambulatory Visit: Payer: Self-pay

## 2020-10-24 ENCOUNTER — Ambulatory Visit: Payer: Medicare Other | Admitting: Physical Therapy

## 2020-10-24 DIAGNOSIS — R42 Dizziness and giddiness: Secondary | ICD-10-CM | POA: Diagnosis not present

## 2020-10-24 DIAGNOSIS — R2681 Unsteadiness on feet: Secondary | ICD-10-CM | POA: Diagnosis not present

## 2020-10-24 DIAGNOSIS — R262 Difficulty in walking, not elsewhere classified: Secondary | ICD-10-CM | POA: Diagnosis not present
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
## 2020-10-31 ENCOUNTER — Encounter: Payer: Self-pay | Admitting: Physical Therapy

## 2020-10-31 ENCOUNTER — Other Ambulatory Visit: Payer: Self-pay

## 2020-10-31 ENCOUNTER — Ambulatory Visit: Payer: Medicare Other | Admitting: Physical Therapy

## 2020-10-31 DIAGNOSIS — R2681 Unsteadiness on feet: Secondary | ICD-10-CM

## 2020-10-31 DIAGNOSIS — R42 Dizziness and giddiness: Secondary | ICD-10-CM

## 2020-10-31 DIAGNOSIS — R262 Difficulty in walking, not elsewhere classified: Secondary | ICD-10-CM | POA: Diagnosis not present

## 2020-10-31 NOTE — Patient Instructions (Signed)
Step Up    Step up with left leg, following with right leg. Return. Repeat 10 times. Do 2 sessions per day.    Balance: One-Leg    Stand on left leg on Foot Triangle of Support. Use hand supports. Let go with left hand, then right hand. Support as little as possible but as much as needed. Stand 10-15  seconds. Repeat, standing on right leg. Make sure to stand tall and keep your pelvis level   Gaze Stabilization: Standing With Foot on Step    Have a chair next to you for support if needed.  Left foot on __4__ inch step, keeping eyes on target on wall __3__ feet away, tilt head down 15-30 and move head side to side for __60__ seconds. Repeat while moving head up and down for __60__ seconds.  Switch feet and repeat. Do __2__ sessions per day.   Marching In-Place with 90 degree turns    Standing with your back to a corner; march in place and after 5 steps make a 90 degree turn to the Right, keep marching and turn back to center, take 5 steps in place and turn 90 degrees to the Left, keep marching and turn back to center.  Perform 5 times to each side Do _1__ sets. Do _2__ times per day.   Feet Partial Heel-Toe, Head Motion - Eyes Closed   With eyes closed and right foot partially in front of the other, move head slowly side to side, 10 times.  Switch feet and repeat with eyes closed. Repeat 2 times per session. Do 2 sessions per day.
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
## 2020-11-07 DIAGNOSIS — F064 Anxiety disorder due to known physiological condition: Secondary | ICD-10-CM | POA: Diagnosis not present

## 2020-11-07 DIAGNOSIS — H8309 Labyrinthitis, unspecified ear: Secondary | ICD-10-CM | POA: Diagnosis not present

## 2020-11-07 DIAGNOSIS — I10 Essential (primary) hypertension: Secondary | ICD-10-CM | POA: Diagnosis not present

## 2020-11-07 DIAGNOSIS — M130 Polyarthritis, unspecified: Secondary | ICD-10-CM | POA: Diagnosis not present

## 2020-11-07 DIAGNOSIS — H9193 Unspecified hearing loss, bilateral: Secondary | ICD-10-CM | POA: Diagnosis not present

## 2020-11-27 DIAGNOSIS — F064 Anxiety disorder due to known physiological condition: Secondary | ICD-10-CM | POA: Diagnosis not present

## 2020-11-27 DIAGNOSIS — I10 Essential (primary) hypertension: Secondary | ICD-10-CM | POA: Diagnosis not present

## 2020-11-27 DIAGNOSIS — E876 Hypokalemia: Secondary | ICD-10-CM | POA: Diagnosis not present

## 2020-11-27 DIAGNOSIS — M130 Polyarthritis, unspecified: Secondary | ICD-10-CM | POA: Diagnosis not present

## 2020-11-27 DIAGNOSIS — G2581 Restless legs syndrome: Secondary | ICD-10-CM | POA: Diagnosis not present

## 2020-12-12 ENCOUNTER — Ambulatory Visit: Payer: Medicare Other | Admitting: Physical Therapy

## 2020-12-19 DIAGNOSIS — H401133 Primary open-angle glaucoma, bilateral, severe stage: Secondary | ICD-10-CM | POA: Diagnosis not present

## 2020-12-26 DIAGNOSIS — Z23 Encounter for immunization: Secondary | ICD-10-CM | POA: Diagnosis not present

## 2021-02-08 ENCOUNTER — Ambulatory Visit (HOSPITAL_COMMUNITY): Payer: Medicare Other

## 2021-02-09 ENCOUNTER — Ambulatory Visit (INDEPENDENT_AMBULATORY_CARE_PROVIDER_SITE_OTHER): Payer: Medicare Other

## 2021-02-09 ENCOUNTER — Encounter (HOSPITAL_COMMUNITY): Payer: Self-pay

## 2021-02-09 ENCOUNTER — Ambulatory Visit (HOSPITAL_COMMUNITY)
Admission: EM | Admit: 2021-02-09 | Discharge: 2021-02-09 | Disposition: A | Discharge status: Home / Self Care | Payer: Medicare Other | Attending: Physician Assistant | Admitting: Physician Assistant

## 2021-02-09 DIAGNOSIS — M79671 Pain in right foot: Secondary | ICD-10-CM

## 2021-02-09 DIAGNOSIS — I10 Essential (primary) hypertension: Secondary | ICD-10-CM

## 2021-02-09 DIAGNOSIS — S99921A Unspecified injury of right foot, initial encounter: Secondary | ICD-10-CM | POA: Diagnosis not present

## 2021-02-09 DIAGNOSIS — R52 Pain, unspecified: Secondary | ICD-10-CM

## 2021-02-09 NOTE — ED Notes (Signed)
Blood pressure reported to charge nurse.

## 2021-02-09 NOTE — ED Triage Notes (Signed)
Pt reports pain in the right foot. Reports she has 2 pieces of glass in  right foot x 1 month.

## 2021-02-09 NOTE — Discharge Instructions (Addendum)
Your blood pressure is elevated today.  Please take your valsartan when you get home and monitor this at home.  If this remains elevated above 140/90 you need to be reevaluated.  If you develop any chest pain, shortness of breath, leg swelling, vision changes, worsening dizziness with elevated blood pressure you need to go to the emergency room.  Avoid caffeine, salt, decongestants.  Follow-up with either our clinic or your primary care provider within 1 week to ensure your blood pressure is normal.  They do not see any any glass in her foot.  I would use over-the-counter medication for pain relief and wear supportive footwear.  If you continue to have pain please follow-up with podiatry as we discussed.
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
## 2021-02-26 ENCOUNTER — Emergency Department (HOSPITAL_COMMUNITY): Payer: Medicare Other

## 2021-02-26 ENCOUNTER — Emergency Department (HOSPITAL_COMMUNITY)
Admission: EM | Admit: 2021-02-26 | Discharge: 2021-02-27 | Disposition: A | Discharge status: Home / Self Care | Payer: Medicare Other | Attending: Emergency Medicine | Admitting: Emergency Medicine

## 2021-02-26 ENCOUNTER — Other Ambulatory Visit: Payer: Self-pay

## 2021-02-26 DIAGNOSIS — Z91040 Latex allergy status: Secondary | ICD-10-CM | POA: Diagnosis not present

## 2021-02-26 DIAGNOSIS — E871 Hypo-osmolality and hyponatremia: Secondary | ICD-10-CM | POA: Insufficient documentation

## 2021-02-26 DIAGNOSIS — Z91010 Allergy to peanuts: Secondary | ICD-10-CM | POA: Diagnosis not present

## 2021-02-26 DIAGNOSIS — Z79899 Other long term (current) drug therapy: Secondary | ICD-10-CM | POA: Insufficient documentation

## 2021-02-26 DIAGNOSIS — I10 Essential (primary) hypertension: Secondary | ICD-10-CM | POA: Insufficient documentation

## 2021-02-26 DIAGNOSIS — I2510 Atherosclerotic heart disease of native coronary artery without angina pectoris: Secondary | ICD-10-CM | POA: Diagnosis not present

## 2021-02-26 DIAGNOSIS — Z85831 Personal history of malignant neoplasm of soft tissue: Secondary | ICD-10-CM | POA: Diagnosis not present

## 2021-02-26 DIAGNOSIS — R42 Dizziness and giddiness: Secondary | ICD-10-CM | POA: Diagnosis not present

## 2021-02-26 DIAGNOSIS — R079 Chest pain, unspecified: Secondary | ICD-10-CM | POA: Diagnosis not present

## 2021-02-26 DIAGNOSIS — Z20822 Contact with and (suspected) exposure to covid-19: Secondary | ICD-10-CM | POA: Insufficient documentation

## 2021-02-26 LAB — CBC WITH DIFFERENTIAL/PLATELET
Abs Immature Granulocytes: 0.02 10*3/uL (ref 0.00–0.07)
Basophils Absolute: 0 10*3/uL (ref 0.0–0.1)
Basophils Relative: 1 %
Eosinophils Absolute: 0.1 10*3/uL (ref 0.0–0.5)
Eosinophils Relative: 2 %
HCT: 37.4 % (ref 36.0–46.0)
Hemoglobin: 12.7 g/dL (ref 12.0–15.0)
Immature Granulocytes: 0 %
Lymphocytes Relative: 26 %
Lymphs Abs: 1.7 10*3/uL (ref 0.7–4.0)
MCH: 30 pg (ref 26.0–34.0)
MCHC: 34 g/dL (ref 30.0–36.0)
MCV: 88.4 fL (ref 80.0–100.0)
Monocytes Absolute: 0.6 10*3/uL (ref 0.1–1.0)
Monocytes Relative: 10 %
Neutro Abs: 4 10*3/uL (ref 1.7–7.7)
Neutrophils Relative %: 61 %
Platelets: 220 10*3/uL (ref 150–400)
RBC: 4.23 MIL/uL (ref 3.87–5.11)
RDW: 13.3 % (ref 11.5–15.5)
WBC: 6.4 10*3/uL (ref 4.0–10.5)
nRBC: 0 % (ref 0.0–0.2)

## 2021-02-26 LAB — URINALYSIS, ROUTINE W REFLEX MICROSCOPIC
Bilirubin Urine: NEGATIVE
Glucose, UA: NEGATIVE mg/dL
Hgb urine dipstick: NEGATIVE
Ketones, ur: NEGATIVE mg/dL
Leukocytes,Ua: NEGATIVE
Nitrite: NEGATIVE
Protein, ur: NEGATIVE mg/dL
Specific Gravity, Urine: 1.005 (ref 1.005–1.030)
pH: 9 — ABNORMAL HIGH (ref 5.0–8.0)

## 2021-02-26 LAB — COMPREHENSIVE METABOLIC PANEL
ALT: 15 U/L (ref 0–44)
AST: 20 U/L (ref 15–41)
Albumin: 4.1 g/dL (ref 3.5–5.0)
Alkaline Phosphatase: 74 U/L (ref 38–126)
Anion gap: 11 (ref 5–15)
BUN: 8 mg/dL (ref 8–23)
CO2: 26 mmol/L (ref 22–32)
Calcium: 9.4 mg/dL (ref 8.9–10.3)
Chloride: 84 mmol/L — ABNORMAL LOW (ref 98–111)
Creatinine, Ser: 0.89 mg/dL (ref 0.44–1.00)
GFR, Estimated: >60 mL/min (ref >60)
Glucose, Bld: 105 mg/dL — ABNORMAL HIGH (ref 70–99)
Potassium: 3.3 mmol/L — ABNORMAL LOW (ref 3.5–5.1)
Sodium: 121 mmol/L — ABNORMAL LOW (ref 135–145)
Total Bilirubin: 0.5 mg/dL (ref 0.3–1.2)
Total Protein: 7.4 g/dL (ref 6.5–8.1)

## 2021-02-26 LAB — TROPONIN I (HIGH SENSITIVITY): Troponin I (High Sensitivity): 6 ng/L (ref <18)

## 2021-02-26 NOTE — ED Triage Notes (Signed)
Pt states she went to her grandson's ballgame on Sunday and got overheated.  Slept late on Monday and felt better but as the week has progressed she has increased dizziness, sinus drainage, and feels "dehydrated"  Has been drinking a lot of water she says.  Has had trouble sleeping as well.

## 2021-02-26 NOTE — ED Provider Notes (Signed)
Emergency Medicine Provider Triage Evaluation Note  Karla Richards , a 75 y.o. female  was evaluated in triage.  Pt complains of dizziness, sinus drainage/pressure, and generalized weakness over the past few days. She believes she is dehydrated from going to a baseball game on Sunday. Endorses central chest tightness that occurred just prior to arrival. No history or MI or CVA. No tobacco use. Admits to nausea, but no emesis. No fever or chills  Review of Systems  Positive: Dizziness, nausea Negative: fever  Physical Exam  BP (!) 176/80 (BP Location: Right Arm)   Pulse 63   Temp 97.8 F (36.6 C) (Oral)   Resp 16   SpO2 100%  Gen:   Awake, no distress   Resp:  Normal effort  MSK:   Moves extremities without difficulty  Other:    Medical Decision Making  Medically screening exam initiated at 9:10 PM.  Appropriate orders placed.  Karla Richards was informed that the remainder of the evaluation will be completed by another provider, this initial triage assessment does not replace that evaluation, and the importance of remaining in the ED until their evaluation is complete.  Labs ordered.    Karie Kirks 02/26/21 2112    Orpah Greek, MD 02/27/21 [phone]

## 2021-02-27 ENCOUNTER — Emergency Department (HOSPITAL_COMMUNITY): Payer: Medicare Other

## 2021-02-27 DIAGNOSIS — R42 Dizziness and giddiness: Secondary | ICD-10-CM | POA: Diagnosis not present

## 2021-02-27 DIAGNOSIS — E871 Hypo-osmolality and hyponatremia: Secondary | ICD-10-CM | POA: Diagnosis not present

## 2021-02-27 LAB — RESP PANEL BY RT-PCR (FLU A&B, COVID) ARPGX2
Influenza A by PCR: NEGATIVE
Influenza B by PCR: NEGATIVE
SARS Coronavirus 2 by RT PCR: NEGATIVE

## 2021-02-27 LAB — TROPONIN I (HIGH SENSITIVITY): Troponin I (High Sensitivity): 5 ng/L (ref <18)

## 2021-02-27 MED ORDER — LORAZEPAM 1 MG PO TABS
1.0000 mg | ORAL_TABLET | Freq: Once | ORAL | Status: AC
  Administered 2021-02-27: 1 mg via ORAL
  Filled 2021-02-27: qty 1

## 2021-02-27 NOTE — Progress Notes (Signed)
Called by ED Physician for second opinion on MRI, which was read as exhibiting a punctate acute right medial cerebellar ischemic infarction.   The images have been personally reviewed. The punctate focus of increased signal overlapping the medial edge of the inferior right cerebellar hemisphere on one image shows no correlating hypointensity on the ADC map. The finding is therefore best classifiable as artifactual rather than as a focus of restricted diffusion. Additionally, the finding is in a region prone to artifact (interfaces between tissue and CSF, near bone); there also is no correlate on the coronal DWI images.   Electronically signed: Dr. Kerney Elbe
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
## 2021-02-27 NOTE — Discharge Instructions (Signed)
Limit your water intake to less than 1.5 L for the next couple of days.  Schedule follow-up with your primary doctor in 1 week to have your blood work rechecked.

## 2021-02-28 ENCOUNTER — Observation Stay (HOSPITAL_COMMUNITY)
Admission: EM | Admit: 2021-02-28 | Discharge: 2021-03-01 | Disposition: A | Discharge status: Home / Self Care | Payer: Medicare Other | Attending: Internal Medicine | Admitting: Internal Medicine

## 2021-02-28 ENCOUNTER — Emergency Department (HOSPITAL_COMMUNITY): Payer: Medicare Other

## 2021-02-28 ENCOUNTER — Other Ambulatory Visit: Payer: Self-pay

## 2021-02-28 ENCOUNTER — Encounter (HOSPITAL_COMMUNITY): Payer: Self-pay | Admitting: Internal Medicine

## 2021-02-28 DIAGNOSIS — G459 Transient cerebral ischemic attack, unspecified: Secondary | ICD-10-CM | POA: Diagnosis not present

## 2021-02-28 DIAGNOSIS — Z91040 Latex allergy status: Secondary | ICD-10-CM | POA: Diagnosis not present

## 2021-02-28 DIAGNOSIS — R42 Dizziness and giddiness: Principal | ICD-10-CM

## 2021-02-28 DIAGNOSIS — N1831 Chronic kidney disease, stage 3a: Secondary | ICD-10-CM | POA: Diagnosis not present

## 2021-02-28 DIAGNOSIS — R079 Chest pain, unspecified: Secondary | ICD-10-CM | POA: Diagnosis not present

## 2021-02-28 DIAGNOSIS — I2510 Atherosclerotic heart disease of native coronary artery without angina pectoris: Secondary | ICD-10-CM | POA: Diagnosis not present

## 2021-02-28 DIAGNOSIS — Z79899 Other long term (current) drug therapy: Secondary | ICD-10-CM | POA: Diagnosis not present

## 2021-02-28 DIAGNOSIS — H409 Unspecified glaucoma: Secondary | ICD-10-CM | POA: Diagnosis present

## 2021-02-28 DIAGNOSIS — R410 Disorientation, unspecified: Secondary | ICD-10-CM | POA: Diagnosis not present

## 2021-02-28 DIAGNOSIS — I10 Essential (primary) hypertension: Secondary | ICD-10-CM | POA: Diagnosis present

## 2021-02-28 DIAGNOSIS — R0789 Other chest pain: Secondary | ICD-10-CM | POA: Diagnosis not present

## 2021-02-28 DIAGNOSIS — Z85828 Personal history of other malignant neoplasm of skin: Secondary | ICD-10-CM | POA: Diagnosis not present

## 2021-02-28 DIAGNOSIS — E871 Hypo-osmolality and hyponatremia: Secondary | ICD-10-CM | POA: Diagnosis not present

## 2021-02-28 DIAGNOSIS — R531 Weakness: Secondary | ICD-10-CM

## 2021-02-28 DIAGNOSIS — R9082 White matter disease, unspecified: Secondary | ICD-10-CM | POA: Insufficient documentation

## 2021-02-28 DIAGNOSIS — T699XXA Effect of reduced temperature, unspecified, initial encounter: Secondary | ICD-10-CM | POA: Diagnosis not present

## 2021-02-28 DIAGNOSIS — I129 Hypertensive chronic kidney disease with stage 1 through stage 4 chronic kidney disease, or unspecified chronic kidney disease: Secondary | ICD-10-CM | POA: Insufficient documentation

## 2021-02-28 DIAGNOSIS — Z91010 Allergy to peanuts: Secondary | ICD-10-CM | POA: Diagnosis not present

## 2021-02-28 DIAGNOSIS — E876 Hypokalemia: Secondary | ICD-10-CM | POA: Diagnosis present

## 2021-02-28 LAB — COMPREHENSIVE METABOLIC PANEL
ALT: 16 U/L (ref 0–44)
AST: 27 U/L (ref 15–41)
Albumin: 3.8 g/dL (ref 3.5–5.0)
Alkaline Phosphatase: 71 U/L (ref 38–126)
Anion gap: 11 (ref 5–15)
BUN: 20 mg/dL (ref 8–23)
CO2: 24 mmol/L (ref 22–32)
Calcium: 9.3 mg/dL (ref 8.9–10.3)
Chloride: 93 mmol/L — ABNORMAL LOW (ref 98–111)
Creatinine, Ser: 1.12 mg/dL — ABNORMAL HIGH (ref 0.44–1.00)
GFR, Estimated: 51 mL/min — ABNORMAL LOW (ref >60)
Glucose, Bld: 103 mg/dL — ABNORMAL HIGH (ref 70–99)
Potassium: 3.5 mmol/L (ref 3.5–5.1)
Sodium: 128 mmol/L — ABNORMAL LOW (ref 135–145)
Total Bilirubin: 0.7 mg/dL (ref 0.3–1.2)
Total Protein: 7.1 g/dL (ref 6.5–8.1)

## 2021-02-28 LAB — URINALYSIS, ROUTINE W REFLEX MICROSCOPIC
Bilirubin Urine: NEGATIVE
Glucose, UA: NEGATIVE mg/dL
Hgb urine dipstick: NEGATIVE
Ketones, ur: NEGATIVE mg/dL
Nitrite: NEGATIVE
Protein, ur: NEGATIVE mg/dL
Specific Gravity, Urine: 1.004 — ABNORMAL LOW (ref 1.005–1.030)
pH: 6 (ref 5.0–8.0)

## 2021-02-28 LAB — CBC WITH DIFFERENTIAL/PLATELET
Abs Immature Granulocytes: 0.02 10*3/uL (ref 0.00–0.07)
Basophils Absolute: 0 10*3/uL (ref 0.0–0.1)
Basophils Relative: 0 %
Eosinophils Absolute: 0.1 10*3/uL (ref 0.0–0.5)
Eosinophils Relative: 1 %
HCT: 38.6 % (ref 36.0–46.0)
Hemoglobin: 13 g/dL (ref 12.0–15.0)
Immature Granulocytes: 0 %
Lymphocytes Relative: 23 %
Lymphs Abs: 2.3 10*3/uL (ref 0.7–4.0)
MCH: 30.2 pg (ref 26.0–34.0)
MCHC: 33.7 g/dL (ref 30.0–36.0)
MCV: 89.8 fL (ref 80.0–100.0)
Monocytes Absolute: 1 10*3/uL (ref 0.1–1.0)
Monocytes Relative: 10 %
Neutro Abs: 6.6 10*3/uL (ref 1.7–7.7)
Neutrophils Relative %: 66 %
Platelets: 235 10*3/uL (ref 150–400)
RBC: 4.3 MIL/uL (ref 3.87–5.11)
RDW: 13.7 % (ref 11.5–15.5)
WBC: 10 10*3/uL (ref 4.0–10.5)
nRBC: 0 % (ref 0.0–0.2)

## 2021-02-28 LAB — CBG MONITORING, ED: Glucose-Capillary: 112 mg/dL — ABNORMAL HIGH (ref 70–99)

## 2021-02-28 LAB — MAGNESIUM: Magnesium: 1.9 mg/dL (ref 1.7–2.4)

## 2021-02-28 MED ORDER — LATANOPROST 0.005 % OP SOLN
1.0000 [drp] | Freq: Every day | OPHTHALMIC | Status: DC
  Filled 2021-02-28: qty 2.5

## 2021-02-28 MED ORDER — LORAZEPAM 2 MG/ML IJ SOLN
1.0000 mg | Freq: Once | INTRAMUSCULAR | Status: AC
  Administered 2021-03-01: 1 mg via INTRAVENOUS
  Filled 2021-02-28: qty 1

## 2021-02-28 MED ORDER — ACETAMINOPHEN 325 MG PO TABS: 650.0000 mg | ORAL_TABLET | ORAL | Status: DC | PRN

## 2021-02-28 MED ORDER — FLUOROURACIL 0.5 % EX CREA: 1.0000 "application " | TOPICAL_CREAM | Freq: Every day | CUTANEOUS | Status: DC | PRN

## 2021-02-28 MED ORDER — ASPIRIN EC 81 MG PO TBEC
81.0000 mg | DELAYED_RELEASE_TABLET | Freq: Every day | ORAL | Status: DC
  Filled 2021-02-28: qty 1

## 2021-02-28 MED ORDER — ENOXAPARIN SODIUM 30 MG/0.3ML IJ SOSY: 30.0000 mg | PREFILLED_SYRINGE | INTRAMUSCULAR | Status: DC

## 2021-02-28 MED ORDER — TRETINOIN 0.05 % EX CREA: 1.0000 "application " | TOPICAL_CREAM | Freq: Every day | CUTANEOUS | Status: DC

## 2021-02-28 MED ORDER — ACETAMINOPHEN 650 MG RE SUPP: 650.0000 mg | RECTAL | Status: DC | PRN

## 2021-02-28 MED ORDER — TAFLUPROST (PF) 0.0015 % OP SOLN: 1.0000 [drp] | Freq: Every day | OPHTHALMIC | Status: DC

## 2021-02-28 MED ORDER — SENNOSIDES-DOCUSATE SODIUM 8.6-50 MG PO TABS: 1.0000 | ORAL_TABLET | Freq: Every evening | ORAL | Status: DC | PRN

## 2021-02-28 MED ORDER — NETARSUDIL-LATANOPROST 0.02-0.005 % OP SOLN: 1.0000 [drp] | Freq: Every day | OPHTHALMIC | Status: DC

## 2021-02-28 MED ORDER — ACETAMINOPHEN 160 MG/5ML PO SOLN: 650.0000 mg | ORAL | Status: DC | PRN

## 2021-02-28 MED ORDER — STROKE: EARLY STAGES OF RECOVERY BOOK
Freq: Once | Status: AC
  Filled 2021-02-28: qty 1

## 2021-02-28 MED ORDER — SODIUM CHLORIDE 0.9 % IV SOLN: INTRAVENOUS | Status: DC

## 2021-02-28 MED ORDER — IRBESARTAN 75 MG PO TABS
75.0000 mg | ORAL_TABLET | Freq: Every day | ORAL | Status: DC
  Administered 2021-02-28: 75 mg via ORAL
  Filled 2021-02-28 (×3): qty 1

## 2021-02-28 NOTE — H&P (Signed)
History and Physical    Karla Richards IRC:[id number] DOB: 05/17/1946 DOA: 02/28/2021  PCP: Lucianne Lei, MD Consultants:  Clydene Fake - ophthalmology Patient coming from:  Home - lives with husband; NOK: Husband, [phone]  Chief Complaint: Weakness, AMS  HPI: Karla Richards is a 74 y.o. female with medical history significant of CAD; HTN; glaucoma; and OSA presenting with weakness, AMS. She came to the ER on 7/14 for confusion; MRI was performed and lots of tests and determined her Na++ was low, no IV.  She had been drinking a lot of water and this was thought to be related to drinking too much.  She wasn't feeling well today and called 911 and they brought her in.  Today, she did not feel well. She felt ok at breakfast and noticed a dizzy spell.  She felt weakness in her arms and chest pain (later, while in the ambulance).  Right now, she feels pretty good but is noticing some slow processing, mild confusion.  She had a "heat stroke" a week ago at a baseball game in [place].  She was in the shade, had liquids and an ice pack and then slept the rest of the day.  She was not seen by a doctor for this issue.  She went back to a baseball game the next time and has been feeling weak and listless since then.  They have not noticed memory concerns or confusion from prior to a week ago.  She did have PT in the past for dizziness and "it worked very well but I had to keep doing my exercises and I haven't done my exercises at all this week" because she wasn't feeling well.  She feels dizzy when she moves her head in the ER.    ED Course: Here 2 days ago with weakness.  MRI unremarkable.  Na++ 121 then, discharged.  Continues to feel weak, confused, Na++ improved.  Creatinine better.  Review of Systems: As per HPI; otherwise review of systems reviewed and negative.   Ambulatory Status:  Ambulates without assistance  COVID Vaccine Status:   Complete plus 2 boosters  Past  Medical History:  Diagnosis Date   Arthritis    hands, but reports that she is still active    Cancer ([place]) 2019   skin cancer   Complication of anesthesia    states 45 yrs. ago she was given "Seconal" for childbirth  & she was passed out & felt like she was on fire   Coronary artery disease    Genital herpes    Glaucoma    bilateral    Hearing difficulty    bilateral, Left worse than R , no aids yet    Hypertension    Sleep apnea    tested negative but wakes up gasping for air in her sleep; does not qualify for CPAP    Past Surgical History:  Procedure Laterality Date   CESAREAN SECTION  1970, '72, '79   coronary catherization     MOUTH SURGERY     TRABECULECTOMY Left 03/26/2015   Procedure: TRABECULECTOMY WITH [facility] ON THE LEFT EYE;  Surgeon: Marylynn Pearson, MD;  Location: [place];  Service: Ophthalmology;  Laterality: Left;   TUBAL LIGATION      Social History   Socioeconomic History   Marital status: Married    Spouse name: Not on file   Number of children: Not on file   Years of education: Not on file   Highest education level: Not  on file  Occupational History   Occupation: owns a [employer]  Tobacco Use   Smoking status: Never   Smokeless tobacco: Never  Vaping Use   Vaping Use: Never used  Substance and Sexual Activity   Alcohol use: No   Drug use: No   Sexual activity: Not on file  Other Topics Concern   Not on file  Social History Narrative   Not on file   Social Determinants of Health   Financial Resource Strain: Not on file  Food Insecurity: Not on file  Transportation Needs: Not on file  Physical Activity: Not on file  Stress: Not on file  Social Connections: Not on file  Intimate Partner Violence: Not on file    Allergies  Allergen Reactions   Fish Allergy Anaphylaxis   Mango Flavor Anaphylaxis   Other Anaphylaxis    Tree nuts   Peanuts [Peanut Oil] Anaphylaxis   Penicillins Anaphylaxis    Did it involve swelling of the  face/tongue/throat, SOB, or low BP? Yes Did it involve sudden or severe rash/hives, skin peeling, or any reaction on the inside of your mouth or nose? No Did you need to seek medical attention at a hospital or doctor's office? No When did it last happen?    Several Years Ago   If all above answers are "NO", may proceed with cephalosporin use.     Shellfish Allergy Swelling   Seconal [Secobarbital Sodium] Other (See Comments)    Pt. felt like she was on fire, states her Mom told she passed out   Ace Inhibitors Hives   Alcohol-Sulfur [Elemental Sulfur] Other (See Comments)    Sleepy   Aspirin Other (See Comments)    Abdominal irritation   Beta Adrenergic Blockers Hives   Brimonidine Itching and Other (See Comments)    Puffy eyes - Alphagan    Caffeine Other (See Comments)    Jittery, feels nervous   Iodine Solution [Povidone Iodine] Itching and Other (See Comments)    Topical solution caused severe itching   Latex Itching   Milk-Related Compounds Other (See Comments)    Fluid in the ear and dizziness    Motrin [Ibuprofen] Other (See Comments)    Upset stomach   Norvasc [Amlodipine] Hives and Swelling   Sulfa Antibiotics Other (See Comments)    Can take with Benadryl    Family History  Problem Relation Age of Onset   Stroke Maternal Grandmother 73   Heart attack Mother 10   Stomach cancer Sister    Colon cancer Maternal Uncle    Rectal cancer Maternal Uncle    Esophageal cancer Neg Hx     Prior to Admission medications   Medication Sig Start Date End Date Taking? Authorizing Provider  fluorouracil (EFUDEX) 5 % cream Apply 1 application topically 2 (two) times daily.    [provider]  Multiple Vitamin (MULTI-DAY PO) Take by mouth.    [provider]  Tafluprost 0.0015 % SOLN Place 1 drop into both eyes at bedtime.    [provider]  tretinoin (RETIN-A) 0.05 % cream Apply topically. 11/23/19   [provider]   triamterene-hydrochlorothiazide (MAXZIDE-25) 37.5-25 MG tablet Take 1 tablet by mouth daily. 02/20/18   Loura Halt A, NP  valsartan (DIOVAN) 80 MG tablet Take by mouth. 11/21/18   [provider]    Physical Exam: Vitals:   02/28/21 1430 02/28/21 1500 02/28/21 1653 02/28/21 1654  BP: (!) 168/80 (!) 159/86 (!) 160/85 (!) 159/86  Pulse: 66 (!) 59 60 60  Resp: 17  18 18   Temp:   98.3 F (36.8 C) 98.4 F (36.9 C)  TempSrc:    Oral  SpO2: 100% 98% 98% 98%  Weight:      Height:         General:  Appears calm and comfortable and is in NAD Eyes:  PERRL, EOMI, normal lids, iris ENT:  grossly normal hearing, lips & tongue, mmm Neck:  no LAD, masses or thyromegaly Cardiovascular:  RRR, no m/r/g. No LE edema.  Respiratory:   CTA bilaterally with no wheezes/rales/rhonchi.  Normal respiratory effort. Abdomen:  soft, NT, ND Skin:  Excoriated lesion on R nasal bridge and extending onto the R malar region Musculoskeletal:  grossly normal tone BUE/BLE, good ROM, no bony abnormality Psychiatric:  grossly normal mood and affect, speech fluent and appropriate with scant word finding difficulties, AOx3 Neurologic:  CN 2-12 grossly intact, moves all extremities in coordinated fashion    Radiological Exams on Admission: Independently reviewed - see discussion in A/P where applicable  DG Chest 2 View  Result Date: 02/26/2021 CLINICAL DATA:  Chest pain EXAM: CHEST - 2 VIEW COMPARISON:  05/02/2019 FINDINGS: Cardiac shadow is within normal limits. The lungs are well aerated bilaterally. No focal infiltrate or effusion is seen. Mild degenerative changes of the thoracic spine are noted. IMPRESSION: No active cardiopulmonary disease. Electronically Signed   By: Inez Catalina M.D.   On: 02/26/2021 22:18   CT Head Wo Contrast  Result Date: 02/28/2021 CLINICAL DATA:  Transient ischemic attack EXAM: CT HEAD WITHOUT CONTRAST TECHNIQUE: Contiguous axial images were obtained from the base of the skull  through the vertex without intravenous contrast. COMPARISON:  None. FINDINGS: Brain: No evidence of acute infarction, hemorrhage, hydrocephalus, extra-axial collection or mass lesion/mass effect. Vascular: No hyperdense vessel or unexpected calcification. Skull: Normal. Negative for fracture or focal lesion. Sinuses/Orbits: No acute finding. Other: None. IMPRESSION: Negative head CT Electronically Signed   By: Jacqulynn Cadet M.D.   On: 02/28/2021 12:25   MR BRAIN WO CONTRAST  Result Date: 02/27/2021 CLINICAL DATA:  Initial evaluation for acute dizziness. EXAM: MRI HEAD WITHOUT CONTRAST TECHNIQUE: Multiplanar, multiecho pulse sequences of the brain and surrounding structures were obtained without intravenous contrast. COMPARISON:  Prior MRI from 07/25/2020. FINDINGS: Brain: Cerebral volume within normal limits for age. Scattered patchy T2/FLAIR hyperintensity seen within the periventricular and deep white matter both cerebral hemispheres, most consistent with chronic small vessel ischemic disease, mild in nature. Superimposed small remote lacunar infarct present the left thalamus. There is an apparent 4 mm focus of diffusion signal abnormality involving the inferomedial right cerebellar hemisphere (series 9, image 54). While this finding may be artifactual, a possible punctate acute ischemic infarct is difficult to exclude. Finding is difficult to see on corresponding ADC map given small size. No associated hemorrhage or mass effect. No other diffusion abnormality to suggest acute or subacute ischemia. Gray-white matter differentiation otherwise maintained. No encephalomalacia to suggest chronic cortical infarction elsewhere within the brain. No other foci of susceptibility artifact to suggest acute or chronic intracranial hemorrhage. No mass lesion, midline shift or mass effect. Ventricles normal size without hydrocephalus. No extra-axial fluid collection. Pituitary gland and suprasellar region within normal  limits. Midline structures intact. Vascular: Major intracranial vascular flow voids are maintained. Skull and upper cervical spine: Craniocervical junction within normal limits. Multilevel degenerative spondylosis noted within the upper cervical spine with associated mild to moderate diffuse spinal stenosis, most pronounced at C3-4.  Bone marrow signal intensity within normal limits. No scalp soft tissue abnormality. Sinuses/Orbits: Patient status post bilateral ocular lens replacement. Globes and orbital soft tissues demonstrate no acute finding. Paranasal sinuses are largely clear. No mastoid effusion. Inner ear structures grossly normal. Other: None. IMPRESSION: 1. 4 mm focus of diffusion signal abnormality involving the inferomedial right cerebellar hemisphere. Given the history of dizziness, finding is suspicious for a possible punctate acute ischemic nonhemorrhagic infarct. 2. No other acute intracranial abnormality. 3. Underlying mild chronic microvascular ischemic disease with small remote lacunar infarct at the left thalamus. 4. Multilevel degenerative spondylosis within the upper cervical spine with resultant mild to moderate diffuse spinal stenosis, most pronounced at C3-4. Electronically Signed   By: Jeannine Boga M.D.   On: 02/27/2021 04:12    EKG: pending   Labs on Admission: I have personally reviewed the available labs and imaging studies at the time of the admission.  Pertinent labs:   Na++ 128; 121 on Mar 04, 2023; 137 in 04/2019 BUN 20/Creatinine 1.12/GFR 51; 8/0.89/>60 on 03/04/2023 - stable from 04/2019 Normal CBC UA: small LE, rare bacteria   Assessment/Plan Principal Problem:   Dizziness Active Problems:   HTN (hypertension)   Glaucoma   Hyponatremia   Stage 3a chronic kidney disease (HCC)   Dizziness -Patient with long-standing periodic dizziness that improves with PT/exercises -She attended a baseball game in the heat last weekend and had a heat reaction (husband refers to  this as a "heat stroke") -Since then, she has felt increasingly weak, dizzy, and intermittently confused -She was seen in the ER on 03-03-14 and had fairly pronounced hyponatremia that was thought to be due to water intoxication and so she was discharged with fluid restriction -Her sodium is improved today but her symptoms are persistent -While in the ER, she also had an MRI with a ditzel in the cerebellum; it was reviewed with neurology who thought it to be a benign finding -Given her persistent symptoms without other explanation, it is reasonable to repeat the MRI to further evaluate (and will add MRA) -Will observe on telemetry overnight -Aspirin has been given to reduce stroke mortality and decrease morbidity; she reports GI upset from ASA so will give EC ASA 81 mg daily -Echo will be needed if MRI is abnormal -Neurology consult will also be needed if MRI is abnormal, but if normal this is not indicated currently -Risk stratification with FLP, A1c -PT/OT/ST/Nutrition Consults  HTN -Out of the window for permissive HTN  -Continue Diovan -Hold Maxzide due to hyponatremia  Hyponatremia -Thought to be due to water intoxication at the time of recent ER evaluation -While she may have benefited from observation from that standpoint at that time, she is improving now and is almost back to her baseline and so this is unlikely to be a factor in her presentation currently  AK -Patient reports recurrent actinic keratosis/SCC along R nasal bridge region and does not this is is concerning for zoster at this time -Would continue 5FU but this is not available in the hospital -Can continue Retin A   Glaucoma -Continue Rocklatan, Zioptan  Stage 3a CKD -Appears to be stable at this time -Recheck BMP in AM      Note: This patient has been tested and is negative for the novel coronavirus COVID-19. She has been fully vaccinated against COVID-19.    DVT prophylaxis:  Lovenox  Code Status: Full -  confirmed with patient/family Family Communication: Husband present throughout evaluation Disposition Plan:  The patient is  from: home  Anticipated d/c is to: home without [facility] services   Anticipated d/c date will depend on clinical response to treatment, but possibly as early as tomorrow if she has excellent response to treatment  Patient is currently: acutely ill Consults called: PT/OT/ST/Nutrition; TOC team Admission status: It is my clinical opinion that referral for OBSERVATION is reasonable and necessary in this patient based on the above information provided. The aforementioned taken together are felt to place the patient at high risk for further clinical deterioration. However it is anticipated that the patient may be medically stable for discharge from the hospital within 24 to 48 hours.      Karmen Bongo MD [facility]   How to contact the [facility] Attending or Consulting provider [place] or covering provider during after hours [place], for this patient?  Check the care team in [id number] [facility] and look for a) attending/consulting [facility] provider listed and b) the [facility] team listed Log into [email or web address] and use [place]'s universal password to access. If you do not have the password, please contact the hospital operator. Locate the [facility] provider you are looking for under [facility] and page to a number that you can be directly reached. If you still have difficulty reaching the provider, please page the [facility] (Director on Call) for the Hospitalists listed on amion for assistance.   02/28/2021, 5:06 PM
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
## 2021-02-28 NOTE — ED Notes (Signed)
Lunch tray ordered 

## 2021-02-28 NOTE — ED Notes (Signed)
Report attempted CN stay RN just got a pt to call later.

## 2021-02-28 NOTE — ED Triage Notes (Signed)
7/14 pt was admitted here for hyponatremia. Woke up today with generalized weakness and feeling confused. Unable to remember events this AM.

## 2021-02-28 NOTE — Progress Notes (Signed)
Patient received to the unit. Patient is alert and oriented x4. Iv in place and running fluid. Skin assessment done with another nurse. Given instructions about call bell and phone. Bed in low position and call bell in reach.

## 2021-03-01 ENCOUNTER — Observation Stay (HOSPITAL_COMMUNITY): Payer: Medicare Other

## 2021-03-01 DIAGNOSIS — G459 Transient cerebral ischemic attack, unspecified: Secondary | ICD-10-CM | POA: Diagnosis not present

## 2021-03-01 DIAGNOSIS — R42 Dizziness and giddiness: Secondary | ICD-10-CM | POA: Diagnosis not present

## 2021-03-01 LAB — BASIC METABOLIC PANEL
Anion gap: 10 (ref 5–15)
BUN: 12 mg/dL (ref 8–23)
CO2: 26 mmol/L (ref 22–32)
Calcium: 9.6 mg/dL (ref 8.9–10.3)
Chloride: 95 mmol/L — ABNORMAL LOW (ref 98–111)
Creatinine, Ser: 0.93 mg/dL (ref 0.44–1.00)
GFR, Estimated: >60 mL/min (ref >60)
Glucose, Bld: 95 mg/dL (ref 70–99)
Potassium: 4 mmol/L (ref 3.5–5.1)
Sodium: 131 mmol/L — ABNORMAL LOW (ref 135–145)

## 2021-03-01 LAB — HEMOGLOBIN A1C
Hgb A1c MFr Bld: 5.4 % (ref 4.8–5.6)
Mean Plasma Glucose: 108.28 mg/dL

## 2021-03-01 LAB — LIPID PANEL
Cholesterol: 184 mg/dL (ref 0–200)
HDL: 64 mg/dL (ref >40)
LDL Cholesterol: 105 mg/dL — ABNORMAL HIGH (ref 0–99)
Total CHOL/HDL Ratio: 2.9 RATIO
Triglycerides: 77 mg/dL (ref <150)
VLDL: 15 mg/dL (ref 0–40)

## 2021-03-01 MED ORDER — LATANOPROST 0.005 % OP SOLN
1.0000 [drp] | Freq: Every day | OPHTHALMIC | Status: DC
  Filled 2021-03-01: qty 2.5

## 2021-03-01 MED ORDER — HYDRALAZINE HCL 10 MG PO TABS: 10.0000 mg | ORAL_TABLET | Freq: Three times a day (TID) | ORAL | 1 refills | Status: DC

## 2021-03-01 MED ORDER — ASPIRIN 81 MG PO TBEC: 81.0000 mg | DELAYED_RELEASE_TABLET | Freq: Every day | ORAL | 11 refills | Status: DC

## 2021-03-01 NOTE — Evaluation (Signed)
Physical Therapy Evaluation Patient Details Name: Karla Richards MRN: [id number] DOB: 11-29-1945 Today's Date: 03/01/2021   History of Present Illness  75 y.o. female presenting to ED 7/16 with dizziness, weakness and AMS. CT and MRI (-). PMHx significant for CAD, HTN, glaucoma and OSA.  Clinical Impression  Pt denies dizziness on PT arrival, BP stable. Vestibular screening performed including assessment of saccades, smooth pursuits, and horizontal and vertical VOR x 1 testing without symptom reproduction. Pt reports having received VOR x 1 exercises with PT in the past with success at addressing dizziness. Pt tolerates transfers and demonstrates swaying during standing, stating this is her baseline. Pt accepts dynamic gait challenges including scanning, stepping around and over obstacles, sudden stops, and backwards walking. Pt demonstrates mild instability during gait activities and one overt LOB, able to self-correct. Pt with deficits in balance and will benefit from continued acute PT services to address dynamic balance activities. Pt may benefit from stair training at next session as she has 7 stairs to negotiate to enter home. SPT recommends outpatient PT to assist in managing dizziness and improve balance.     Follow Up Recommendations Outpatient PT    Equipment Recommendations  None recommended by PT    Recommendations for Other Services       Precautions / Restrictions Precautions Precautions: Fall Restrictions Weight Bearing Restrictions: No      Mobility  Bed Mobility Overal bed mobility: Needs Assistance Bed Mobility: Supine to Sit     Supine to sit: Supervision     General bed mobility comments: received in recliner    Transfers Overall transfer level: Needs assistance Equipment used: None Transfers: Sit to/from Stand Sit to Stand: Min guard         General transfer comment: min G for safety. Pt reports she sways with standing at  baseline.  Ambulation/Gait Ambulation/Gait assistance: Min guard;Supervision Gait Distance (Feet): 500 Feet Assistive device: None Gait Pattern/deviations: Step-through pattern;Wide base of support;Staggering left;Staggering right;Drifts right/left;Decreased stride length (bil LE externally rotated) Gait velocity: reduced Gait velocity interpretation: 1.31 - 2.62 ft/sec, indicative of limited community ambulator General Gait Details: Pt ambulates with step-through pattern and demonstrates mildly unsteady gait. Pt accepts dynamic gait challenges. Pt experiences one over LOB and able to self-correct.  Stairs            Wheelchair Mobility    Modified Rankin (Stroke Patients Only)       Balance Overall balance assessment: Needs assistance Sitting-balance support: Feet supported Sitting balance-Leahy Scale: Good     Standing balance support: During functional activity Standing balance-Leahy Scale: Fair Standing balance comment: Pt stands without external support or UE support but demonstrates swaying, stating that this occurs at baseline                             Pertinent Vitals/Pain Pain Assessment: No/denies pain    Home Living Family/patient expects to be discharged to:: Private residence Living Arrangements: Spouse/significant other Available Help at Discharge: Family;Available 24 hours/day Type of Home: House Home Access: Stairs to enter Entrance Stairs-Rails: Can reach both Entrance Stairs-Number of Steps: 7 Home Layout: Two level;Able to live on main level with bedroom/bathroom Home Equipment: Walker - 4 wheels;Grab bars - tub/shower      Prior Function Level of Independence: Independent         Comments: Pt independent with ADLs and IADLs. Pt does not use AD for mobility.     Hand Dominance  Dominant Hand: Right    Extremity/Trunk Assessment   Upper Extremity Assessment Upper Extremity Assessment: Defer to OT evaluation     Lower Extremity Assessment Lower Extremity Assessment: Generalized weakness    Cervical / Trunk Assessment Cervical / Trunk Assessment: Normal  Communication   Communication: No difficulties  Cognition Arousal/Alertness: Awake/alert Behavior During Therapy: WFL for tasks assessed/performed Overall Cognitive Status: Within Functional Limits for tasks assessed                                 General Comments: A&Ox4 but requires increased time to respond to to therapists questions; reports receiving ativan prior to MRI this a.m., patient lethargic throghout evaluation; difficulty keeping eyes open initially.      General Comments General comments (skin integrity, edema, etc.): VSS on RA.    Exercises     Assessment/Plan    PT Assessment Patient needs continued PT services  PT Problem List Decreased strength;Decreased activity tolerance;Decreased balance;Decreased mobility;Decreased knowledge of precautions       PT Treatment Interventions Gait training;Stair training;Functional mobility training;Therapeutic activities;Therapeutic exercise;Balance training;Patient/family education    PT Goals (Current goals can be found in the Care Plan section)  Acute Rehab PT Goals Patient Stated Goal: to go home PT Goal Formulation: With patient Time For Goal Achievement: 03/15/21 Potential to Achieve Goals: Good    Frequency Min 3X/week   Barriers to discharge        Co-evaluation               AM-PAC PT "6 Clicks" Mobility  Outcome Measure Help needed turning from your back to your side while in a flat bed without using bedrails?: None Help needed moving from lying on your back to sitting on the side of a flat bed without using bedrails?: None Help needed moving to and from a bed to a chair (including a wheelchair)?: A Little Help needed standing up from a chair using your arms (e.g., wheelchair or bedside chair)?: A Little Help needed to walk in hospital  room?: A Little Help needed climbing 3-5 steps with a railing? : A Little 6 Click Score: 20    End of Session Equipment Utilized During Treatment: Gait belt Activity Tolerance: Patient tolerated treatment well Patient left: in chair;with chair alarm set;with nursing/sitter in room;with call bell/phone within reach Nurse Communication: Mobility status PT Visit Diagnosis: Unsteadiness on feet (R26.81);Other abnormalities of gait and mobility (R26.89);Muscle weakness (generalized) (M62.81)    Time: 4784-1282 PT Time Calculation (min) (ACUTE ONLY): 35 min   Charges:   PT Evaluation $PT Eval Low Complexity: 1 Low          Acute Rehab  Pager: [phone]   [employer]win [employer], SPT  03/01/2021, 10:16 AM

## 2021-03-01 NOTE — Evaluation (Signed)
Occupational Therapy Evaluation Patient Details Name: Karla Richards MRN: [id number] DOB: Jul 24, 1946 Today's Date: 03/01/2021    History of Present Illness 75 y.o. female presenting to ED 7/16 with dizziness, weakness and AMS. CT and MRI (-). PMHx significant for CAD, HTN, glaucoma and OSA.   Clinical Impression   PTA patient was living with her spouse in a private residence and was grossly I with ADLs/IADLs without AD. This date patient met after MRI reporting receiving ativan prior to imaging. Patient completed bed mobility with supervision A for safety, short-distance functional mobility, toilet transfer, toileting/hygiene/clothing management and 2/3 grooming tasks standing at sink level with Min guard for steadying. Patient swaying in all directions reporting this is likely due to ativan. MMT grossly 5/5 in BUE. Although patient lethargic, she is A&Ox4 and able to provide PLOF and home set-up with slightly increase time. OT to see patient for 1 more follow-up session prior to d/c from therapy services to ensure safety and independence with self-care tasks and mobility. Reports husband runs a business from home and is able to provide supervision as needed although she feels that she is at baseline aside from the side effects from ativan.     Follow Up Recommendations  No OT follow up    Equipment Recommendations  None recommended by OT    Recommendations for Other Services       Precautions / Restrictions Precautions Precautions: Fall      Mobility Bed Mobility Overal bed mobility: Needs Assistance Bed Mobility: Supine to Sit     Supine to sit: Supervision     General bed mobility comments: Supervision A for safety    Transfers Overall transfer level: Needs assistance Equipment used: 1 person hand held assist Transfers: Sit to/from Stand Sit to Stand: Min guard         General transfer comment: Min guard for safety secondary to swaying.    Balance  Overall balance assessment: Needs assistance Sitting-balance support: Feet supported Sitting balance-Leahy Scale: Good     Standing balance support: Single extremity supported;During functional activity Standing balance-Leahy Scale: Poor Standing balance comment: Patient swaying in all directions requiring Min guard for steadying; no overt LOB; likely secondary to Ativan?                           ADL either performed or assessed with clinical judgement   ADL Overall ADL's : Needs assistance/impaired     Grooming: Min guard;Standing Grooming Details (indicate cue type and reason): 2/3 grooming tasks standing at sink level with Min guard for steadying 2/2 lethargy.                 Toilet Transfer: Designer, fashion/clothing and Hygiene: Min guard       Functional mobility during ADLs: Min guard General ADL Comments: Min guard grossly for steadying secondary to lethargy. Patient swaying in all directions. Reports this is not baseline but likely 2/2 Ativan.     Vision Baseline Vision/History:  (25% vision in L eye) Additional Comments: Vision appears functional; patient able to find ADL items on sink surface; can locate door handle to bathroom and open without difficulty     Perception     Praxis      Pertinent Vitals/Pain Pain Assessment: No/denies pain     Hand Dominance Right   Extremity/Trunk Assessment Upper Extremity Assessment Upper Extremity Assessment: Overall WFL for tasks assessed   Lower Extremity Assessment Lower  Extremity Assessment: Defer to PT evaluation   Cervical / Trunk Assessment Cervical / Trunk Assessment: Normal   Communication Communication Communication: No difficulties   Cognition Arousal/Alertness: Lethargic Behavior During Therapy: WFL for tasks assessed/performed Overall Cognitive Status: Within Functional Limits for tasks assessed                                 General Comments:  A&Ox4 but requires increased time to respond to to therapists questions; reports receiving ativan prior to MRI this a.m., patient lethargic throghout evaluation; difficulty keeping eyes open initially.   General Comments  Patient very lethargic throughout assessment. Swaying in all directions with mobility in room.    Exercises     Shoulder Instructions      Home Living Family/patient expects to be discharged to:: Private residence Living Arrangements: Spouse/significant other Available Help at Discharge: Family Type of Home: House Home Access: Stairs to enter [employer] of Steps: 7 Entrance Stairs-Rails: Right;Left Home Layout: Two level;Able to live on main level with bedroom/bathroom Alternate Level Stairs-Number of Steps: Full flight   Bathroom Shower/Tub: Teacher, early years/pre: Standard     Home Equipment: None          Prior Functioning/Environment Level of Independence: Independent        Comments: I with ADLs/IADLs without AD,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        OT Problem List: Impaired balance (sitting and/or standing)      OT Treatment/Interventions: Self-care/ADL training;Therapeutic exercise;Therapeutic activities;Balance training;Patient/family education    OT Goals(Current goals can be found in the care plan section) Acute Rehab OT Goals Patient Stated Goal: To return home. OT Goal Formulation: With patient Time For Goal Achievement: 03/15/21 Potential to Achieve Goals: Good ADL Goals Pt Will Perform Grooming: Independently;standing Pt Will Perform Upper Body Dressing: Independently Pt Will Perform Lower Body Dressing: Independently;sit to/from stand Pt Will Transfer to Toilet: Independently;ambulating Pt Will Perform Toileting - Clothing Manipulation and hygiene: Independently;sit to/from stand  OT  Frequency: Min 2X/week   Barriers to D/C:            Co-evaluation              AM-PAC OT "6 Clicks" Daily Activity     Outcome Measure Help from another person eating meals?: None Help from another person taking care of personal grooming?: A Little Help from another person toileting, which includes using toliet, bedpan, or urinal?: A Little Help from another person bathing (including washing, rinsing, drying)?: A Little Help from another person to put on and taking off regular upper body clothing?: A Little Help from another person to put on and taking off regular lower body clothing?: A Little 6 Click Score: 19   End of Session Equipment Utilized During Treatment: Gait belt Nurse Communication: Mobility status  Activity Tolerance: Patient tolerated treatment well;Patient limited by lethargy Patient left: in chair;with call bell/phone within reach;with chair alarm set  OT Visit Diagnosis: Unsteadiness on feet (R26.81)                Time: 0254-2706 OT Time Calculation (min): 24 min Charges:  OT General Charges $OT Visit: 1 Visit OT Evaluation $OT Eval Low Complexity: 1 Low OT Treatments $Self Care/Home Management : 8-22 mins  Ketara Cavness H. OTR/L Supplemental OT,  Department of rehab services [phone]  Nayan Proch R H. 03/01/2021, 8:31 AM

## 2021-03-01 NOTE — Discharge Summary (Signed)
Physician Discharge Summary  Karla Richards SWH:[id number] DOB: [birth year] DOA: 02/28/2021  PCP: Lucianne Lei, MD  Admit date: 02/28/2021 Discharge date: 03/01/2021  Admitted From: Home  Disposition:  Home   Recommendations for Outpatient Follow-up:  Follow up with PCP in 1-2 weeks Please obtain BMP/CBC in one week Monitor BP. Repeat B-met to follow sodium level to determine if patient can resume Maxzide.   Home Health: None  Discharge Condition: Stable.  CODE STATUS: Full Code Diet recommendation: Regular Diet.   Brief/Interim Summary: 75 year old with past medical history significant for CAD, hypertension, glaucoma, OSA presented with weakness, altered mental status.  Patient initially presented to the ER on 7/14 for confusion, MRI was performed and her sodium level was found to be low.  She had been drinking a lot of water and this was thought to be related to drinking too much free water.  She was not feeling well the day of admission when she called 911. She had a heat stroke a week ago at a baseball game in [place].  She was not seen for a doctor for that issue.  Evaluation in the ED: Sodium improved to 128.  She is admitted for further evaluation.  1-Dizziness: Suspect this was related to hyponatremia, dehydration. She is feeling better today.  Denies dizziness. Repeated MRI negative for acute stroke. Sodium improved to 131.  2-Hypertension: Continue with Diovan. Continue to hold Maxide due to hyponatremia. Discharged on low-dose hydralazine.  3-Hyponatremia: Thought to be due to water intoxication at the time of recent ER visit. I think hyponatremia is probably combination of both dehydration initially and subsequently water intoxication. Sodium has improved with IV fluids to 131. Continue to hold Maxzide.   4-Glaucoma: Continue with eyedrops Rocklatan, Zioptan.   5-stage IIIa CKD: Stable   Discharge Diagnoses:  Principal Problem:   Dizziness Active  Problems:   HTN (hypertension)   Glaucoma   Hyponatremia   Stage 3a chronic kidney disease [facility])    Discharge Instructions  Discharge Instructions     Diet general   Complete by: As directed    Increase activity slowly   Complete by: As directed       Allergies as of 03/01/2021       Reactions   Fish Allergy Anaphylaxis   Mango Flavor Anaphylaxis   Other Anaphylaxis   Tree nuts   Peanuts [peanut Oil] Anaphylaxis   Penicillins Anaphylaxis   Did it involve swelling of the face/tongue/throat, SOB, or low BP? Yes Did it involve sudden or severe rash/hives, skin peeling, or any reaction on the inside of your mouth or nose? No Did you need to seek medical attention at a hospital or doctor's office? No When did it last happen?    Several Years Ago   If all above answers are "NO", may proceed with cephalosporin use.   Shellfish Allergy Swelling   Seconal [secobarbital Sodium] Other (See Comments)   Pt. felt like she was on fire, states her Mom told she passed out   Ace Inhibitors Hives   Alcohol-sulfur [elemental Sulfur] Other (See Comments)   Sleepy   Aspirin Other (See Comments)   Abdominal irritation   Beta Adrenergic Blockers Hives   Brimonidine Itching, Other (See Comments)   Puffy eyes - Alphagan    Caffeine Other (See Comments)   Jittery, feels nervous   Iodine Solution [povidone Iodine] Itching, Other (See Comments)   Topical solution caused severe itching   Latex Itching   Milk-related Compounds Other (See Comments)  Fluid in the ear and dizziness    Motrin [ibuprofen] Other (See Comments)   Upset stomach   Norvasc [amlodipine] Hives, Swelling   Sulfa Antibiotics Other (See Comments)   Can take with Benadryl        Medication List     STOP taking these medications    triamterene-hydrochlorothiazide 37.5-25 MG tablet Commonly known as: MAXZIDE-25       TAKE these medications    fluoruracil 0.5 % cream Commonly known as: CARAC Apply 1  application topically daily as needed (skin cancer flare up).   hydrALAZINE 10 MG tablet Commonly known as: APRESOLINE Take 1 tablet (10 mg total) by mouth 3 (three) times daily.   MULTIVITAMIN ADULTS PO Take 1 tablet by mouth daily.   Rocklatan 0.02-0.005 % Soln Generic drug: Netarsudil-Latanoprost Place 1 drop into the right eye at bedtime.   tretinoin 0.05 % cream Commonly known as: RETIN-A Apply 1 application topically at bedtime.   valsartan 80 MG tablet Commonly known as: DIOVAN Take 80 mg by mouth daily.   Zioptan 0.0015 % Soln Generic drug: Tafluprost (PF) Place 1 drop into the left eye at bedtime.        Allergies  Allergen Reactions   Fish Allergy Anaphylaxis   Mango Flavor Anaphylaxis   Other Anaphylaxis    Tree nuts   Peanuts [Peanut Oil] Anaphylaxis   Penicillins Anaphylaxis    Did it involve swelling of the face/tongue/throat, SOB, or low BP? Yes Did it involve sudden or severe rash/hives, skin peeling, or any reaction on the inside of your mouth or nose? No Did you need to seek medical attention at a hospital or doctor's office? No When did it last happen?    Several Years Ago   If all above answers are "NO", may proceed with cephalosporin use.     Shellfish Allergy Swelling   Seconal [Secobarbital Sodium] Other (See Comments)    Pt. felt like she was on fire, states her Mom told she passed out   Ace Inhibitors Hives   Alcohol-Sulfur [Elemental Sulfur] Other (See Comments)    Sleepy   Aspirin Other (See Comments)    Abdominal irritation   Beta Adrenergic Blockers Hives   Brimonidine Itching and Other (See Comments)    Puffy eyes - Alphagan    Caffeine Other (See Comments)    Jittery, feels nervous   Iodine Solution [Povidone Iodine] Itching and Other (See Comments)    Topical solution caused severe itching   Latex Itching   Milk-Related Compounds Other (See Comments)    Fluid in the ear and dizziness    Motrin [Ibuprofen] Other (See  Comments)    Upset stomach   Norvasc [Amlodipine] Hives and Swelling   Sulfa Antibiotics Other (See Comments)    Can take with Benadryl    Consultations: None   Procedures/Studies: DG Chest 2 View  Result Date: 02/26/2021 CLINICAL DATA:  Chest pain EXAM: CHEST - 2 VIEW COMPARISON:  05/02/2019 FINDINGS: Cardiac shadow is within normal limits. The lungs are well aerated bilaterally. No focal infiltrate or effusion is seen. Mild degenerative changes of the thoracic spine are noted. IMPRESSION: No active cardiopulmonary disease. Electronically Signed   By: Inez Catalina M.D.   On: 02/26/2021 22:18   DG Os Calcis Right  Result Date: 02/09/2021 CLINICAL DATA:  Stepped on glass EXAM: RIGHT OS CALCIS - 2+ VIEW COMPARISON:  None. FINDINGS: Lateral and Haris views obtained. No radiopaque foreign body or soft tissue air. No fracture  or dislocation. Joint spaces appear normal. No erosive change. IMPRESSION: No radiopaque foreign body evident. No fracture or dislocation. No evident arthropathy. Electronically Signed   By: Lowella Grip III M.D.   On: 02/09/2021 14:10   CT Head Wo Contrast  Result Date: 02/28/2021 CLINICAL DATA:  Transient ischemic attack EXAM: CT HEAD WITHOUT CONTRAST TECHNIQUE: Contiguous axial images were obtained from the base of the skull through the vertex without intravenous contrast. COMPARISON:  None. FINDINGS: Brain: No evidence of acute infarction, hemorrhage, hydrocephalus, extra-axial collection or mass lesion/mass effect. Vascular: No hyperdense vessel or unexpected calcification. Skull: Normal. Negative for fracture or focal lesion. Sinuses/Orbits: No acute finding. Other: None. IMPRESSION: Negative head CT Electronically Signed   By: Jacqulynn Cadet M.D.   On: 02/28/2021 12:25   MR ANGIO HEAD WO CONTRAST  Result Date: 03/01/2021 CLINICAL DATA:  75 year old female with dizziness, TIA. Questionable punctate right cerebellar infarct on MRI 2 days ago. EXAM: MRI HEAD  WITHOUT CONTRAST MRA HEAD WITHOUT CONTRAST TECHNIQUE: Multiplanar, multi-echo pulse sequences of the brain and surrounding structures were acquired without intravenous contrast. Angiographic images of the Circle of Willis were acquired using MRA technique without intravenous contrast. COMPARISON:  Brain MRI 02/27/2021 and earlier. FINDINGS: MRI HEAD FINDINGS Brain: No convincing restricted diffusion or evidence of acute infarction today. The right cerebellum PICA territory appears homogeneous today on diffusion. Elsewhere stable gray and white matter signal including patchy and widely scattered bilateral white matter T2 and FLAIR hyperintensity, chronic lacunar infarct of the left thalamus, and additional bilateral basal ganglia and thalamic T2 heterogeneity. Brainstem appears spared. No discrete cortical encephalomalacia. No chronic cerebral blood products. No midline shift, mass effect, evidence of mass lesion, ventriculomegaly, extra-axial collection or acute intracranial hemorrhage. Cervicomedullary junction and pituitary are within normal limits. Vascular: Major intracranial vascular flow voids are stable. Skull and upper cervical spine: Stable visible cervical spine degeneration. Visualized bone marrow signal is within normal limits. Sinuses/Orbits: Stable, negative. Other: Visible internal auditory structures appear normal. Negative visible scalp and face. MRA HEAD FINDINGS Antegrade flow in the posterior circulation. Fairly codominant distal vertebral arteries with no stenosis to the vertebrobasilar junction. Normal right PICA origin. Left PICA origin not completely included today but was visible on a 2014 MRA. Patent basilar artery appears stable without stenosis. SCA and PCA origins are patent. AICA origins are patent. Both posterior communicating arteries are present. Bilateral PCA branches are stable since 2014 and within normal limits. Antegrade flow in both ICA siphons. Bilateral siphon irregularity in  keeping with atherosclerosis is noted. No significant siphon stenosis. There is a small chronic infundibulum near the right anterior genu which is stable since 2014. Bilateral ophthalmic and posterior communicating artery origins appear stable and within normal limits. Patent carotid termini. Normal MCA and ACA origins. Mildly dominant left ACA as before. Diminutive or absent anterior communicating artery. Visible ACA branches appear stable and within normal limits. Bilateral MCA M1 segments appear stable and patent. The right MCA bifurcates early as before. Both MCA bifurcations appear stable without stenosis. Visible bilateral MCA branches appear stable and within normal limits. IMPRESSION: 1. No acute intracranial abnormality. No evidence of right cerebellar infarct today. Moderately advanced supratentorial chronic small vessel disease appears stable. 2. Intracranial MRA is stable since 2014 and largely negative. Evidence of ICA siphon atherosclerosis but no significant stenosis. Electronically Signed   By: Genevie Ann M.D.   On: 03/01/2021 07:10   MR BRAIN WO CONTRAST  Result Date: 03/01/2021 CLINICAL DATA:  75 year old female  with dizziness, TIA. Questionable punctate right cerebellar infarct on MRI 2 days ago. EXAM: MRI HEAD WITHOUT CONTRAST MRA HEAD WITHOUT CONTRAST TECHNIQUE: Multiplanar, multi-echo pulse sequences of the brain and surrounding structures were acquired without intravenous contrast. Angiographic images of the Circle of Willis were acquired using MRA technique without intravenous contrast. COMPARISON:  Brain MRI 02/27/2021 and earlier. FINDINGS: MRI HEAD FINDINGS Brain: No convincing restricted diffusion or evidence of acute infarction today. The right cerebellum PICA territory appears homogeneous today on diffusion. Elsewhere stable gray and white matter signal including patchy and widely scattered bilateral white matter T2 and FLAIR hyperintensity, chronic lacunar infarct of the left  thalamus, and additional bilateral basal ganglia and thalamic T2 heterogeneity. Brainstem appears spared. No discrete cortical encephalomalacia. No chronic cerebral blood products. No midline shift, mass effect, evidence of mass lesion, ventriculomegaly, extra-axial collection or acute intracranial hemorrhage. Cervicomedullary junction and pituitary are within normal limits. Vascular: Major intracranial vascular flow voids are stable. Skull and upper cervical spine: Stable visible cervical spine degeneration. Visualized bone marrow signal is within normal limits. Sinuses/Orbits: Stable, negative. Other: Visible internal auditory structures appear normal. Negative visible scalp and face. MRA HEAD FINDINGS Antegrade flow in the posterior circulation. Fairly codominant distal vertebral arteries with no stenosis to the vertebrobasilar junction. Normal right PICA origin. Left PICA origin not completely included today but was visible on a 2014 MRA. Patent basilar artery appears stable without stenosis. SCA and PCA origins are patent. AICA origins are patent. Both posterior communicating arteries are present. Bilateral PCA branches are stable since 2014 and within normal limits. Antegrade flow in both ICA siphons. Bilateral siphon irregularity in keeping with atherosclerosis is noted. No significant siphon stenosis. There is a small chronic infundibulum near the right anterior genu which is stable since 2014. Bilateral ophthalmic and posterior communicating artery origins appear stable and within normal limits. Patent carotid termini. Normal MCA and ACA origins. Mildly dominant left ACA as before. Diminutive or absent anterior communicating artery. Visible ACA branches appear stable and within normal limits. Bilateral MCA M1 segments appear stable and patent. The right MCA bifurcates early as before. Both MCA bifurcations appear stable without stenosis. Visible bilateral MCA branches appear stable and within normal limits.  IMPRESSION: 1. No acute intracranial abnormality. No evidence of right cerebellar infarct today. Moderately advanced supratentorial chronic small vessel disease appears stable. 2. Intracranial MRA is stable since 2014 and largely negative. Evidence of ICA siphon atherosclerosis but no significant stenosis. Electronically Signed   By: Genevie Ann M.D.   On: 03/01/2021 07:10   MR BRAIN WO CONTRAST  Result Date: 02/27/2021 CLINICAL DATA:  Initial evaluation for acute dizziness. EXAM: MRI HEAD WITHOUT CONTRAST TECHNIQUE: Multiplanar, multiecho pulse sequences of the brain and surrounding structures were obtained without intravenous contrast. COMPARISON:  Prior MRI from 07/25/2020. FINDINGS: Brain: Cerebral volume within normal limits for age. Scattered patchy T2/FLAIR hyperintensity seen within the periventricular and deep white matter both cerebral hemispheres, most consistent with chronic small vessel ischemic disease, mild in nature. Superimposed small remote lacunar infarct present the left thalamus. There is an apparent 4 mm focus of diffusion signal abnormality involving the inferomedial right cerebellar hemisphere (series 9, image 54). While this finding may be artifactual, a possible punctate acute ischemic infarct is difficult to exclude. Finding is difficult to see on corresponding ADC map given small size. No associated hemorrhage or mass effect. No other diffusion abnormality to suggest acute or subacute ischemia. Gray-white matter differentiation otherwise maintained. No encephalomalacia to suggest chronic  cortical infarction elsewhere within the brain. No other foci of susceptibility artifact to suggest acute or chronic intracranial hemorrhage. No mass lesion, midline shift or mass effect. Ventricles normal size without hydrocephalus. No extra-axial fluid collection. Pituitary gland and suprasellar region within normal limits. Midline structures intact. Vascular: Major intracranial vascular flow voids are  maintained. Skull and upper cervical spine: Craniocervical junction within normal limits. Multilevel degenerative spondylosis noted within the upper cervical spine with associated mild to moderate diffuse spinal stenosis, most pronounced at C3-4. Bone marrow signal intensity within normal limits. No scalp soft tissue abnormality. Sinuses/Orbits: Patient status post bilateral ocular lens replacement. Globes and orbital soft tissues demonstrate no acute finding. Paranasal sinuses are largely clear. No mastoid effusion. Inner ear structures grossly normal. Other: None. IMPRESSION: 1. 4 mm focus of diffusion signal abnormality involving the inferomedial right cerebellar hemisphere. Given the history of dizziness, finding is suspicious for a possible punctate acute ischemic nonhemorrhagic infarct. 2. No other acute intracranial abnormality. 3. Underlying mild chronic microvascular ischemic disease with small remote lacunar infarct at the left thalamus. 4. Multilevel degenerative spondylosis within the upper cervical spine with resultant mild to moderate diffuse spinal stenosis, most pronounced at C3-4. Electronically Signed   By: Jeannine Boga M.D.   On: 02/27/2021 04:12     Subjective: She is sleepy from Ativan but overall feels well.  Denies dizziness.  She was able to ambulate with PT.  Discharge Exam: Vitals:   03/01/21 0028 03/01/21 0448  BP: (!) 151/78 (!) 151/77  Pulse: 62 62  Resp: 18 18  Temp: 97.8 F (36.6 C) 97.7 F (36.5 C)  SpO2: 95% 99%     General: Pt is alert, awake, not in acute distress Cardiovascular: RRR, S1/S2 +, no rubs, no gallops Respiratory: CTA bilaterally, no wheezing, no rhonchi Abdominal: Soft, NT, ND, bowel sounds + Extremities: no edema, no cyanosis    The results of significant diagnostics from this hospitalization (including imaging, microbiology, ancillary and laboratory) are listed below for reference.     Microbiology: Recent Results (from the past  240 hour(s))  Resp Panel by RT-PCR (Flu A&B, Covid) Nasopharyngeal Swab     Status: None   Collection Time: 02/27/21 12:22 AM   Specimen: Nasopharyngeal Swab; Nasopharyngeal(NP) swabs in vial transport medium  Result Value Ref Range Status   SARS Coronavirus 2 by RT PCR NEGATIVE NEGATIVE Final    Comment: (NOTE) SARS-CoV-2 target nucleic acids are NOT DETECTED.  The SARS-CoV-2 RNA is generally detectable in upper respiratory specimens during the acute phase of infection. The lowest concentration of SARS-CoV-2 viral copies this assay can detect is 138 copies/mL. A negative result does not preclude SARS-Cov-2 infection and should not be used as the sole basis for treatment or other patient management decisions. A negative result may occur with  improper specimen collection/handling, submission of specimen other than nasopharyngeal swab, presence of viral mutation(s) within the areas targeted by this assay, and inadequate number of viral copies(<138 copies/mL). A negative result must be combined with clinical observations, patient history, and epidemiological information. The expected result is Negative.  Fact Sheet for Patients:  EntrepreneurPulse.com.au  Fact Sheet for Healthcare Providers:  IncredibleEmployment.be  This test is no t yet approved or cleared by the [place] FDA and  has been authorized for detection and/or diagnosis of SARS-CoV-2 by FDA under an Emergency Use Authorization (EUA). This EUA will remain  in effect (meaning this test can be used) for the duration of the COVID-19 declaration under Section  564(b)(1) of the Act, 21 U.S.C.section 360bbb-3(b)(1), unless the authorization is terminated  or revoked sooner.       Influenza A by PCR NEGATIVE NEGATIVE Final   Influenza B by PCR NEGATIVE NEGATIVE Final    Comment: (NOTE) The Xpert Xpress SARS-CoV-2/FLU/RSV plus assay is intended as an aid in the diagnosis of influenza  from Nasopharyngeal swab specimens and should not be used as a sole basis for treatment. Nasal washings and aspirates are unacceptable for Xpert Xpress SARS-CoV-2/FLU/RSV testing.  Fact Sheet for Patients: EntrepreneurPulse.com.au  Fact Sheet for Healthcare Providers: IncredibleEmployment.be  This test is not yet approved or cleared by the [place] FDA and has been authorized for detection and/or diagnosis of SARS-CoV-2 by FDA under an Emergency Use Authorization (EUA). This EUA will remain in effect (meaning this test can be used) for the duration of the COVID-19 declaration under Section 564(b)(1) of the Act, 21 U.S.C. section 360bbb-3(b)(1), unless the authorization is terminated or revoked.  Performed at [facility], [place] [address], [place], [place] [address]      Labs: BNP (last 3 results) No results for input(s): BNP in the last 8760 hours. Basic Metabolic Panel: Recent Labs  Lab 02/26/21 2127 02/28/21 1147 03/01/21 0412  NA 121* 128* 131*  K 3.3* 3.5 4.0  CL 84* 93* 95*  CO2 $Re'26 24 26  'BrL$ GLUCOSE 105* 103* 95  BUN $Re'8 20 12  'uAi$ CREATININE 0.89 1.12* 0.93  CALCIUM 9.4 9.3 9.6  MG  --  1.9  --    Liver Function Tests: Recent Labs  Lab 02/26/21 2127 02/28/21 1147  AST 20 27  ALT 15 16  ALKPHOS 74 71  BILITOT 0.5 0.7  PROT 7.4 7.1  ALBUMIN 4.1 3.8   No results for input(s): LIPASE, AMYLASE in the last 168 hours. No results for input(s): AMMONIA in the last 168 hours. CBC: Recent Labs  Lab 02/26/21 2126 02/28/21 1147  WBC 6.4 10.0  NEUTROABS 4.0 6.6  HGB 12.7 13.0  HCT 37.4 38.6  MCV 88.4 89.8  PLT 220 235   Cardiac Enzymes: No results for input(s): CKTOTAL, CKMB, CKMBINDEX, TROPONINI in the last 168 hours. BNP: Invalid input(s): POCBNP CBG: Recent Labs  Lab 02/28/21 1157  GLUCAP 112*   D-Dimer No results for input(s): DDIMER in the last 72 hours. Hgb A1c Recent Labs    03/01/21 0412  HGBA1C  5.4   Lipid Profile Recent Labs    03/01/21 0412  CHOL 184  HDL 64  LDLCALC 105*  TRIG 77  CHOLHDL 2.9   Thyroid function studies No results for input(s): TSH, T4TOTAL, T3FREE, THYROIDAB in the last 72 hours.  Invalid input(s): FREET3 Anemia work up No results for input(s): VITAMINB12, FOLATE, FERRITIN, TIBC, IRON, RETICCTPCT in the last 72 hours. Urinalysis    Component Value Date/Time   COLORURINE STRAW (A) 02/28/2021 1147   APPEARANCEUR CLEAR 02/28/2021 1147   LABSPEC 1.004 (L) 02/28/2021 1147   PHURINE 6.0 02/28/2021 1147   GLUCOSEU NEGATIVE 02/28/2021 1147   HGBUR NEGATIVE 02/28/2021 1147   BILIRUBINUR NEGATIVE 02/28/2021 1147   KETONESUR NEGATIVE 02/28/2021 1147   PROTEINUR NEGATIVE 02/28/2021 1147   UROBILINOGEN 0.2 01/14/2017 1814   NITRITE NEGATIVE 02/28/2021 1147   LEUKOCYTESUR SMALL (A) 02/28/2021 1147   Sepsis Labs Invalid input(s): PROCALCITONIN,  WBC,  LACTICIDVEN Microbiology Recent Results (from the past 240 hour(s))  Resp Panel by RT-PCR (Flu A&B, Covid) Nasopharyngeal Swab     Status: None   Collection Time:  02/27/21 12:22 AM   Specimen: Nasopharyngeal Swab; Nasopharyngeal(NP) swabs in vial transport medium  Result Value Ref Range Status   SARS Coronavirus 2 by RT PCR NEGATIVE NEGATIVE Final    Comment: (NOTE) SARS-CoV-2 target nucleic acids are NOT DETECTED.  The SARS-CoV-2 RNA is generally detectable in upper respiratory specimens during the acute phase of infection. The lowest concentration of SARS-CoV-2 viral copies this assay can detect is 138 copies/mL. A negative result does not preclude SARS-Cov-2 infection and should not be used as the sole basis for treatment or other patient management decisions. A negative result may occur with  improper specimen collection/handling, submission of specimen other than nasopharyngeal swab, presence of viral mutation(s) within the areas targeted by this assay, and inadequate number of viral copies(<138  copies/mL). A negative result must be combined with clinical observations, patient history, and epidemiological information. The expected result is Negative.  Fact Sheet for Patients:  EntrepreneurPulse.com.au  Fact Sheet for Healthcare Providers:  IncredibleEmployment.be  This test is no t yet approved or cleared by the [place] FDA and  has been authorized for detection and/or diagnosis of SARS-CoV-2 by FDA under an Emergency Use Authorization (EUA). This EUA will remain  in effect (meaning this test can be used) for the duration of the COVID-19 declaration under Section 564(b)(1) of the Act, 21 U.S.C.section 360bbb-3(b)(1), unless the authorization is terminated  or revoked sooner.       Influenza A by PCR NEGATIVE NEGATIVE Final   Influenza B by PCR NEGATIVE NEGATIVE Final    Comment: (NOTE) The Xpert Xpress SARS-CoV-2/FLU/RSV plus assay is intended as an aid in the diagnosis of influenza from Nasopharyngeal swab specimens and should not be used as a sole basis for treatment. Nasal washings and aspirates are unacceptable for Xpert Xpress SARS-CoV-2/FLU/RSV testing.  Fact Sheet for Patients: EntrepreneurPulse.com.au  Fact Sheet for Healthcare Providers: IncredibleEmployment.be  This test is not yet approved or cleared by the [place] FDA and has been authorized for detection and/or diagnosis of SARS-CoV-2 by FDA under an Emergency Use Authorization (EUA). This EUA will remain in effect (meaning this test can be used) for the duration of the COVID-19 declaration under Section 564(b)(1) of the Act, 21 U.S.C. section 360bbb-3(b)(1), unless the authorization is terminated or revoked.  Performed at [facility], [place] [address], [place], [place] [address]      Time coordinating discharge: 40 minutes  SIGNED:   Elmarie Shiley, MD  [facility]

## 2021-03-02 LAB — URINE CULTURE: Culture: 30000 — AB

## 2021-03-04 DIAGNOSIS — M792 Neuralgia and neuritis, unspecified: Secondary | ICD-10-CM | POA: Diagnosis not present

## 2021-03-04 DIAGNOSIS — M79672 Pain in left foot: Secondary | ICD-10-CM | POA: Diagnosis not present

## 2021-03-04 DIAGNOSIS — L602 Onychogryphosis: Secondary | ICD-10-CM | POA: Diagnosis not present

## 2021-03-04 DIAGNOSIS — S90851A Superficial foreign body, right foot, initial encounter: Secondary | ICD-10-CM | POA: Diagnosis not present

## 2021-03-09 ENCOUNTER — Encounter (HOSPITAL_COMMUNITY): Payer: Self-pay | Admitting: Emergency Medicine

## 2021-03-09 ENCOUNTER — Emergency Department (HOSPITAL_COMMUNITY)
Admission: EM | Admit: 2021-03-09 | Discharge: 2021-03-10 | Disposition: A | Discharge status: Home / Self Care | Payer: Medicare Other | Attending: Emergency Medicine | Admitting: Emergency Medicine

## 2021-03-09 ENCOUNTER — Other Ambulatory Visit: Payer: Self-pay

## 2021-03-09 ENCOUNTER — Emergency Department (HOSPITAL_COMMUNITY): Payer: Medicare Other

## 2021-03-09 DIAGNOSIS — I129 Hypertensive chronic kidney disease with stage 1 through stage 4 chronic kidney disease, or unspecified chronic kidney disease: Secondary | ICD-10-CM | POA: Diagnosis not present

## 2021-03-09 DIAGNOSIS — Z91040 Latex allergy status: Secondary | ICD-10-CM | POA: Diagnosis not present

## 2021-03-09 DIAGNOSIS — Z85828 Personal history of other malignant neoplasm of skin: Secondary | ICD-10-CM | POA: Insufficient documentation

## 2021-03-09 DIAGNOSIS — R030 Elevated blood-pressure reading, without diagnosis of hypertension: Secondary | ICD-10-CM

## 2021-03-09 DIAGNOSIS — Z91010 Allergy to peanuts: Secondary | ICD-10-CM | POA: Insufficient documentation

## 2021-03-09 DIAGNOSIS — M25512 Pain in left shoulder: Secondary | ICD-10-CM | POA: Insufficient documentation

## 2021-03-09 DIAGNOSIS — M130 Polyarthritis, unspecified: Secondary | ICD-10-CM | POA: Diagnosis not present

## 2021-03-09 DIAGNOSIS — I10 Essential (primary) hypertension: Secondary | ICD-10-CM | POA: Diagnosis not present

## 2021-03-09 DIAGNOSIS — R519 Headache, unspecified: Secondary | ICD-10-CM | POA: Diagnosis not present

## 2021-03-09 DIAGNOSIS — R202 Paresthesia of skin: Secondary | ICD-10-CM | POA: Diagnosis not present

## 2021-03-09 DIAGNOSIS — G4489 Other headache syndrome: Secondary | ICD-10-CM | POA: Diagnosis not present

## 2021-03-09 DIAGNOSIS — I639 Cerebral infarction, unspecified: Secondary | ICD-10-CM | POA: Diagnosis not present

## 2021-03-09 DIAGNOSIS — I2510 Atherosclerotic heart disease of native coronary artery without angina pectoris: Secondary | ICD-10-CM | POA: Diagnosis not present

## 2021-03-09 DIAGNOSIS — N1831 Chronic kidney disease, stage 3a: Secondary | ICD-10-CM | POA: Insufficient documentation

## 2021-03-09 DIAGNOSIS — I6782 Cerebral ischemia: Secondary | ICD-10-CM | POA: Diagnosis not present

## 2021-03-09 LAB — COMPREHENSIVE METABOLIC PANEL
ALT: 15 U/L (ref 0–44)
AST: 20 U/L (ref 15–41)
Albumin: 3.8 g/dL (ref 3.5–5.0)
Alkaline Phosphatase: 65 U/L (ref 38–126)
Anion gap: 6 (ref 5–15)
BUN: 7 mg/dL — ABNORMAL LOW (ref 8–23)
CO2: 28 mmol/L (ref 22–32)
Calcium: 9.5 mg/dL (ref 8.9–10.3)
Chloride: 101 mmol/L (ref 98–111)
Creatinine, Ser: 0.94 mg/dL (ref 0.44–1.00)
GFR, Estimated: >60 mL/min (ref >60)
Glucose, Bld: 110 mg/dL — ABNORMAL HIGH (ref 70–99)
Potassium: 3.8 mmol/L (ref 3.5–5.1)
Sodium: 135 mmol/L (ref 135–145)
Total Bilirubin: 0.5 mg/dL (ref 0.3–1.2)
Total Protein: 7.2 g/dL (ref 6.5–8.1)

## 2021-03-09 LAB — CBC
HCT: 36.8 % (ref 36.0–46.0)
Hemoglobin: 12.1 g/dL (ref 12.0–15.0)
MCH: 30.2 pg (ref 26.0–34.0)
MCHC: 32.9 g/dL (ref 30.0–36.0)
MCV: 91.8 fL (ref 80.0–100.0)
Platelets: 268 10*3/uL (ref 150–400)
RBC: 4.01 MIL/uL (ref 3.87–5.11)
RDW: 14.2 % (ref 11.5–15.5)
WBC: 6.5 10*3/uL (ref 4.0–10.5)
nRBC: 0 % (ref 0.0–0.2)

## 2021-03-09 LAB — TROPONIN I (HIGH SENSITIVITY)
Troponin I (High Sensitivity): 6 ng/L (ref <18)
Troponin I (High Sensitivity): 7 ng/L (ref <18)

## 2021-03-09 NOTE — ED Provider Notes (Signed)
Emergency Medicine Provider Triage Evaluation Note  Karla Richards , a 75 y.o. female  was evaluated in triage.  Pt complains of hypertension, was brought in by EMS blood pressure [id number] systolic.  Patient states that she started having a severe headache this morning, right in the front of her head with numbness and tingling into her bilateral hands and feet.  Patient also states that she started having left shoulder pain this morning, does not radiate anywhere, not tender to palpation.  Denies any chest pain or shortness of breath.  Patient was recently admitted for hyponatremia/AMS, states that she was taken off of one of her blood pressure medications however has taken her other medications.  Review of Systems  Positive: New L shoudler pain, HA  Negative: Shortness of breath, chest pain  Physical Exam  BP (!) 214/87 (BP Location: Left Arm)   Pulse 66   Temp 98.9 F (37.2 C) (Oral)   Resp 16   SpO2 97%  Gen:   Awake, no distress   Resp:  Normal effort  MSK:   Moves extremities without difficulty, no tenderness to left shoulder, normal range of motion Other:  Cranial nerves II through XII grossly intact, good strength to upper and lower extremities.  Negative pronator drift.  No facial droop.  Speech normal.  Medical Decision Making  Medically screening exam initiated at 5:59 PM.  Appropriate orders placed.  Airyn McCoy-Williams was informed that the remainder of the evaluation will be completed by another provider, this initial triage assessment does not replace that evaluation, and the importance of remaining in the ED until their evaluation is complete. Will obtain CT imaging due to new headache and hypertension.  We will also obtain troponin due to new shoulder pain and hypertension in elderly female.   Alfredia Client, PA-C 03/09/21 [place], [place], DO 03/09/21 [id number]

## 2021-03-09 NOTE — ED Triage Notes (Signed)
Pt BIB [facility], c/o hypertension, headache and tingling in her hands that has resolved. Recently admitted for hyponatremia and taken off one of her BP medications. A&Ox4. C/o left shoulder pain.

## 2021-03-10 DIAGNOSIS — I10 Essential (primary) hypertension: Secondary | ICD-10-CM | POA: Diagnosis not present

## 2021-03-10 DIAGNOSIS — R202 Paresthesia of skin: Secondary | ICD-10-CM | POA: Diagnosis not present

## 2021-03-10 DIAGNOSIS — I129 Hypertensive chronic kidney disease with stage 1 through stage 4 chronic kidney disease, or unspecified chronic kidney disease: Secondary | ICD-10-CM | POA: Diagnosis not present

## 2021-03-10 MED ORDER — IRBESARTAN 75 MG PO TABS: 75.0000 mg | ORAL_TABLET | Freq: Every day | ORAL | Status: DC

## 2021-03-10 MED ORDER — IRBESARTAN 75 MG PO TABS
75.0000 mg | ORAL_TABLET | Freq: Once | ORAL | Status: AC
  Administered 2021-03-10: 75 mg via ORAL
  Filled 2021-03-10: qty 1

## 2021-03-10 MED ORDER — HYDRALAZINE HCL 25 MG PO TABS
25.0000 mg | ORAL_TABLET | Freq: Once | ORAL | Status: AC
  Administered 2021-03-10: 25 mg via ORAL
  Filled 2021-03-10: qty 1
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
## 2021-03-10 NOTE — Discharge Instructions (Signed)
It was our pleasure to provide your ER care today - we hope that you feel better.  Your blood pressure is high in the ER - continue your blood pressure meds, limit salt intake, and follow up with primary care doctor in the coming week.   Return to ER if worse, new symptoms, new or severe pain, chest pain, trouble breathing, one-side of body numbness/weakness, change in speech/vision, or other concern.

## 2021-03-10 NOTE — ED Notes (Signed)
Pt toileted.

## 2021-03-14 ENCOUNTER — Ambulatory Visit (HOSPITAL_COMMUNITY): Payer: Self-pay

## 2021-03-14 ENCOUNTER — Other Ambulatory Visit: Payer: Self-pay

## 2021-03-15 ENCOUNTER — Encounter (HOSPITAL_COMMUNITY): Payer: Self-pay

## 2021-03-15 ENCOUNTER — Ambulatory Visit (HOSPITAL_COMMUNITY)
Admission: RE | Admit: 2021-03-15 | Discharge: 2021-03-15 | Disposition: A | Discharge status: Home / Self Care | Payer: Medicare Other | Source: Ambulatory Visit | Attending: Student | Admitting: Student

## 2021-03-15 VITALS — BP 181/83 | HR 76 | Temp 98.6°F | Resp 18

## 2021-03-15 DIAGNOSIS — Z9842 Cataract extraction status, left eye: Secondary | ICD-10-CM

## 2021-03-15 DIAGNOSIS — H409 Unspecified glaucoma: Secondary | ICD-10-CM | POA: Diagnosis not present

## 2021-03-15 DIAGNOSIS — Z9849 Cataract extraction status, unspecified eye: Secondary | ICD-10-CM

## 2021-03-15 DIAGNOSIS — H01001 Unspecified blepharitis right upper eyelid: Secondary | ICD-10-CM

## 2021-03-15 DIAGNOSIS — H01004 Unspecified blepharitis left upper eyelid: Secondary | ICD-10-CM | POA: Diagnosis not present

## 2021-03-15 DIAGNOSIS — Z9841 Cataract extraction status, right eye: Secondary | ICD-10-CM | POA: Diagnosis not present

## 2021-03-15 MED ORDER — ERYTHROMYCIN 5 MG/GM OP OINT: TOPICAL_OINTMENT | OPHTHALMIC | 0 refills | Status: AC
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
## 2021-03-15 NOTE — ED Triage Notes (Signed)
Pt reports bilateral redness in eyes x 3-4 days. Reports she has and Ophthalmology Dr with [facility], as she has 25% vision in the left eye due to Glaucoma.

## 2021-03-15 NOTE — Discharge Instructions (Addendum)
-  Erythromycin ointment at bedtime for 7 days in a row. -If your symptoms are not getting better in 2 days, or if they get worse, follow-up with your eye doctor as soon as possible.  This includes new symptoms like vision changes, vision loss, new flashes of light or floaters in your field of vision, worsening eye pain, etc.

## 2021-03-23 DIAGNOSIS — H9193 Unspecified hearing loss, bilateral: Secondary | ICD-10-CM | POA: Diagnosis not present

## 2021-03-23 DIAGNOSIS — I639 Cerebral infarction, unspecified: Secondary | ICD-10-CM | POA: Diagnosis not present

## 2021-03-23 DIAGNOSIS — G2581 Restless legs syndrome: Secondary | ICD-10-CM | POA: Diagnosis not present

## 2021-03-23 DIAGNOSIS — M130 Polyarthritis, unspecified: Secondary | ICD-10-CM | POA: Diagnosis not present

## 2021-03-23 DIAGNOSIS — H8309 Labyrinthitis, unspecified ear: Secondary | ICD-10-CM | POA: Diagnosis not present

## 2021-03-23 DIAGNOSIS — F064 Anxiety disorder due to known physiological condition: Secondary | ICD-10-CM | POA: Diagnosis not present

## 2021-03-23 DIAGNOSIS — I10 Essential (primary) hypertension: Secondary | ICD-10-CM | POA: Diagnosis not present

## 2021-04-02 ENCOUNTER — Ambulatory Visit: Payer: Medicare Other | Admitting: Occupational Therapy

## 2021-04-02 ENCOUNTER — Ambulatory Visit: Payer: Medicare Other | Attending: Family Medicine | Admitting: Speech Pathology

## 2021-04-02 ENCOUNTER — Other Ambulatory Visit: Payer: Self-pay

## 2021-04-02 ENCOUNTER — Encounter: Payer: Self-pay | Admitting: Occupational Therapy

## 2021-04-02 ENCOUNTER — Encounter: Payer: Self-pay | Admitting: Speech Pathology

## 2021-04-02 DIAGNOSIS — I69318 Other symptoms and signs involving cognitive functions following cerebral infarction: Secondary | ICD-10-CM | POA: Insufficient documentation

## 2021-04-02 DIAGNOSIS — R41841 Cognitive communication deficit: Secondary | ICD-10-CM | POA: Diagnosis not present

## 2021-04-02 DIAGNOSIS — R2681 Unsteadiness on feet: Secondary | ICD-10-CM

## 2021-04-02 DIAGNOSIS — R42 Dizziness and giddiness: Secondary | ICD-10-CM | POA: Diagnosis not present

## 2021-04-02 DIAGNOSIS — R262 Difficulty in walking, not elsewhere classified: Secondary | ICD-10-CM | POA: Diagnosis not present

## 2021-04-02 DIAGNOSIS — M6281 Muscle weakness (generalized): Secondary | ICD-10-CM
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
## 2021-04-03 ENCOUNTER — Encounter: Payer: Self-pay | Admitting: Speech Pathology

## 2021-04-03 ENCOUNTER — Encounter: Payer: Self-pay | Admitting: Occupational Therapy

## 2021-04-03 DIAGNOSIS — Z1231 Encounter for screening mammogram for malignant neoplasm of breast: Secondary | ICD-10-CM | POA: Diagnosis not present
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
## 2021-04-08 ENCOUNTER — Other Ambulatory Visit: Payer: Self-pay

## 2021-04-08 ENCOUNTER — Ambulatory Visit: Payer: Medicare Other | Admitting: Physical Therapy

## 2021-04-08 ENCOUNTER — Encounter: Payer: Self-pay | Admitting: Physical Therapy

## 2021-04-08 DIAGNOSIS — R41841 Cognitive communication deficit: Secondary | ICD-10-CM | POA: Diagnosis not present

## 2021-04-08 DIAGNOSIS — R42 Dizziness and giddiness: Secondary | ICD-10-CM | POA: Diagnosis not present

## 2021-04-08 DIAGNOSIS — M6281 Muscle weakness (generalized): Secondary | ICD-10-CM | POA: Diagnosis not present

## 2021-04-08 DIAGNOSIS — I69318 Other symptoms and signs involving cognitive functions following cerebral infarction: Secondary | ICD-10-CM | POA: Diagnosis not present

## 2021-04-08 DIAGNOSIS — R2681 Unsteadiness on feet: Secondary | ICD-10-CM | POA: Diagnosis not present

## 2021-04-08 DIAGNOSIS — R262 Difficulty in walking, not elsewhere classified: Secondary | ICD-10-CM | POA: Diagnosis not present
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
## 2021-04-09 ENCOUNTER — Ambulatory Visit: Payer: Medicare Other | Admitting: Occupational Therapy

## 2021-04-09 ENCOUNTER — Ambulatory Visit: Payer: Medicare Other | Admitting: Physical Therapy

## 2021-04-09 ENCOUNTER — Ambulatory Visit: Payer: Medicare Other

## 2021-04-09 ENCOUNTER — Ambulatory Visit: Payer: Medicare Other | Admitting: Speech Pathology

## 2021-04-15 ENCOUNTER — Encounter: Payer: Self-pay | Admitting: Physical Therapy

## 2021-04-15 ENCOUNTER — Other Ambulatory Visit: Payer: Self-pay

## 2021-04-15 ENCOUNTER — Ambulatory Visit: Payer: Medicare Other | Admitting: Physical Therapy

## 2021-04-15 VITALS — BP 170/92 | HR 68

## 2021-04-15 DIAGNOSIS — R262 Difficulty in walking, not elsewhere classified: Secondary | ICD-10-CM | POA: Diagnosis not present

## 2021-04-15 DIAGNOSIS — I69318 Other symptoms and signs involving cognitive functions following cerebral infarction: Secondary | ICD-10-CM | POA: Diagnosis not present

## 2021-04-15 DIAGNOSIS — R42 Dizziness and giddiness: Secondary | ICD-10-CM | POA: Diagnosis not present

## 2021-04-15 DIAGNOSIS — R41841 Cognitive communication deficit: Secondary | ICD-10-CM | POA: Diagnosis not present

## 2021-04-15 DIAGNOSIS — R2681 Unsteadiness on feet: Secondary | ICD-10-CM | POA: Diagnosis not present

## 2021-04-15 DIAGNOSIS — M6281 Muscle weakness (generalized): Secondary | ICD-10-CM | POA: Diagnosis not present
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
## 2021-04-17 ENCOUNTER — Ambulatory Visit: Payer: Medicare Other | Attending: Family Medicine | Admitting: Physical Therapy

## 2021-04-17 ENCOUNTER — Other Ambulatory Visit: Payer: Self-pay

## 2021-04-17 ENCOUNTER — Encounter: Payer: Self-pay | Admitting: Physical Therapy

## 2021-04-17 DIAGNOSIS — R4701 Aphasia: Secondary | ICD-10-CM | POA: Insufficient documentation

## 2021-04-17 DIAGNOSIS — R42 Dizziness and giddiness: Secondary | ICD-10-CM | POA: Insufficient documentation

## 2021-04-17 DIAGNOSIS — R41841 Cognitive communication deficit: Secondary | ICD-10-CM | POA: Diagnosis not present

## 2021-04-17 DIAGNOSIS — R2681 Unsteadiness on feet: Secondary | ICD-10-CM | POA: Diagnosis not present

## 2021-04-17 DIAGNOSIS — R262 Difficulty in walking, not elsewhere classified: Secondary | ICD-10-CM | POA: Insufficient documentation

## 2021-04-17 DIAGNOSIS — I69318 Other symptoms and signs involving cognitive functions following cerebral infarction: Secondary | ICD-10-CM | POA: Insufficient documentation

## 2021-04-17 DIAGNOSIS — M6281 Muscle weakness (generalized): Secondary | ICD-10-CM | POA: Insufficient documentation

## 2021-04-17 NOTE — Patient Instructions (Signed)
Access Code: [email or web address] URL: [email or web address] Date: 04/17/2021 Prepared by: Mady Haagensen  Exercises Single Leg Stance with Support - 1 x daily - 5 x weekly - 1 sets - 3 reps - 10 sec hold Side Stepping with Counter Support - 1 x daily - 5 x weekly - 1 sets - 5 reps Alternating Step Taps with Counter Support - 1 x daily - 5 x weekly - 1-2 sets - 10 reps Forward Step Up - 1 x daily - 5 x weekly - 1-2 sets - 10 reps
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
## 2021-04-22 ENCOUNTER — Encounter: Payer: Self-pay | Admitting: Physical Therapy

## 2021-04-22 ENCOUNTER — Other Ambulatory Visit: Payer: Self-pay

## 2021-04-22 ENCOUNTER — Ambulatory Visit: Payer: Medicare Other | Admitting: Physical Therapy

## 2021-04-22 DIAGNOSIS — R2681 Unsteadiness on feet: Secondary | ICD-10-CM

## 2021-04-22 DIAGNOSIS — R42 Dizziness and giddiness: Secondary | ICD-10-CM | POA: Diagnosis not present

## 2021-04-22 DIAGNOSIS — R4701 Aphasia: Secondary | ICD-10-CM | POA: Diagnosis not present

## 2021-04-22 DIAGNOSIS — M6281 Muscle weakness (generalized): Secondary | ICD-10-CM | POA: Diagnosis not present

## 2021-04-22 DIAGNOSIS — R262 Difficulty in walking, not elsewhere classified: Secondary | ICD-10-CM

## 2021-04-22 DIAGNOSIS — R41841 Cognitive communication deficit: Secondary | ICD-10-CM | POA: Diagnosis not present

## 2021-04-22 NOTE — Patient Instructions (Signed)
Provided handout for the following balance HEP:  Corner balance exercises standing on pillow with chair in front for support:  1)  Feet apart, EO, head turns/nods x 5 reps  2)  Feet apart, EC, head turns/nods x 5 reps  3)  Feet together, EO, head turns/nods x 5 reps  4)  Feet together, EC, head turns/nods x 5 reps  To be performed once per day
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
## 2021-04-24 ENCOUNTER — Ambulatory Visit: Payer: Medicare Other

## 2021-04-24 DIAGNOSIS — I639 Cerebral infarction, unspecified: Secondary | ICD-10-CM | POA: Diagnosis not present

## 2021-04-24 DIAGNOSIS — H401133 Primary open-angle glaucoma, bilateral, severe stage: Secondary | ICD-10-CM | POA: Diagnosis not present

## 2021-04-24 DIAGNOSIS — F064 Anxiety disorder due to known physiological condition: Secondary | ICD-10-CM | POA: Diagnosis not present

## 2021-04-24 DIAGNOSIS — I10 Essential (primary) hypertension: Secondary | ICD-10-CM | POA: Diagnosis not present

## 2021-04-29 ENCOUNTER — Other Ambulatory Visit: Payer: Self-pay

## 2021-04-29 ENCOUNTER — Ambulatory Visit: Payer: Medicare Other

## 2021-04-29 ENCOUNTER — Ambulatory Visit: Payer: Medicare Other | Admitting: Occupational Therapy

## 2021-04-29 ENCOUNTER — Encounter: Payer: Self-pay | Admitting: Occupational Therapy

## 2021-04-29 VITALS — BP 183/90 | HR 72

## 2021-04-29 VITALS — BP 195/101 | HR 66

## 2021-04-29 DIAGNOSIS — R262 Difficulty in walking, not elsewhere classified: Secondary | ICD-10-CM

## 2021-04-29 DIAGNOSIS — M6281 Muscle weakness (generalized): Secondary | ICD-10-CM | POA: Diagnosis not present

## 2021-04-29 DIAGNOSIS — R42 Dizziness and giddiness: Secondary | ICD-10-CM | POA: Diagnosis not present

## 2021-04-29 DIAGNOSIS — R41841 Cognitive communication deficit: Secondary | ICD-10-CM

## 2021-04-29 DIAGNOSIS — R2681 Unsteadiness on feet: Secondary | ICD-10-CM

## 2021-04-29 DIAGNOSIS — I69318 Other symptoms and signs involving cognitive functions following cerebral infarction: Secondary | ICD-10-CM

## 2021-04-29 DIAGNOSIS — R4701 Aphasia: Secondary | ICD-10-CM | POA: Diagnosis not present

## 2021-04-29 NOTE — Patient Instructions (Signed)
Memory Compensation Strategies  Use "WARM" strategy. W= write it down A=  associate it R=  repeat it M=  make a mental picture  You can keep a Memory Notebook. Use a 3-ring notebook with sections for the following:  calendar, important names and phone numbers, medications, doctors' names/phone numbers, "to do list"/reminders, and a section to journal what you did each day  Use a calendar to write appointments down.  Write yourself a schedule for the day.  This can be placed on the calendar or in a separate section of the Memory Notebook.  Keeping a regular schedule can help memory.  Use medication organizer with sections for each day or morning/evening pills  You may need help loading it  Keep a basket, or pegboard by the door.   Place items that you need to take out with you in the basket or on the pegboard.  You may also want to include a message board for reminders.  Use sticky notes. Place sticky notes with reminders in a place where the task is performed.  For example:  "turn off the stove" placed by the stove, "lock the door" placed on the door at eye level, "take your medications" on the bathroom mirror or by the place where you normally take your medications  Use alarms/timers.  Use while cooking to remind yourself to check on food or as a reminder to take your medicine, or as a reminder to make a call, or as a reminder to perform another task, etc.  Use a voice recorder app or small tape recorder to record important information and notes for yourself. Go back at the end of the day and listen to these.
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
## 2021-04-29 NOTE — Patient Instructions (Signed)
Stroke Prevention Some medical conditions and lifestyle choices can lead to a higher risk for a stroke. You can help to prevent a stroke by eating healthy foods and exercising. It also helps to not smoke and to manage any health problems you may have. How can this condition affect me? A stroke is an emergency. It should be treated right away. A stroke can lead to brain damage or threaten your life. There is a better chance of surviving and getting better after a stroke if you get medical help right away. What can increase my risk? The following medical conditions may increase your risk of a stroke: Diseases of the heart and blood vessels (cardiovascular disease). High blood pressure (hypertension). Diabetes. High cholesterol. Sickle cell disease. Problems with blood clotting. Being very overweight. Sleeping problems (obstructivesleep apnea). Other risk factors include: Being older than age 19. A history of blood clots, stroke, or mini-stroke (TIA). Race, ethnic background, or a family history of stroke. Smoking or using tobacco products. Taking birth control pills, especially if you smoke. Heavy alcohol and drug use. Not being active. What actions can I take to prevent this? Manage your health conditions High cholesterol. Eat a healthy diet. If this is not enough to manage your cholesterol, you may need to take medicines. Take medicines as told by your doctor. High blood pressure. Try to keep your blood pressure below 130/80. If your blood pressure cannot be managed through a healthy diet and regular exercise, you may need to take medicines. Take medicines as told by your doctor. Ask your doctor if you should check your blood pressure at home. Have your blood pressure checked every year. Diabetes. Eat a healthy diet and get regular exercise. If your blood sugar (glucose) cannot be managed through diet and exercise, you may need to take medicines. Take medicines as told by your  doctor. Talk to your doctor about getting checked for sleeping problems. Signs of a problem can include: Snoring a lot. Feeling very tired. Make sure that you manage any other conditions you have. Nutrition  Follow instructions from your doctor about what to eat or drink. You may be told to: Eat and drink fewer calories each day. Limit how much salt (sodium) you use to 1,500 milligrams (mg) each day. Use only healthy fats for cooking, such as olive oil, canola oil, and sunflower oil. Eat healthy foods. To do this: Choose foods that are high in fiber. These include whole grains, and fresh fruits and vegetables. Eat at least 5 servings of fruits and vegetables a day. Try to fill one-half of your plate with fruits and vegetables at each meal. Choose low-fat (lean) proteins. These include low-fat cuts of meat, chicken without skin, fish, tofu, beans, and nuts. Eat low-fat dairy products. Avoid foods that: Are high in salt. Have saturated fat. Have trans fat. Have cholesterol. Are processed or pre-made. Count how many carbohydrates you eat and drink each day. Lifestyle If you drink alcohol: Limit how much you have to: 0-1 drink a day for women who are not pregnant. 0-2 drinks a day for men. Know how much alcohol is in your drink. In the [place] one drink equals one 12 oz bottle of beer (334m), one 5 oz glass of wine (1425m, or one 1 oz glass of hard liquor (445m Do not smoke or use any products that have nicotine or tobacco. If you need help quitting, ask your doctor. Avoid secondhand smoke. Do not use drugs. Activity  Try to stay at a  healthy weight. Get at least 30 minutes of exercise on most days, such as: Fast walking. Biking. Swimming. Medicines Take over-the-counter and prescription medicines only as told by your doctor. Avoid taking birth control pills. Talk to your doctor about the risks of taking birth control pills if: You are over 41 years old. You smoke. You get  very bad headaches. You have had a blood clot. Where to find more information American Stroke Association: www.strokeassociation.org Get help right away if: You or a loved one has any signs of a stroke. "BE FAST" is an easy way to remember the warning signs: B - Balance. Dizziness, sudden trouble walking, or loss of balance. E - Eyes. Trouble seeing or a change in how you see. F - Face. Sudden weakness or loss of feeling of the face. The face or eyelid may droop on one side. A - Arms. Weakness or loss of feeling in an arm. This happens all of a sudden and most often on one side of the body. S - Speech. Sudden trouble speaking, slurred speech, or trouble understanding what people say. T - Time. Time to call emergency services. Write down what time symptoms started. You or a loved one has other signs of a stroke, such as: A sudden, very bad headache with no known cause. Feeling like you may vomit (nausea). Vomiting. A seizure. These symptoms may be an emergency. Get help right away. Call your local emergency services (911 in the [place] Do not wait to see if the symptoms will go away. Do not drive yourself to the hospital. Summary You can help to prevent a stroke by eating healthy, exercising, and not smoking. It also helps to manage any health problems you have. Do not smoke or use any products that contain nicotine or tobacco. Get help right away if you or a loved one has any signs of a stroke. This information is not intended to replace advice given to you by your health care provider. Make sure you discuss any questions you have with your health care provider. Document Revised: 03/03/2020 Document Reviewed: 03/03/2020 Elsevier Patient Education  [place].

## 2021-05-01 ENCOUNTER — Ambulatory Visit: Payer: Medicare Other

## 2021-05-01 ENCOUNTER — Other Ambulatory Visit: Payer: Self-pay

## 2021-05-01 VITALS — BP 184/90

## 2021-05-01 DIAGNOSIS — R42 Dizziness and giddiness: Secondary | ICD-10-CM | POA: Diagnosis not present

## 2021-05-01 DIAGNOSIS — R2681 Unsteadiness on feet: Secondary | ICD-10-CM | POA: Diagnosis not present

## 2021-05-01 DIAGNOSIS — M6281 Muscle weakness (generalized): Secondary | ICD-10-CM

## 2021-05-01 DIAGNOSIS — R262 Difficulty in walking, not elsewhere classified: Secondary | ICD-10-CM | POA: Diagnosis not present

## 2021-05-01 DIAGNOSIS — R4701 Aphasia: Secondary | ICD-10-CM | POA: Diagnosis not present

## 2021-05-01 DIAGNOSIS — R41841 Cognitive communication deficit: Secondary | ICD-10-CM | POA: Diagnosis not present
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
## 2021-05-06 ENCOUNTER — Other Ambulatory Visit: Payer: Self-pay

## 2021-05-06 ENCOUNTER — Ambulatory Visit: Payer: Medicare Other

## 2021-05-06 VITALS — BP 185/94 | HR 69

## 2021-05-06 DIAGNOSIS — R2681 Unsteadiness on feet: Secondary | ICD-10-CM

## 2021-05-06 DIAGNOSIS — R42 Dizziness and giddiness: Secondary | ICD-10-CM

## 2021-05-06 DIAGNOSIS — M6281 Muscle weakness (generalized): Secondary | ICD-10-CM
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
## 2021-05-08 ENCOUNTER — Ambulatory Visit: Payer: Medicare Other

## 2021-05-08 ENCOUNTER — Other Ambulatory Visit: Payer: Self-pay

## 2021-05-08 VITALS — BP 192/94 | HR 58

## 2021-05-08 DIAGNOSIS — R42 Dizziness and giddiness: Secondary | ICD-10-CM

## 2021-05-08 DIAGNOSIS — R2681 Unsteadiness on feet: Secondary | ICD-10-CM
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
## 2021-05-12 ENCOUNTER — Encounter (HOSPITAL_COMMUNITY): Payer: Self-pay

## 2021-05-12 ENCOUNTER — Other Ambulatory Visit: Payer: Self-pay

## 2021-05-12 ENCOUNTER — Ambulatory Visit (HOSPITAL_COMMUNITY)
Admission: EM | Admit: 2021-05-12 | Discharge: 2021-05-12 | Disposition: A | Discharge status: Home / Self Care | Payer: Medicare Other

## 2021-05-12 DIAGNOSIS — M25561 Pain in right knee: Secondary | ICD-10-CM
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
## 2021-05-12 NOTE — Discharge Instructions (Addendum)
-  Tylenol/ibuprofen, rest, ice -Follow-up with PCP in 1 week if symptoms persist, or follow-up with [place] or PCP sooner if symptoms get worse

## 2021-05-12 NOTE — ED Triage Notes (Signed)
Pt presents with pain in right leg since waking up this morning with no known injury.

## 2021-05-13 ENCOUNTER — Ambulatory Visit: Payer: Medicare Other | Admitting: Physical Therapy

## 2021-05-15 ENCOUNTER — Ambulatory Visit: Payer: Medicare Other

## 2021-05-15 ENCOUNTER — Other Ambulatory Visit: Payer: Self-pay

## 2021-05-15 ENCOUNTER — Ambulatory Visit: Payer: Medicare Other | Admitting: Physical Therapy

## 2021-05-15 DIAGNOSIS — M6281 Muscle weakness (generalized): Secondary | ICD-10-CM | POA: Diagnosis not present

## 2021-05-15 DIAGNOSIS — R41841 Cognitive communication deficit: Secondary | ICD-10-CM

## 2021-05-15 DIAGNOSIS — R2681 Unsteadiness on feet: Secondary | ICD-10-CM | POA: Diagnosis not present

## 2021-05-15 DIAGNOSIS — R4701 Aphasia: Secondary | ICD-10-CM | POA: Diagnosis not present

## 2021-05-15 DIAGNOSIS — R42 Dizziness and giddiness: Secondary | ICD-10-CM | POA: Diagnosis not present

## 2021-05-15 DIAGNOSIS — R262 Difficulty in walking, not elsewhere classified: Secondary | ICD-10-CM | POA: Diagnosis not present
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
## 2021-05-15 NOTE — Patient Instructions (Signed)
  Please complete the assigned speech therapy homework prior to your next session and return it to the speech therapist at your next visit.  

## 2021-05-20 ENCOUNTER — Other Ambulatory Visit: Payer: Self-pay

## 2021-05-20 ENCOUNTER — Ambulatory Visit: Payer: Medicare Other | Attending: Family Medicine | Admitting: Physical Therapy

## 2021-05-20 VITALS — BP 184/98

## 2021-05-20 DIAGNOSIS — R41841 Cognitive communication deficit: Secondary | ICD-10-CM | POA: Insufficient documentation

## 2021-05-20 DIAGNOSIS — R262 Difficulty in walking, not elsewhere classified: Secondary | ICD-10-CM | POA: Diagnosis not present

## 2021-05-20 DIAGNOSIS — R4701 Aphasia: Secondary | ICD-10-CM | POA: Diagnosis not present

## 2021-05-20 DIAGNOSIS — R2681 Unsteadiness on feet: Secondary | ICD-10-CM | POA: Insufficient documentation
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
## 2021-05-20 NOTE — Patient Instructions (Signed)

## 2021-05-21 DIAGNOSIS — I10 Essential (primary) hypertension: Secondary | ICD-10-CM | POA: Diagnosis not present

## 2021-05-21 DIAGNOSIS — G2581 Restless legs syndrome: Secondary | ICD-10-CM | POA: Diagnosis not present

## 2021-05-21 DIAGNOSIS — H9193 Unspecified hearing loss, bilateral: Secondary | ICD-10-CM | POA: Diagnosis not present

## 2021-05-21 DIAGNOSIS — F064 Anxiety disorder due to known physiological condition: Secondary | ICD-10-CM | POA: Diagnosis not present

## 2021-05-21 DIAGNOSIS — I639 Cerebral infarction, unspecified: Secondary | ICD-10-CM | POA: Diagnosis not present

## 2021-05-22 ENCOUNTER — Ambulatory Visit: Payer: Medicare Other | Admitting: Physical Therapy

## 2021-05-22 ENCOUNTER — Other Ambulatory Visit: Payer: Self-pay

## 2021-05-22 ENCOUNTER — Encounter: Payer: Self-pay | Admitting: Physical Therapy

## 2021-05-22 VITALS — BP 172/90

## 2021-05-22 DIAGNOSIS — R4701 Aphasia: Secondary | ICD-10-CM | POA: Diagnosis not present

## 2021-05-22 DIAGNOSIS — R2681 Unsteadiness on feet: Secondary | ICD-10-CM

## 2021-05-22 DIAGNOSIS — R262 Difficulty in walking, not elsewhere classified: Secondary | ICD-10-CM

## 2021-05-22 DIAGNOSIS — R41841 Cognitive communication deficit: Secondary | ICD-10-CM | POA: Diagnosis not present
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
## 2021-05-22 NOTE — Patient Instructions (Addendum)
Access Code: [email or web address] URL: [email or web address] Date: 05/22/2021 Prepared by: Mady Haagensen  Exercises Single Leg Stance with Support - 1 x daily - 5 x weekly - 1 sets - 3 reps - 10 sec hold Side Stepping with Counter Support - 1 x daily - 5 x weekly - 1 sets - 5 reps Alternating Step Taps with Counter Support - 1 x daily - 5 x weekly - 1-2 sets - 10 reps Forward Step Up - 1 x daily - 5 x weekly - 1-2 sets - 10 reps   Corner balance exercises (see treatment note for review)  Added 05/22/21 Walking with Head Rotation - 1 x daily - 5 x weekly - 3 sets - 3 reps

## 2021-05-25 DIAGNOSIS — R293 Abnormal posture: Secondary | ICD-10-CM | POA: Diagnosis not present

## 2021-05-26 ENCOUNTER — Ambulatory Visit: Payer: Medicare Other

## 2021-05-29 ENCOUNTER — Ambulatory Visit: Payer: Medicare Other

## 2021-06-05 ENCOUNTER — Ambulatory Visit: Payer: Medicare Other

## 2021-06-11 DIAGNOSIS — I10 Essential (primary) hypertension: Secondary | ICD-10-CM | POA: Diagnosis not present

## 2021-06-11 DIAGNOSIS — Z0000 Encounter for general adult medical examination without abnormal findings: Secondary | ICD-10-CM | POA: Diagnosis not present

## 2021-06-12 ENCOUNTER — Other Ambulatory Visit: Payer: Self-pay

## 2021-06-12 ENCOUNTER — Ambulatory Visit: Payer: Medicare Other

## 2021-06-12 DIAGNOSIS — R262 Difficulty in walking, not elsewhere classified: Secondary | ICD-10-CM | POA: Diagnosis not present

## 2021-06-12 DIAGNOSIS — R41841 Cognitive communication deficit: Secondary | ICD-10-CM | POA: Diagnosis not present

## 2021-06-12 DIAGNOSIS — R4701 Aphasia: Secondary | ICD-10-CM

## 2021-06-12 DIAGNOSIS — R2681 Unsteadiness on feet: Secondary | ICD-10-CM | POA: Diagnosis not present
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
## 2021-06-19 ENCOUNTER — Ambulatory Visit: Payer: Medicare Other | Attending: Family Medicine

## 2021-06-19 ENCOUNTER — Other Ambulatory Visit: Payer: Self-pay

## 2021-06-19 DIAGNOSIS — R42 Dizziness and giddiness: Secondary | ICD-10-CM | POA: Insufficient documentation

## 2021-06-19 DIAGNOSIS — R2681 Unsteadiness on feet: Secondary | ICD-10-CM | POA: Diagnosis not present

## 2021-06-19 DIAGNOSIS — M6281 Muscle weakness (generalized): Secondary | ICD-10-CM | POA: Diagnosis not present

## 2021-06-19 DIAGNOSIS — R262 Difficulty in walking, not elsewhere classified: Secondary | ICD-10-CM | POA: Insufficient documentation
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
## 2021-06-19 NOTE — Patient Instructions (Addendum)
Access Code: [email or web address] URL: [email or web address] Date: 06/19/2021 Prepared by: Baldomero Lamy  Exercises Single Leg Stance with Support - 1 x daily - 5 x weekly - 1 sets - 3 reps - 10 sec hold Side Stepping with Counter Support - 1 x daily - 5 x weekly - 1 sets - 5 reps Alternating Step Taps with Counter Support - 1 x daily - 5 x weekly - 1-2 sets - 10 reps Forward Step Up - 1 x daily - 5 x weekly - 1-2 sets - 10 reps Walking with Head Rotation - 1 x daily - 5 x weekly - 3 sets - 3 reps Tandem Walking - 1 x daily - 5 x weekly - 3 sets - 3 reps   PT provided walking program handout for patient and educating on proper completion for improved endurance.

## 2021-07-03 DIAGNOSIS — F064 Anxiety disorder due to known physiological condition: Secondary | ICD-10-CM | POA: Diagnosis not present

## 2021-07-03 DIAGNOSIS — I10 Essential (primary) hypertension: Secondary | ICD-10-CM | POA: Diagnosis not present

## 2021-07-03 DIAGNOSIS — I639 Cerebral infarction, unspecified: Secondary | ICD-10-CM | POA: Diagnosis not present

## 2021-07-03 DIAGNOSIS — H9193 Unspecified hearing loss, bilateral: Secondary | ICD-10-CM | POA: Diagnosis not present

## 2021-07-12 ENCOUNTER — Other Ambulatory Visit: Payer: Self-pay

## 2021-07-12 ENCOUNTER — Emergency Department (HOSPITAL_COMMUNITY): Payer: Medicare Other

## 2021-07-12 ENCOUNTER — Encounter (HOSPITAL_COMMUNITY): Payer: Self-pay | Admitting: Emergency Medicine

## 2021-07-12 ENCOUNTER — Emergency Department (HOSPITAL_COMMUNITY)
Admission: EM | Admit: 2021-07-12 | Discharge: 2021-07-12 | Disposition: A | Discharge status: Home / Self Care | Payer: Medicare Other | Attending: Emergency Medicine | Admitting: Emergency Medicine

## 2021-07-12 DIAGNOSIS — I129 Hypertensive chronic kidney disease with stage 1 through stage 4 chronic kidney disease, or unspecified chronic kidney disease: Secondary | ICD-10-CM | POA: Insufficient documentation

## 2021-07-12 DIAGNOSIS — Z91040 Latex allergy status: Secondary | ICD-10-CM | POA: Diagnosis not present

## 2021-07-12 DIAGNOSIS — I10 Essential (primary) hypertension: Secondary | ICD-10-CM

## 2021-07-12 DIAGNOSIS — Z91010 Allergy to peanuts: Secondary | ICD-10-CM | POA: Diagnosis not present

## 2021-07-12 DIAGNOSIS — R519 Headache, unspecified: Secondary | ICD-10-CM

## 2021-07-12 DIAGNOSIS — Z79899 Other long term (current) drug therapy: Secondary | ICD-10-CM | POA: Diagnosis not present

## 2021-07-12 DIAGNOSIS — N1831 Chronic kidney disease, stage 3a: Secondary | ICD-10-CM | POA: Diagnosis not present

## 2021-07-12 LAB — CBC WITH DIFFERENTIAL/PLATELET
Abs Immature Granulocytes: 0.01 10*3/uL (ref 0.00–0.07)
Basophils Absolute: 0 10*3/uL (ref 0.0–0.1)
Basophils Relative: 1 %
Eosinophils Absolute: 0.1 10*3/uL (ref 0.0–0.5)
Eosinophils Relative: 1 %
HCT: 40.1 % (ref 36.0–46.0)
Hemoglobin: 12.8 g/dL (ref 12.0–15.0)
Immature Granulocytes: 0 %
Lymphocytes Relative: 24 %
Lymphs Abs: 1.4 10*3/uL (ref 0.7–4.0)
MCH: 29.6 pg (ref 26.0–34.0)
MCHC: 31.9 g/dL (ref 30.0–36.0)
MCV: 92.6 fL (ref 80.0–100.0)
Monocytes Absolute: 0.6 10*3/uL (ref 0.1–1.0)
Monocytes Relative: 11 %
Neutro Abs: 3.8 10*3/uL (ref 1.7–7.7)
Neutrophils Relative %: 63 %
Platelets: 233 10*3/uL (ref 150–400)
RBC: 4.33 MIL/uL (ref 3.87–5.11)
RDW: 14.1 % (ref 11.5–15.5)
WBC: 5.9 10*3/uL (ref 4.0–10.5)
nRBC: 0 % (ref 0.0–0.2)

## 2021-07-12 LAB — COMPREHENSIVE METABOLIC PANEL
ALT: 11 U/L (ref 0–44)
AST: 17 U/L (ref 15–41)
Albumin: 4.2 g/dL (ref 3.5–5.0)
Alkaline Phosphatase: 85 U/L (ref 38–126)
Anion gap: 9 (ref 5–15)
BUN: 10 mg/dL (ref 8–23)
CO2: 26 mmol/L (ref 22–32)
Calcium: 9.6 mg/dL (ref 8.9–10.3)
Chloride: 102 mmol/L (ref 98–111)
Creatinine, Ser: 1 mg/dL (ref 0.44–1.00)
GFR, Estimated: 58 mL/min — ABNORMAL LOW (ref >60)
Glucose, Bld: 103 mg/dL — ABNORMAL HIGH (ref 70–99)
Potassium: 3.6 mmol/L (ref 3.5–5.1)
Sodium: 137 mmol/L (ref 135–145)
Total Bilirubin: 0.8 mg/dL (ref 0.3–1.2)
Total Protein: 7.6 g/dL (ref 6.5–8.1)

## 2021-07-12 LAB — CBG MONITORING, ED: Glucose-Capillary: 76 mg/dL (ref 70–99)

## 2021-07-12 LAB — URINALYSIS, ROUTINE W REFLEX MICROSCOPIC
Bilirubin Urine: NEGATIVE
Glucose, UA: NEGATIVE mg/dL
Hgb urine dipstick: NEGATIVE
Ketones, ur: NEGATIVE mg/dL
Leukocytes,Ua: NEGATIVE
Nitrite: NEGATIVE
Protein, ur: NEGATIVE mg/dL
Specific Gravity, Urine: 1.002 — ABNORMAL LOW (ref 1.005–1.030)
pH: 8 (ref 5.0–8.0)

## 2021-07-12 MED ORDER — ACETAMINOPHEN 325 MG PO TABS
650.0000 mg | ORAL_TABLET | Freq: Once | ORAL | Status: AC
  Administered 2021-07-12: 17:00:00 650 mg via ORAL
  Filled 2021-07-12: qty 2
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
## 2021-07-12 NOTE — ED Provider Notes (Signed)
Emergency Medicine Provider Triage Evaluation Note  Karla Richards , a 75 y.o. female  was evaluated in triage.  Pt complains of headache.  Review of Systems  Positive: Headache, numbness to L side of head Negative: Focal weakness, fever, neck stiffness, vision changes  Physical Exam  BP (!) 201/95 (BP Location: Left Arm)   Pulse 79   Temp 98.2 F (36.8 C)   Resp 16   SpO2 99%  Gen:   Awake, no distress   Resp:  Normal effort  MSK:   Moves extremities without difficulty  Other:    Medical Decision Making  Medically screening exam initiated at 1:56 PM.  Appropriate orders placed.  Karla Richards was informed that the remainder of the evaluation will be completed by another provider, this initial triage assessment does not replace that evaluation, and the importance of remaining in the ED until their evaluation is complete.  Pt here with acute onset of L sided headache and numbness to L side of face that started 2 hrs ago.  Denies focal weakness.  Found to be hypertensive with BP 201/95.    Domenic Moras, PA-C 07/12/21 1402    Wyvonnia Dusky, MD 07/12/21 [phone]

## 2021-07-12 NOTE — Discharge Instructions (Addendum)
You were seen in the emergency department for an acute headache.  You had CAT scan and lab work, EKG and urinalysis that did not show any significant abnormalities.  Your symptoms improved while you were here.  Your blood pressure was elevated and this will also need to be followed up with your primary care doctor.  Please continue your regular medications.  Return to the emergency department if any worsening or concerning symptoms

## 2021-07-12 NOTE — ED Triage Notes (Signed)
C/o L sided headache that started suddenly at noon today.  Now reports numbness to L side of head.  No facial droop or numbness.  No arm drift.  VAN negative.

## 2021-07-16 DIAGNOSIS — F064 Anxiety disorder due to known physiological condition: Secondary | ICD-10-CM | POA: Diagnosis not present

## 2021-07-16 DIAGNOSIS — H1133 Conjunctival hemorrhage, bilateral: Secondary | ICD-10-CM | POA: Diagnosis not present

## 2021-07-16 DIAGNOSIS — I10 Essential (primary) hypertension: Secondary | ICD-10-CM | POA: Diagnosis not present

## 2021-07-31 DIAGNOSIS — I10 Essential (primary) hypertension: Secondary | ICD-10-CM | POA: Diagnosis not present

## 2021-07-31 DIAGNOSIS — I639 Cerebral infarction, unspecified: Secondary | ICD-10-CM | POA: Diagnosis not present

## 2021-10-31 ENCOUNTER — Other Ambulatory Visit: Payer: Self-pay

## 2021-10-31 ENCOUNTER — Encounter (HOSPITAL_COMMUNITY): Payer: Self-pay

## 2021-10-31 ENCOUNTER — Ambulatory Visit (HOSPITAL_COMMUNITY)
Admission: RE | Admit: 2021-10-31 | Discharge: 2021-10-31 | Disposition: A | Discharge status: Home / Self Care | Payer: Medicare Other | Source: Ambulatory Visit | Attending: Emergency Medicine | Admitting: Emergency Medicine

## 2021-10-31 VITALS — BP 212/93 | HR 68 | Temp 98.2°F | Resp 19

## 2021-10-31 DIAGNOSIS — M545 Low back pain, unspecified: Secondary | ICD-10-CM | POA: Diagnosis not present

## 2021-10-31 DIAGNOSIS — M79601 Pain in right arm: Secondary | ICD-10-CM

## 2021-10-31 LAB — POCT URINALYSIS DIPSTICK, ED / UC
Bilirubin Urine: NEGATIVE
Glucose, UA: NEGATIVE mg/dL
Hgb urine dipstick: NEGATIVE
Ketones, ur: NEGATIVE mg/dL
Leukocytes,Ua: NEGATIVE
Nitrite: NEGATIVE
Protein, ur: NEGATIVE mg/dL
Specific Gravity, Urine: 1.015 (ref 1.005–1.030)
Urobilinogen, UA: 0.2 mg/dL (ref 0.0–1.0)
pH: 7.5 (ref 5.0–8.0)

## 2021-10-31 MED ORDER — PREDNISONE 20 MG PO TABS: 40.0000 mg | ORAL_TABLET | Freq: Every day | ORAL | 0 refills | Status: DC
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
## 2021-10-31 NOTE — ED Triage Notes (Signed)
Pt had right flank pain for couple weeks. Pt reports that dull pain are constant, then get worse with movement. Also c/o right arm pain for 2 weeks as well. Reports that she types a lot and thought that is what pain was from originally. Denies injury, falls, accidents.  ?Denies urinary or bowel problems.  ?

## 2021-10-31 NOTE — Discharge Instructions (Signed)
Your pain is most likely caused by irritation to the muscles or ligaments.  ? ?Take prednisone every morning with food for 5 days, this medication is to help reduce irritation and inflammation ? ?You may use over-the-counter Voltaren gel or lidocaine gel directly onto the affected areas, this medication should not irritate your stomach ? ?You may use heating pad in 15 minute intervals as needed for additional comfort, or you may find comfort in using ice in 10-15 minutes over affected area ? ?Begin stretching affected area daily for 10 minutes as tolerated to further loosen muscles  ? ?When lying down place pillow underneath and between knees for support ? ?Can try sleeping without pillow on firm mattress  ? ?Practice good posture: head back, shoulders back, chest forward, pelvis back and weight distributed evenly on both legs ? ?If pain persist after recommended treatment or reoccurs if may be beneficial to follow up with orthopedic specialist for evaluation, this doctor specializes in the bones and can manage your symptoms long-term with options such as but not limited to imaging, medications or physical therapy  ?  ?

## 2021-11-13 DIAGNOSIS — H401123 Primary open-angle glaucoma, left eye, severe stage: Secondary | ICD-10-CM | POA: Diagnosis not present

## 2021-11-16 DIAGNOSIS — I10 Essential (primary) hypertension: Secondary | ICD-10-CM | POA: Diagnosis not present

## 2021-11-16 DIAGNOSIS — H9193 Unspecified hearing loss, bilateral: Secondary | ICD-10-CM | POA: Diagnosis not present

## 2021-11-16 DIAGNOSIS — I639 Cerebral infarction, unspecified: Secondary | ICD-10-CM | POA: Diagnosis not present

## 2021-11-16 DIAGNOSIS — M130 Polyarthritis, unspecified: Secondary | ICD-10-CM | POA: Diagnosis not present

## 2021-12-02 DIAGNOSIS — U071 COVID-19: Secondary | ICD-10-CM | POA: Diagnosis not present

## 2021-12-19 DIAGNOSIS — Z23 Encounter for immunization: Secondary | ICD-10-CM | POA: Diagnosis not present

## 2021-12-21 DIAGNOSIS — L299 Pruritus, unspecified: Secondary | ICD-10-CM | POA: Diagnosis not present

## 2022-01-26 ENCOUNTER — Encounter (HOSPITAL_COMMUNITY): Payer: Self-pay

## 2022-01-26 ENCOUNTER — Other Ambulatory Visit: Payer: Self-pay

## 2022-01-26 ENCOUNTER — Ambulatory Visit (HOSPITAL_COMMUNITY)
Admission: RE | Admit: 2022-01-26 | Discharge: 2022-01-26 | Disposition: A | Discharge status: Home / Self Care | Payer: Medicare Other | Source: Ambulatory Visit | Attending: Emergency Medicine | Admitting: Emergency Medicine

## 2022-01-26 VITALS — BP 182/83 | HR 70 | Temp 98.2°F | Resp 18

## 2022-01-26 DIAGNOSIS — H5789 Other specified disorders of eye and adnexa: Secondary | ICD-10-CM | POA: Diagnosis not present

## 2022-01-26 MED ORDER — OLOPATADINE HCL 0.1 % OP SOLN: 1.0000 [drp] | Freq: Two times a day (BID) | OPHTHALMIC | 0 refills | Status: AC

## 2022-01-26 NOTE — ED Triage Notes (Signed)
PT reports itching to upper and lower eye lid and redness to Lt eye.
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
## 2022-01-26 NOTE — Discharge Instructions (Addendum)
Please contact your eye doctor and see if you can move your appointment sooner.  Use the drops as prescribed for eye irritation.   Please return to the urgent care or emergency department if symptoms worsen or do not improve.

## 2022-02-08 DIAGNOSIS — R0789 Other chest pain: Secondary | ICD-10-CM | POA: Diagnosis not present

## 2022-02-08 DIAGNOSIS — I10 Essential (primary) hypertension: Secondary | ICD-10-CM | POA: Diagnosis not present

## 2022-02-09 ENCOUNTER — Ambulatory Visit (INDEPENDENT_AMBULATORY_CARE_PROVIDER_SITE_OTHER): Payer: Medicare Other | Admitting: Orthopedic Surgery

## 2022-02-09 ENCOUNTER — Encounter: Payer: Self-pay | Admitting: Orthopedic Surgery

## 2022-02-09 DIAGNOSIS — M7711 Lateral epicondylitis, right elbow: Secondary | ICD-10-CM | POA: Diagnosis not present

## 2022-02-14 ENCOUNTER — Encounter (HOSPITAL_COMMUNITY): Payer: Self-pay | Admitting: Emergency Medicine

## 2022-02-14 ENCOUNTER — Ambulatory Visit (HOSPITAL_COMMUNITY)
Admission: EM | Admit: 2022-02-14 | Discharge: 2022-02-14 | Disposition: A | Discharge status: Home / Self Care | Payer: Medicare Other | Attending: Physician Assistant | Admitting: Physician Assistant

## 2022-02-14 DIAGNOSIS — I10 Essential (primary) hypertension: Secondary | ICD-10-CM | POA: Diagnosis not present

## 2022-02-14 DIAGNOSIS — R519 Headache, unspecified: Secondary | ICD-10-CM | POA: Diagnosis not present

## 2022-02-14 DIAGNOSIS — R5381 Other malaise: Secondary | ICD-10-CM | POA: Diagnosis not present

## 2022-02-14 DIAGNOSIS — R5383 Other fatigue: Secondary | ICD-10-CM

## 2022-02-14 LAB — POCT URINALYSIS DIPSTICK, ED / UC
Bilirubin Urine: NEGATIVE
Glucose, UA: NEGATIVE mg/dL
Hgb urine dipstick: NEGATIVE
Ketones, ur: NEGATIVE mg/dL
Leukocytes,Ua: NEGATIVE
Nitrite: NEGATIVE
Protein, ur: NEGATIVE mg/dL
Specific Gravity, Urine: <=1.005 (ref 1.005–1.030)
Urobilinogen, UA: 0.2 mg/dL (ref 0.0–1.0)
pH: 6 (ref 5.0–8.0)

## 2022-02-14 MED ORDER — ACETAMINOPHEN 325 MG PO TABS
ORAL_TABLET | ORAL | Status: AC
  Filled 2022-02-14: qty 2

## 2022-02-14 MED ORDER — ACETAMINOPHEN 325 MG PO TABS
650.0000 mg | ORAL_TABLET | Freq: Once | ORAL | Status: AC
Start: 2022-02-14 — End: 2022-02-14
  Administered 2022-02-14: 650 mg via ORAL
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
## 2022-02-14 NOTE — Discharge Instructions (Signed)
Please go to the emergency room if anything is worsening or if you are not feeling better.  Follow-up with your PCP first thing tomorrow.  Make sure you rest drink plenty of fluid.

## 2022-02-14 NOTE — ED Triage Notes (Signed)
Pt c/o left sided headache that caused nausea and fatigue. Reports this morning drank two glasses of water and reports only voided twice today.  Slept 8 hours last night and did her regular exercises this morning.

## 2022-02-17 ENCOUNTER — Encounter: Payer: Self-pay | Admitting: Rehabilitative and Restorative Service Providers"

## 2022-02-17 ENCOUNTER — Ambulatory Visit (INDEPENDENT_AMBULATORY_CARE_PROVIDER_SITE_OTHER): Payer: Medicare Other | Admitting: Rehabilitative and Restorative Service Providers"

## 2022-02-17 ENCOUNTER — Other Ambulatory Visit: Payer: Self-pay

## 2022-02-17 DIAGNOSIS — M25631 Stiffness of right wrist, not elsewhere classified: Secondary | ICD-10-CM

## 2022-02-17 DIAGNOSIS — R202 Paresthesia of skin: Secondary | ICD-10-CM

## 2022-02-17 DIAGNOSIS — M25611 Stiffness of right shoulder, not elsewhere classified: Secondary | ICD-10-CM
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
## 2022-02-19 DIAGNOSIS — R413 Other amnesia: Secondary | ICD-10-CM | POA: Diagnosis not present

## 2022-02-19 DIAGNOSIS — I639 Cerebral infarction, unspecified: Secondary | ICD-10-CM | POA: Diagnosis not present

## 2022-02-19 DIAGNOSIS — I10 Essential (primary) hypertension: Secondary | ICD-10-CM | POA: Diagnosis not present

## 2022-02-25 ENCOUNTER — Ambulatory Visit (INDEPENDENT_AMBULATORY_CARE_PROVIDER_SITE_OTHER): Payer: Medicare Other | Admitting: Rehabilitative and Restorative Service Providers"

## 2022-02-25 ENCOUNTER — Encounter: Payer: Self-pay | Admitting: Rehabilitative and Restorative Service Providers"

## 2022-02-25 DIAGNOSIS — M25611 Stiffness of right shoulder, not elsewhere classified: Secondary | ICD-10-CM

## 2022-02-25 DIAGNOSIS — R202 Paresthesia of skin: Secondary | ICD-10-CM | POA: Diagnosis not present

## 2022-02-25 DIAGNOSIS — M6281 Muscle weakness (generalized): Secondary | ICD-10-CM

## 2022-02-25 DIAGNOSIS — M25631 Stiffness of right wrist, not elsewhere classified: Secondary | ICD-10-CM | POA: Diagnosis not present

## 2022-02-25 NOTE — Therapy (Signed)
OUTPATIENT OCCUPATIONAL THERAPY TREATMENT NOTE   Patient Name: Karla Richards MRN: [id number] DOB:1946/06/22, 76 y.o., female Today's Date: 02/25/2022  PCP: Dr. Lucianne Lei REFERRING PROVIDER: Dr. Sherilyn Cooter  END OF SESSION:   OT End of Session - 02/25/22 1527     Visit Number 2    Number of Visits 8    Date for OT Re-Evaluation 04/02/22    Authorization Type Medicare and [employer]    Progress Note Due on Visit 10    OT Start Time 1528    OT Stop Time 1601    OT Time Calculation (min) 33 min    Activity Tolerance Patient tolerated treatment well;No increased pain;Patient limited by fatigue;Patient limited by pain    Behavior During Therapy [facility] for tasks assessed/performed             Past Medical History:  Diagnosis Date   Arthritis    hands, but reports that she is still active    Cancer ([place]) 2019   skin cancer   Complication of anesthesia    states 44 yrs. ago she was given "Seconal" for childbirth  & she was passed out & felt like she was on fire   Coronary artery disease    Genital herpes    Glaucoma    bilateral    Hearing difficulty    bilateral, Left worse than R , no aids yet    Hypertension    Sleep apnea    tested negative but wakes up gasping for air in her sleep; does not qualify for CPAP   Past Surgical History:  Procedure Laterality Date   CESAREAN SECTION  1970, '72, '79   coronary catherization     MOUTH SURGERY     TRABECULECTOMY Left 03/26/2015   Procedure: TRABECULECTOMY WITH [facility] ON THE LEFT EYE;  Surgeon: Marylynn Pearson, MD;  Location: [place];  Service: Ophthalmology;  Laterality: Left;   TUBAL LIGATION     Patient Active Problem List   Diagnosis Date Noted   Lateral epicondylitis, right elbow 02/09/2022   Glaucoma 02/28/2021   Hyponatremia 02/28/2021   Stage 3a chronic kidney disease ([place]) 02/28/2021   TIA (transient ischemic attack) 10/09/2014   Dizziness, nonspecific 10/09/2014   Dizziness    Left leg numbness  03/10/2013   Chest pain on exertion 03/10/2013   HTN (hypertension) 03/10/2013    ONSET DATE: Chronic, 3-6 months    REFERRING DIAG: M77.11 (ICD-10-CM) - Lateral epicondylitis, right elbow  THERAPY DIAG:  Stiffness of right wrist, not elsewhere classified  Paresthesia of skin  Stiffness of right shoulder, not elsewhere classified  Muscle weakness (generalized)  Rationale for Evaluation and Treatment Rehabilitation    PERTINENT HISTORY: Per MD: "Right lateral epicondylitis.  Minimal provocative signs but point tender of lateral epicondyle.  Vague symptoms."  She states her top 3 concerns are: numbness in volar tips of digits 1-3 and on back of thumb slightly; tightness in neck/shoulder on right side; numbness in toes and tightness in hip on right side. She has no night symptoms.    PRECAUTIONS: none    WEIGHT BEARING RESTRICTIONS No   SUBJECTIVE:  She states she initially felt sore and tired from HEP, but got stronger over the next few days and now has some relief. She is also trying to avoid sleeping in poor positions.   PAIN:  Are you having pain? No, only stiffness  Rating: 0/10 at rest now    OBJECTIVE: (All objective assessments below are from  initial evaluation on: 02/17/22 unless otherwise specified.)   HAND DOMINANCE: Right    ADLs: Overall ADLs: States decreased ability to grab, hold household objects, difficulty opening containers, performing all FMS tasks, etc.      FUNCTIONAL OUTCOME MEASURES: 02/25/22: Quick DASH 20% impairment     UPPER EXTREMITY ROM    Eval: neck motion significantly tighter when looking/moving to her left (limited on right side where she feels tightness). Right shoulder also mildly tighter. Right hand makes full fist and can fully oppose.    Active ROM Right eval Left eval  Shoulder flexion 126 132  Shoulder abduction ~130 ~130  Shoulder adduction      Shoulder extension      Shoulder internal rotation      Shoulder external  rotation      Elbow flexion      Elbow extension      Wrist flexion 68 73  Wrist extension 48 60  Wrist pronation 80 80  Wrist supination 80 80  (Blank rows = not tested)     UPPER EXTREMITY MMT:      MMT Right eval Left eval  Shoulder flexion      Shoulder abduction      Shoulder adduction      Shoulder extension      Shoulder internal rotation      Shoulder external rotation      Middle trapezius      Lower trapezius      Elbow flexion 5/5 5/5  Elbow extension 5/5 5/5  Wrist flexion 4+/5    Wrist extension 4+/5    Wrist ulnar deviation      Wrist radial deviation      Wrist pronation      Wrist supination      (Blank rows = not tested)   HAND FUNCTION: Eval: Grip strength Right: 40 lbs, Left: 30 lbs; palmar pinch: 12# right, 11# left    COORDINATION: Eval: no overt issues at eval, details can be assessed PRN   SENSATION: Eval:  Light touch intact today, S2PD in right hand is 60m IF, 448mthumb, 82m3mF, 6mm47m  (no complaints left side)    EDEMA:             Eval: none noted at eval    COGNITION: Overall cognitive status: WFL for evaluation today though she did seem flustered, change complaints during course of eval and display some body awareness issues and decreased awareness of her own symptoms at times    OBSERVATIONS:    Eval: She seems to have tight right side of neck, shoulder, tighter right wrist. She does seem to have CTS complaints in right hand, though no positive provacative testing or 2-point discrimination issues standing out. Full elbow motion and strength with nothing painful, but possibly some radial nerve issues. (No tenderness at radial tunnel.)    Possibly some radiculopathy is present and may need more testing.      TODAY'S TREATMENT:  02/25/22: OT starts with discussion of daily tasks and ability, through use of Quick DASH assessment, and she states improvement in last week. She also states some trouble with jars, so OT supplies non-slip  dycem to help with those tasks. OT also reviews initial HEP and also assigns additional upgrades to strengthening and stretches as listed below. She demo's all back, states understanding and tolerates well.   Exercises - Seated Scapular Retraction  - 4-6 x daily - 1 sets - 10-15 reps - Seated  Cervical Rotation AROM  - 4-6 x daily - 1 sets - 10-15 reps - Seated Cervical Extension AROM  - 4-6 x daily - 1 sets - 10-15 reps - Seated Cervical Sidebending AROM  - 4-6 x daily - 1 sets - 10-15 reps - Supine Chin Tuck  - 4-6 x daily - 1 sets - 10-15 reps - Wrist Prayer Stretch  - 3-4 x daily - 3-5 reps - 15 sec hold - Seated Shoulder Row with Anchored Resistance  - 4-6 x daily - 1 sets - 10-15 reps - Seated Shoulder Flexion AAROM with Dowel  - 4-6 x daily - 1 sets - 10-15 reps - Seated Piriformis Stretch with Trunk Bend  - 4-6 x daily - 1 sets - 10-15 reps   Eval: OT provides education (self-care and neuromuscular re-ed) on nerve compression and postures as well as habits and routines that can negatively impact nerve symptoms (including sleep postures and typing positions).  OT also provides initial HEP exercises to help with complaints of neck/shoulder stiffness. OT assigns neck ext AROM, neck rotation AROM (b/l),  overhead reaching AROM, shoulder blade retraction isometrics (for bad postures and tightness), and hip/piriformis stretches or sciatica-like complaints. She demo's back each fairly well with no issues.      PATIENT EDUCATION: Education details: See tx section above for details  Person educated: Patient Education method: Verbal Instruction, Teach back, Handouts  Education comprehension: States and demonstrates understanding, Additional Education required      HOME EXERCISE PROGRAM: Access Code: [email or web address] URL: [email or web address] Date: 02/25/2022 Prepared by: Benito Mccreedy   GOALS: Goals reviewed with patient? Yes     SHORT TERM GOALS: (STG required if POC>30  days)   Pt will obtain protective or mobilizing custom orthotic. Target date: TBD, PRN Goal status: INITIAL   2.  Pt will complete Quick DASH to increase self-awareness of functional deficits.  Target date: 02/26/22 Goal status: 02/25/22 MET   3.  Pt will demo/state understanding of initial HEP to improve pain levels and prerequisite motion. Target date: 02/26/22 Goal status: 02/25/22: MET     LONG TERM GOALS:   Pt will improve functional ability by decreased impairment per Quick DASH assessment from 20% to 10% or better, for better quality of life. Target date: 04/02/22 Goal status: INITIAL   2.  Pt will improve numbness/tingling complaints from Moderate/severe to mild or better to have better use of hand for typing and other tasks.   Target date: 04/02/22 Goal status: INITIAL   3.  Pt will improve A/ROM in neck rotation from limited to the left to being equal bilaterally, to have functional motion and decreased possibility of nerve compression.  Target date: 04/02/22 Goal status: INITIAL       ASSESSMENT:   CLINICAL IMPRESSION: 02/25/22: She has made excellent improvement in 1 week and feels comfortable with HEP and additions. She elects to do HEP independently for the next few weeks, then f/u to determine effectiveness, any needed upgrades or new recommendations.   Eval: Patient is a 76 y.o. female who was seen today for occupational therapy evaluation for right neck and upper extremity tightness, tingling in right hand and decreased functional ability.      PLAN: OT FREQUENCY:  8 total visits PRN (up to 2 xweek)    OT DURATION: 6 weeks (through 04/02/22)    PLANNED INTERVENTIONS: self care/ADL training, therapeutic exercise, therapeutic activity, neuromuscular re-education, manual therapy, passive range of motion, splinting, ultrasound, fluidotherapy, moist heat, cryotherapy, contrast bath,  patient/family education, cognitive remediation/compensation, visual/perceptual  remediation/compensation, energy conservation, and coping strategies training   RECOMMENDED OTHER SERVICES: none now    CONSULTED AND AGREED WITH PLAN OF CARE: Patient   PLAN FOR NEXT SESSION:  F/U in 3-4 weeks and do reassessment of fnl ability and needs.   Benito Mccreedy, OTR/L, CHT 02/25/2022, 5:23 PM

## 2022-03-04 DIAGNOSIS — G44219 Episodic tension-type headache, not intractable: Secondary | ICD-10-CM | POA: Diagnosis not present

## 2022-03-08 DIAGNOSIS — D229 Melanocytic nevi, unspecified: Secondary | ICD-10-CM | POA: Diagnosis not present

## 2022-03-08 DIAGNOSIS — L821 Other seborrheic keratosis: Secondary | ICD-10-CM | POA: Diagnosis not present

## 2022-03-08 DIAGNOSIS — L814 Other melanin hyperpigmentation: Secondary | ICD-10-CM | POA: Diagnosis not present

## 2022-03-08 DIAGNOSIS — L570 Actinic keratosis: Secondary | ICD-10-CM | POA: Diagnosis not present

## 2022-03-09 DIAGNOSIS — I10 Essential (primary) hypertension: Secondary | ICD-10-CM | POA: Diagnosis not present

## 2022-03-10 DIAGNOSIS — R079 Chest pain, unspecified: Secondary | ICD-10-CM | POA: Diagnosis not present

## 2022-03-10 DIAGNOSIS — I5189 Other ill-defined heart diseases: Secondary | ICD-10-CM | POA: Diagnosis not present

## 2022-04-15 ENCOUNTER — Ambulatory Visit (INDEPENDENT_AMBULATORY_CARE_PROVIDER_SITE_OTHER): Payer: Medicare Other | Admitting: Orthopedic Surgery

## 2022-04-15 DIAGNOSIS — M7711 Lateral epicondylitis, right elbow: Secondary | ICD-10-CM | POA: Diagnosis not present

## 2022-04-15 NOTE — Progress Notes (Signed)
Office Visit Note   Patient: Karla Richards           Date of Birth: Jul 02, 1946           MRN: [id number] Visit Date: 04/15/2022              Requested by: Lucianne Lei, MD [address] [place],  [place] [address] PCP: Lucianne Lei, MD   Assessment & Plan: Visit Diagnoses:  1. Lateral epicondylitis, right elbow     Plan: Patient was previously seen for what seems like right lateral epicondylitis as well as global right hand weakness and stiffness.  She has been doing hand therapy and has improved significantly.  She actually has no complaints today.  She has no elbow pain.  In her hand feels much stronger than previous.   Follow-Up Instructions: No follow-ups on file.   Orders:  No orders of the defined types were placed in this encounter.  No orders of the defined types were placed in this encounter.     Procedures: No procedures performed   Clinical Data: No additional findings.   Subjective: Chief Complaint  Patient presents with   Right Hand - Follow-up    This is a 76 year old right-hand-dominant female presents for follow-up of her right upper extremity.  She was last seen in late June at which time she had vague complaints involving the entire extremity.  She seemingly had lateral epicondylitis with tenderness directly over the lateral epicondyle.  She also describes some stiffness in her hand particular intermittent middle finger.  She was referred to hand therapy.  She has done very well.  She has no elbow pain.  She has no pain or stiffness in her hand.  She has much improved hand strength.  She is overall very pleased and without complaint today.     Review of Systems   Objective: Vital Signs: There were no vitals taken for this visit.  Physical Exam  Right Hand Exam   Tenderness  The patient is experiencing no tenderness.   Range of Motion  The patient has normal right wrist ROM.   Other  Erythema: absent Sensation:  normal Pulse: present  Comments:  Full and painless ROM of all fingers.    Right Elbow Exam   Tenderness  The patient is experiencing no tenderness.   Range of Motion  The patient has normal right elbow ROM.  Other  Erythema: absent Sensation: normal Pulse: present      Specialty Comments:  No specialty comments available.  Imaging: No results found.   PMFS History: Patient Active Problem List   Diagnosis Date Noted   Lateral epicondylitis, right elbow 02/09/2022   Glaucoma 02/28/2021   Hyponatremia 02/28/2021   Stage 3a chronic kidney disease ([place]) 02/28/2021   TIA (transient ischemic attack) 10/09/2014   Dizziness, nonspecific 10/09/2014   Dizziness    Left leg numbness 03/10/2013   Chest pain on exertion 03/10/2013   HTN (hypertension) 03/10/2013   Past Medical History:  Diagnosis Date   Arthritis    hands, but reports that she is still active    Cancer ([place]) 2019   skin cancer   Complication of anesthesia    states 46 yrs. ago she was given "Seconal" for childbirth  & she was passed out & felt like she was on fire   Coronary artery disease    Genital herpes    Glaucoma    bilateral    Hearing difficulty  bilateral, Left worse than R , no aids yet    Hypertension    Sleep apnea    tested negative but wakes up gasping for air in her sleep; does not qualify for CPAP    Family History  Problem Relation Age of Onset   Heart attack Mother 25   Stomach cancer Sister    Stroke Maternal Grandmother 50   Colon cancer Maternal Uncle    Rectal cancer Maternal Uncle    Esophageal cancer Neg Hx     Past Surgical History:  Procedure Laterality Date   CESAREAN SECTION  1970, '72, '79   coronary catherization     MOUTH SURGERY     TRABECULECTOMY Left 03/26/2015   Procedure: TRABECULECTOMY WITH [facility] ON THE LEFT EYE;  Surgeon: Marylynn Pearson, MD;  Location: [place];  Service: Ophthalmology;  Laterality: Left;   TUBAL LIGATION     Social History    Occupational History   Occupation: owns a [employer]  Tobacco Use   Smoking status: Never   Smokeless tobacco: Never  Vaping Use   Vaping Use: Never used  Substance and Sexual Activity   Alcohol use: No   Drug use: No   Sexual activity: Not on file

## 2022-04-20 ENCOUNTER — Encounter: Payer: Medicare Other | Admitting: Rehabilitative and Restorative Service Providers"

## 2022-04-20 DIAGNOSIS — I10 Essential (primary) hypertension: Secondary | ICD-10-CM | POA: Diagnosis not present

## 2022-04-22 ENCOUNTER — Ambulatory Visit (INDEPENDENT_AMBULATORY_CARE_PROVIDER_SITE_OTHER): Payer: Medicare Other | Admitting: Rehabilitative and Restorative Service Providers"

## 2022-04-22 ENCOUNTER — Encounter: Payer: Self-pay | Admitting: Rehabilitative and Restorative Service Providers"

## 2022-04-22 DIAGNOSIS — M6281 Muscle weakness (generalized): Secondary | ICD-10-CM | POA: Diagnosis not present

## 2022-04-22 DIAGNOSIS — M25611 Stiffness of right shoulder, not elsewhere classified: Secondary | ICD-10-CM

## 2022-04-22 DIAGNOSIS — M25631 Stiffness of right wrist, not elsewhere classified: Secondary | ICD-10-CM

## 2022-04-22 DIAGNOSIS — R202 Paresthesia of skin: Secondary | ICD-10-CM | POA: Diagnosis not present

## 2022-04-22 NOTE — Therapy (Signed)
OUTPATIENT OCCUPATIONAL THERAPY TREATMENT & DISCHARGE NOTE   Patient Name: Karla Richards MRN: [id number] DOB:04/17/1946, 76 y.o., female Today's Date: 04/22/2022  PCP: Dr. Veita Bland REFERRING PROVIDER: Dr. Charlie Benfield  END OF SESSION:   OT End of Session - 04/22/22 1153     Visit Number 3    Number of Visits 8    Date for OT Re-Evaluation 04/02/22    Authorization Type Medicare and [employer]    Progress Note Due on Visit 10    OT Start Time 1154    OT Stop Time 1236    OT Time Calculation (min) 42 min    Activity Tolerance Patient tolerated treatment well;No increased pain    Behavior During Therapy WFL for tasks assessed/performed              Past Medical History:  Diagnosis Date   Arthritis    hands, but reports that she is still active    Cancer ([facility]) 2019   skin cancer   Complication of anesthesia    states 44 yrs. ago she was given "Seconal" for childbirth  & she was passed out & felt like she was on fire   Coronary artery disease    Genital herpes    Glaucoma    bilateral    Hearing difficulty    bilateral, Left worse than R , no aids yet    Hypertension    Sleep apnea    tested negative but wakes up gasping for air in her sleep; does not qualify for CPAP   Past Surgical History:  Procedure Laterality Date   CESAREAN SECTION  1970, '72, '79   coronary catherization     MOUTH SURGERY     TRABECULECTOMY Left 03/26/2015   Procedure: TRABECULECTOMY WITH [facility] ON THE LEFT EYE;  Surgeon: Roy Whitaker, MD;  Location: [facility];  Service: Ophthalmology;  Laterality: Left;   TUBAL LIGATION     Patient Active Problem List   Diagnosis Date Noted   Lateral epicondylitis, right elbow 02/09/2022   Glaucoma 02/28/2021   Hyponatremia 02/28/2021   Stage 3a chronic kidney disease ([facility]) 02/28/2021   TIA (transient ischemic attack) 10/09/2014   Dizziness, nonspecific 10/09/2014   Dizziness    Left leg numbness 03/10/2013   Chest pain on exertion  03/10/2013   HTN (hypertension) 03/10/2013    ONSET DATE: Chronic, 3-6 months    REFERRING DIAG: M77.11 (ICD-10-CM) - Lateral epicondylitis, right elbow  THERAPY DIAG:  Stiffness of right wrist, not elsewhere classified  Paresthesia of skin  Stiffness of right shoulder, not elsewhere classified  Muscle weakness (generalized)  Rationale for Evaluation and Treatment Rehabilitation    PERTINENT HISTORY: Per MD: "Right lateral epicondylitis.  Minimal provocative signs but point tender of lateral epicondyle.  Vague symptoms."  She states her top 3 concerns are: numbness in volar tips of digits 1-3 and on back of thumb slightly; tightness in neck/shoulder on right side; numbness in toes and tightness in hip on right side. She has no night symptoms.    PRECAUTIONS: none    WEIGHT BEARING RESTRICTIONS No   SUBJECTIVE:  She has been attempting self-management for 3 weeks. She returns today, states feeling better but also struggling to keep a good sleeping position. She states fingers not numb now. She sees the link between certain actions, postures and how she feels/her symptoms.   PAIN:  Are you having pain? No, only stiffness  Rating: 2/10 at rest now in lateral humerus      OBJECTIVE: (All objective assessments below are from initial evaluation on: 02/17/22 unless otherwise specified.)   HAND DOMINANCE: Right    ADLs: Overall ADLs: States decreased ability to grab, hold household objects, difficulty opening containers, performing all FMS tasks, etc.      FUNCTIONAL OUTCOME MEASURES: 04/22/22: 6.8% today!   02/25/22: Quick DASH 20% impairment     UPPER EXTREMITY ROM    Eval: neck motion significantly tighter when looking/moving to her left (limited on right side where she feels tightness). Right shoulder also mildly tighter. Right hand makes full fist and can fully oppose.    Active ROM Right eval Left eval Right 04/22/22  Shoulder flexion 126 132 139*  Shoulder abduction  ~130 ~130 142*  Shoulder internal rotation     44  Shoulder external rotation     70  Elbow flexion       Elbow extension       Wrist flexion 68 73 67  Wrist extension 48 60 59  Wrist pronation 80 80   Wrist supination 80 80   (Blank rows = not tested)     UPPER EXTREMITY MMT:      MMT Right eval Left eval  Elbow flexion 5/5 5/5  Elbow extension 5/5 5/5  Wrist flexion 4+/5    Wrist extension 4+/5    (Blank rows = not tested)   HAND FUNCTION: 04/22/22: Right grip: 41.5#  Eval: Grip strength Right: 40 lbs, Left: 30 lbs; palmar pinch: 12# right, 11# left    COORDINATION: Eval: no overt issues at eval, details can be assessed PRN   SENSATION: 04/22/22: no c/o today to begin session  Eval:  Light touch intact today, S2PD in right hand is 40m IF, 465mthumb, 34m8mF, 6mm30m  (no complaints left side)     TODAY'S TREATMENT:  04/22/22: Pt performs AROM, gripping, and strength with right arm/hand against resistance for exercise/activities as well as new measures today. Using that data, OT also reviews all of her home exercises and provides finalized recommendations including 2 new stretches as below for lateral shoulder discomfort. OT also discusses home and functional tasks with the pt and reviews goals. OT also suggests keeping hand in pillowcase to prevent putting behind neck habit. Pt states understanding plan and doing very well.  She states being happy and able to self-manage now.   Exercises - Standing Shoulder Internal Rotation Stretch Behind Back  - 2-3 x daily - 3-5 reps - 15 sec hold - Sleeper Stretch  - 2-3 x daily - 3-5 reps - 15 hold    02/25/22: OT starts with discussion of daily tasks and ability, through use of Quick DASH assessment, and she states improvement in last week. She also states some trouble with jars, so OT supplies non-slip dycem to help with those tasks. OT also reviews initial HEP and also assigns additional upgrades to strengthening and stretches as listed  below. She demo's all back, states understanding and tolerates well.   Exercises - Seated Scapular Retraction  - 4-6 x daily - 1 sets - 10-15 reps - Seated Cervical Rotation AROM  - 4-6 x daily - 1 sets - 10-15 reps - Seated Cervical Extension AROM  - 4-6 x daily - 1 sets - 10-15 reps - Seated Cervical Sidebending AROM  - 4-6 x daily - 1 sets - 10-15 reps - Supine Chin Tuck  - 4-6 x daily - 1 sets - 10-15 reps - Wrist Prayer Stretch  - 3-4 x  daily - 3-5 reps - 15 sec hold - Seated Shoulder Row with Anchored Resistance  - 4-6 x daily - 1 sets - 10-15 reps - Seated Shoulder Flexion AAROM with Dowel  - 4-6 x daily - 1 sets - 10-15 reps - Seated Piriformis Stretch with Trunk Bend  - 4-6 x daily - 1 sets - 10-15 reps   PATIENT EDUCATION: Education details: See tx section above for details  Person educated: Patient Education method: Veterinary surgeon, Teach back, Handouts  Education comprehension: States and demonstrates understanding, Additional Education required      HOME EXERCISE PROGRAM: Access Code: [email or web address] URL: [email or web address] Date: 02/25/2022 Prepared by: Benito Mccreedy   GOALS: Goals reviewed with patient? Yes     SHORT TERM GOALS: (STG required if POC>30 days)   Pt will obtain protective or mobilizing custom orthotic. Target date: TBD, PRN Goal status: 04/22/22: D/C GOAL   2.  Pt will complete Quick DASH to increase self-awareness of functional deficits.  Target date: 02/26/22 Goal status: 02/25/22 MET   3.  Pt will demo/state understanding of initial HEP to improve pain levels and prerequisite motion. Target date: 02/26/22 Goal status: 02/25/22: MET     LONG TERM GOALS:   Pt will improve functional ability by decreased impairment per Quick DASH assessment from 20% to 10% or better, for better quality of life. Target date: 04/02/22 Goal status: 04/22/22: MET   2.  Pt will improve numbness/tingling complaints from Moderate/severe to mild or better  to have better use of hand for typing and other tasks.   Target date: 04/02/22 Goal status: 04/22/22: MET   3.  Pt will improve A/ROM in neck rotation from limited to the left to being equal bilaterally, to have functional motion and decreased possibility of nerve compression.  Target date: 04/02/22 Goal status: 04/22/22: MET       ASSESSMENT:   CLINICAL IMPRESSION: 04/22/22: She has symptoms under control now, no significant numbness, tightness or pain and new understanding of management and postures/habits and their effect on the body. D/C now       PLAN: OT FREQUENCY:  1 final visit (covers today's last visit)     OT DURATION: 3 additional weeks (through 04/22/22 to cover today's last visit)    PLANNED INTERVENTIONS: self care/ADL training, therapeutic exercise, therapeutic activity, neuromuscular re-education, manual therapy, passive range of motion, splinting, ultrasound, fluidotherapy, moist heat, cryotherapy, contrast bath, patient/family education, cognitive remediation/compensation, visual/perceptual remediation/compensation, energy conservation, and coping strategies training   RECOMMENDED OTHER SERVICES: none now    CONSULTED AND AGREED WITH PLAN OF CARE: Patient   PLAN FOR NEXT SESSION:  D/C successfully now    [employer], OTR/L, CHT 04/22/2022, 12:44 PM   OCCUPATIONAL THERAPY DISCHARGE SUMMARY  Visits from Start of Care: 3  Current functional level related to goals / functional outcomes: Pt has met all goals to satisfactory levels and is pleased with outcomes.   Remaining deficits: Pt has no more significant functional deficits or pain.   Education / Equipment: Pt has all needed materials and education. Pt understands how to continue on with self-management. See tx notes for more details.   Patient agrees to discharge due to max benefits received from outpatient occupational therapy / hand therapy at this time.   Benito Mccreedy, OTR/L, CHT 04/22/22

## 2022-05-11 DIAGNOSIS — I639 Cerebral infarction, unspecified: Secondary | ICD-10-CM | POA: Diagnosis not present

## 2022-05-11 DIAGNOSIS — R970 Elevated carcinoembryonic antigen [CEA]: Secondary | ICD-10-CM | POA: Diagnosis not present

## 2022-05-11 DIAGNOSIS — F064 Anxiety disorder due to known physiological condition: Secondary | ICD-10-CM | POA: Diagnosis not present

## 2022-05-11 DIAGNOSIS — I129 Hypertensive chronic kidney disease with stage 1 through stage 4 chronic kidney disease, or unspecified chronic kidney disease: Secondary | ICD-10-CM | POA: Diagnosis not present

## 2022-05-11 DIAGNOSIS — N189 Chronic kidney disease, unspecified: Secondary | ICD-10-CM | POA: Diagnosis not present

## 2022-05-11 DIAGNOSIS — H9193 Unspecified hearing loss, bilateral: Secondary | ICD-10-CM | POA: Diagnosis not present

## 2022-05-11 DIAGNOSIS — I10 Essential (primary) hypertension: Secondary | ICD-10-CM | POA: Diagnosis not present

## 2022-05-11 DIAGNOSIS — R590 Localized enlarged lymph nodes: Secondary | ICD-10-CM | POA: Diagnosis not present

## 2022-05-12 ENCOUNTER — Other Ambulatory Visit: Payer: Self-pay | Admitting: Family Medicine

## 2022-05-12 DIAGNOSIS — R590 Localized enlarged lymph nodes: Secondary | ICD-10-CM

## 2022-05-21 DIAGNOSIS — H401123 Primary open-angle glaucoma, left eye, severe stage: Secondary | ICD-10-CM | POA: Diagnosis not present

## 2022-05-21 DIAGNOSIS — H401133 Primary open-angle glaucoma, bilateral, severe stage: Secondary | ICD-10-CM | POA: Diagnosis not present

## 2022-05-27 ENCOUNTER — Ambulatory Visit
Admission: RE | Admit: 2022-05-27 | Discharge: 2022-05-27 | Disposition: A | Discharge status: Home / Self Care | Payer: Medicare Other | Source: Ambulatory Visit | Attending: Family Medicine | Admitting: Family Medicine

## 2022-05-27 DIAGNOSIS — R590 Localized enlarged lymph nodes: Secondary | ICD-10-CM

## 2022-05-27 MED ORDER — IOPAMIDOL (ISOVUE-300) INJECTION 61%
75.0000 mL | Freq: Once | INTRAVENOUS | Status: AC | PRN
  Administered 2022-05-27: 75 mL via INTRAVENOUS

## 2022-06-01 DIAGNOSIS — I10 Essential (primary) hypertension: Secondary | ICD-10-CM | POA: Diagnosis not present

## 2022-06-10 ENCOUNTER — Emergency Department (HOSPITAL_COMMUNITY): Payer: Medicare Other

## 2022-06-10 ENCOUNTER — Observation Stay (HOSPITAL_COMMUNITY)
Admission: EM | Admit: 2022-06-10 | Discharge: 2022-06-11 | Disposition: A | Discharge status: Home / Self Care | Payer: Medicare Other | Attending: Internal Medicine | Admitting: Internal Medicine

## 2022-06-10 ENCOUNTER — Other Ambulatory Visit: Payer: Self-pay

## 2022-06-10 DIAGNOSIS — R9439 Abnormal result of other cardiovascular function study: Principal | ICD-10-CM | POA: Diagnosis present

## 2022-06-10 DIAGNOSIS — R270 Ataxia, unspecified: Secondary | ICD-10-CM | POA: Diagnosis not present

## 2022-06-10 DIAGNOSIS — I2510 Atherosclerotic heart disease of native coronary artery without angina pectoris: Secondary | ICD-10-CM | POA: Insufficient documentation

## 2022-06-10 DIAGNOSIS — Z91040 Latex allergy status: Secondary | ICD-10-CM | POA: Diagnosis not present

## 2022-06-10 DIAGNOSIS — Z1152 Encounter for screening for COVID-19: Secondary | ICD-10-CM | POA: Diagnosis not present

## 2022-06-10 DIAGNOSIS — I10 Essential (primary) hypertension: Secondary | ICD-10-CM | POA: Diagnosis not present

## 2022-06-10 DIAGNOSIS — I129 Hypertensive chronic kidney disease with stage 1 through stage 4 chronic kidney disease, or unspecified chronic kidney disease: Secondary | ICD-10-CM | POA: Insufficient documentation

## 2022-06-10 DIAGNOSIS — Z91010 Allergy to peanuts: Secondary | ICD-10-CM | POA: Insufficient documentation

## 2022-06-10 DIAGNOSIS — R509 Fever, unspecified: Secondary | ICD-10-CM | POA: Diagnosis not present

## 2022-06-10 DIAGNOSIS — R079 Chest pain, unspecified: Secondary | ICD-10-CM | POA: Diagnosis not present

## 2022-06-10 DIAGNOSIS — R0609 Other forms of dyspnea: Secondary | ICD-10-CM | POA: Insufficient documentation

## 2022-06-10 DIAGNOSIS — Z85828 Personal history of other malignant neoplasm of skin: Secondary | ICD-10-CM | POA: Insufficient documentation

## 2022-06-10 DIAGNOSIS — R0789 Other chest pain: Secondary | ICD-10-CM | POA: Diagnosis not present

## 2022-06-10 DIAGNOSIS — R519 Headache, unspecified: Secondary | ICD-10-CM | POA: Diagnosis not present

## 2022-06-10 DIAGNOSIS — I159 Secondary hypertension, unspecified: Secondary | ICD-10-CM

## 2022-06-10 DIAGNOSIS — R42 Dizziness and giddiness: Secondary | ICD-10-CM | POA: Diagnosis not present

## 2022-06-10 DIAGNOSIS — N1831 Chronic kidney disease, stage 3a: Secondary | ICD-10-CM | POA: Diagnosis not present

## 2022-06-10 DIAGNOSIS — R072 Precordial pain: Secondary | ICD-10-CM | POA: Diagnosis present

## 2022-06-10 DIAGNOSIS — Z79899 Other long term (current) drug therapy: Secondary | ICD-10-CM | POA: Insufficient documentation

## 2022-06-10 LAB — CBC WITH DIFFERENTIAL/PLATELET
Abs Immature Granulocytes: 0.02 10*3/uL (ref 0.00–0.07)
Basophils Absolute: 0 10*3/uL (ref 0.0–0.1)
Basophils Relative: 0 %
Eosinophils Absolute: 0.1 10*3/uL (ref 0.0–0.5)
Eosinophils Relative: 1 %
HCT: 36.3 % (ref 36.0–46.0)
Hemoglobin: 12.3 g/dL (ref 12.0–15.0)
Immature Granulocytes: 0 %
Lymphocytes Relative: 22 %
Lymphs Abs: 1.2 10*3/uL (ref 0.7–4.0)
MCH: 30.7 pg (ref 26.0–34.0)
MCHC: 33.9 g/dL (ref 30.0–36.0)
MCV: 90.5 fL (ref 80.0–100.0)
Monocytes Absolute: 0.4 10*3/uL (ref 0.1–1.0)
Monocytes Relative: 7 %
Neutro Abs: 3.9 10*3/uL (ref 1.7–7.7)
Neutrophils Relative %: 70 %
Platelets: 182 10*3/uL (ref 150–400)
RBC: 4.01 MIL/uL (ref 3.87–5.11)
RDW: 13.9 % (ref 11.5–15.5)
WBC: 5.7 10*3/uL (ref 4.0–10.5)
nRBC: 0 % (ref 0.0–0.2)

## 2022-06-10 LAB — LIPASE, BLOOD: Lipase: 35 U/L (ref 11–51)

## 2022-06-10 LAB — COMPREHENSIVE METABOLIC PANEL
ALT: 16 U/L (ref 0–44)
AST: 25 U/L (ref 15–41)
Albumin: 3.6 g/dL (ref 3.5–5.0)
Alkaline Phosphatase: 67 U/L (ref 38–126)
Anion gap: 12 (ref 5–15)
BUN: 9 mg/dL (ref 8–23)
CO2: 24 mmol/L (ref 22–32)
Calcium: 9.6 mg/dL (ref 8.9–10.3)
Chloride: 105 mmol/L (ref 98–111)
Creatinine, Ser: 1.05 mg/dL — ABNORMAL HIGH (ref 0.44–1.00)
GFR, Estimated: 54 mL/min — ABNORMAL LOW (ref >60)
Glucose, Bld: 100 mg/dL — ABNORMAL HIGH (ref 70–99)
Potassium: 3.5 mmol/L (ref 3.5–5.1)
Sodium: 141 mmol/L (ref 135–145)
Total Bilirubin: 0.7 mg/dL (ref 0.3–1.2)
Total Protein: 6.9 g/dL (ref 6.5–8.1)

## 2022-06-10 LAB — TROPONIN I (HIGH SENSITIVITY): Troponin I (High Sensitivity): 7 ng/L (ref <18)

## 2022-06-10 LAB — BRAIN NATRIURETIC PEPTIDE: B Natriuretic Peptide: 99 pg/mL (ref 0.0–100.0)

## 2022-06-10 MED ORDER — HYDRALAZINE HCL 25 MG PO TABS: 25.0000 mg | ORAL_TABLET | Freq: Once | ORAL | Status: DC

## 2022-06-10 MED ORDER — HYDRALAZINE HCL 20 MG/ML IJ SOLN
10.0000 mg | Freq: Once | INTRAMUSCULAR | Status: AC
  Administered 2022-06-10: 10 mg via INTRAVENOUS
  Filled 2022-06-10: qty 1

## 2022-06-10 NOTE — ED Provider Notes (Signed)
[facility] EMERGENCY DEPARTMENT Provider Note   CSN: [id number] Arrival date & time: 06/10/22  2100     History  Chief Complaint  Patient presents with   Chest Pain    Karla Richards is a 76 y.o. female.  Karla Richards is a 76 year old female with a history of hypertension, glaucoma, CAD, sleep apnea who presents to the emergency department for chest pain.  The patient states that about 1 hour prior to arrival, around 8 PM she had the sudden onset of "pinpoint, sharp" chest pain in her left chest.  She states that this only lasted for few seconds, and then resolved on its own.  She reports that yesterday evening she received some very upsetting news, and was bit worked up about that.  This morning her blood pressure was 119 systolic, and increase up to 200.  She reports that she was recently taken off of her previous hydralazine about 1 month ago, remains on valsartan.  At 1 point earlier this morning she felt dizzy and had a headache, but that resolved.  She denies any focal weakness, numbness, dysphagia, dysarthria, or cough, but does report feeling a bit feverish earlier.  She denies any current chest pain, shortness of breath, or other symptoms at this time.  She denies any history of DVT/PE, hemoptysis, recent surgery, or leg pain or swelling.  She is worried that maybe she has COVID.   The history is provided by the patient and the EMS personnel.  Chest Pain      Home Medications Prior to Admission medications   Medication Sig Start Date End Date Taking? Authorizing Provider  b complex vitamins capsule Take 1 capsule by mouth daily.   Yes [provider]  diphenhydrAMINE (BENADRYL) 25 MG tablet Take 50 mg by mouth 2 (two) times daily as needed for allergies or itching.   Yes [provider]  famotidine (PEPCID) 10 MG tablet Take 10 mg by mouth 2 (two) times daily as needed for heartburn or indigestion.   Yes [provider]  fluoruracil (CARAC) 0.5 % cream Apply 1 application topically daily as needed (skin cancer flare up).   Yes [provider]  hydrALAZINE (APRESOLINE) 50 MG tablet Take 50 mg by mouth daily.   Yes [provider]  Multiple Vitamins-Minerals (MULTIVITAMIN ADULTS PO) Take 1 tablet by mouth daily.   Yes [provider]  tretinoin (RETIN-A) 0.05 % cream Apply 1 application topically at bedtime. 11/23/19  Yes [provider]  valsartan (DIOVAN) 80 MG tablet Take 160 mg by mouth daily. 11/21/18  Yes [provider]  ZIOPTAN 0.0015 % SOLN Place 1 drop into the left eye at bedtime. 02/12/21  Yes [provider]      Allergies    Fish allergy, Lenon Ahmadi, Mango flavor, Other, Peanuts [peanut oil], Penicillins, Shellfish allergy, Ciprofloxacin hcl, Coconut fatty acids, Fluorescein-benoxinate, Seconal [secobarbital sodium], Ace inhibitors, Alcohol-sulfur [elemental sulfur], Aspirin, Beta adrenergic blockers, Brimonidine, Caffeine, Iodine solution [povidone iodine], Latex, Milk-related compounds, Motrin [ibuprofen], Norvasc [amlodipine], and Sulfa antibiotics    Review of Systems   Review of Systems  Cardiovascular:  Positive for chest pain.    Physical Exam Updated Vital Signs BP (!) 169/84   Pulse 85   Resp (!) 23   SpO2 99%   Physical Exam Vitals and nursing note reviewed.  Constitutional:      General: She is not in acute distress.    Appearance: She is not toxic-appearing.  Cardiovascular:  Heart sounds: Normal heart sounds.  Pulmonary:     Effort: Pulmonary effort is normal.     Breath sounds: Normal breath sounds. No wheezing, rhonchi or rales.  Musculoskeletal:     Right lower leg: No tenderness. No edema.     Left lower leg: No tenderness. No edema.  Neurological:     Mental Status: She is alert.    Mental Status Exam:  Orientation: Alert and oriented to person, place and time.  Memory: Cooperative,  follows commands well. Recent and remote memory normal.  Attention, concentration: Attention span and concentration are normal.  Language: Speech is clear and language is normal.   Cranial Nerves:   CN 2 (Optic): Visual fields intact to confrontation. CN 3,4,6 (EOM): Pupils equal and reactive to light. Full extraocular eye movement without nystagmus.  CN 5 (Trigeminal): Facial sensation is normal, no weakness of masticatory muscles.  CN 7 (Facial): No facial weakness or asymmetry.  CN 8 (Auditory): Auditory acuity grossly normal.  CN 9,10 (Glossophar): The uvula is midline, the palate elevates symmetrically.  CN 11 (spinal access): Normal sternocleidomastoid and trapezius strength.  CN 12 (Hypoglossal): The tongue is midline. No atrophy or fasciculations.  Motor: Strength: 5/5 and symmetric in the upper and lower extremities, no pronation or drift. Tone: Tone and muscle bulk are normal in the upper and lower extremities.   Sensation: Intact to light touch throughout.   Gait: Routine gait unsteady, mildly to moderately ataxic requiring assistance.    ED Results / Procedures / Treatments   Labs (all labs ordered are listed, but only abnormal results are displayed) Labs Reviewed  COMPREHENSIVE METABOLIC PANEL - Abnormal; Notable for the following components:      Result Value   Glucose, Bld 100 (*)    Creatinine, Ser 1.05 (*)    GFR, Estimated 54 (*)    All other components within normal limits  RESP PANEL BY RT-PCR (FLU A&B, COVID) ARPGX2  CBC WITH DIFFERENTIAL/PLATELET  LIPASE, BLOOD  BRAIN NATRIURETIC PEPTIDE  TROPONIN I (HIGH SENSITIVITY)  TROPONIN I (HIGH SENSITIVITY)    EKG EKG Interpretation  Date/Time:  Thursday June 10 2022 21:31:02 EDT Ventricular Rate:  64 PR Interval:  147 QRS Duration: 91 QT Interval:  411 QTC Calculation: 424 R Axis:   46 Text Interpretation: Sinus rhythm Confirmed by Lennice Sites ([id number]) on 06/10/2022 9:32:48 PM  Radiology CT Head  Wo Contrast  Result Date: 06/11/2022 CLINICAL DATA:  Acute neurological deficit with stroke suspected. Hypertension and ataxia. Left-sided chest pain. EXAM: CT HEAD WITHOUT CONTRAST TECHNIQUE: Contiguous axial images were obtained from the base of the skull through the vertex without intravenous contrast. RADIATION DOSE REDUCTION: This exam was performed according to the departmental dose-optimization program which includes automated exposure control, adjustment of the mA and/or kV according to patient size and/or use of iterative reconstruction technique. COMPARISON:  CT head 07/12/2021.  MRI brain 03/01/2021 FINDINGS: Brain: Diffuse cerebral atrophy. Ventricular dilatation consistent with central atrophy. Low-attenuation changes in the deep white matter consistent with small vessel ischemia. No abnormal extra-axial fluid collections. No mass effect or midline shift. Gray-white matter junctions are distinct. Basal cisterns are not effaced. No acute intracranial hemorrhage. Vascular: No hyperdense vessel or unexpected calcification. Skull: Normal. Negative for fracture or focal lesion. Sinuses/Orbits: No acute finding. Other: None. IMPRESSION: No acute intracranial abnormalities. Chronic atrophy and small vessel ischemic changes. Electronically Signed   By: Lucienne Capers M.D.   On: 06/11/2022 00:38   DG Chest Portable 1 View  Result Date: 06/10/2022 CLINICAL DATA:  cp EXAM: PORTABLE CHEST 1 VIEW COMPARISON:  Chest x-ray 02/26/2021 FINDINGS: The heart and mediastinal contours are unchanged. No focal consolidation. No pulmonary edema. No pleural effusion. No pneumothorax. No acute osseous abnormality. IMPRESSION: No active disease. Electronically Signed   By: Iven Finn M.D.   On: 06/10/2022 22:11    Procedures Procedures    Medications Ordered in ED Medications  irbesartan (AVAPRO) tablet 150 mg (has no administration in time range)  Tafluprost (PF) 0.0015 % SOLN 1 drop (0 drops Left Eye Hold  06/11/22 0033)  multivitamin with minerals tablet 1 tablet (has no administration in time range)  hydrALAZINE (APRESOLINE) tablet 50 mg (has no administration in time range)  diphenhydrAMINE (BENADRYL) capsule 50 mg (has no administration in time range)  famotidine (PEPCID) tablet 10 mg (has no administration in time range)  acetaminophen (TYLENOL) tablet 650 mg (has no administration in time range)  ondansetron (ZOFRAN) injection 4 mg (has no administration in time range)  enoxaparin (LOVENOX) injection 40 mg (has no administration in time range)  hydrALAZINE (APRESOLINE) injection 10 mg (10 mg Intravenous Given 06/10/22 2320)    ED Course/ Medical Decision Making/ A&P Clinical Course as of 06/11/22 0047  Thu Jun 10, 2022  2313 Admit to Medicine for ACS r/o when trop back, Cards aware and will see in AM. F/u head CT, labs. [DG]    Clinical Course User Index [DG] Renard Matter, MD                           Medical Decision Making Problems Addressed: Chest pain, unspecified type: acute illness or injury that poses a threat to life or bodily functions Secondary hypertension: chronic illness or injury  Amount and/or Complexity of Data Reviewed External Data Reviewed: labs, radiology, ECG and notes. Labs: ordered. Decision-making details documented in ED Course. Radiology: ordered and independent interpretation performed. Decision-making details documented in ED Course. Discussion of management or test interpretation with external provider(s): Cardiology  Risk Prescription drug management. Decision regarding hospitalization.   MDM Ricarda Atayde is a 76 y.o. female with history of hypertension, glaucoma, CAD, sleep apnea who presents with chest pain now resolved as per above as well as hypertension. Patient reports that she was recently taken off of her hydralazine, remains on valsartan.  Baseline HTN per oupatient records appears to be ~637C systolic (during multiple  recent visits in June and July systolic blood pressure elevated to 180s).   Upon initial evaluation physical exam reveals no neurologic deficits other than ataxic gait.  Patient states that she does have some gait instability since a prior heat stroke, however this is worse than her current baseline.  On chart review, patient received carotid ultrasound on 07/04/2020 for dizziness, hypertension, and syncope.  This showed minor carotid atherosclerosis. No hemodynamically significant ICA stenosis. Degree of narrowing less than 50% bilaterally.   July 2023 stress echo showed inferior wall motion abnormalities.   DDX considered included (but not limited to): Hypertensive emergency, hypertensive urgency, ACS.  Patient did recently have cessation of one of her antihypertensive medications which I feel may be contributing, also reports some significant stressors lately.  Patient has an overall unremarkable neurologic exam, but does report headache and is noted to have an ataxic gait which she states is not quite at her baseline, will obtain head CT to evaluate for possible acute bleed/CVA.  No current chest pain to suggest ACS, however given prior abnormal  stress test will obtain troponins and EKG.  Patient does not have shortness of breath, hypoxia, or crackles on exam to suggest acute pulmonary edema.  No pulse or neurologic deficits on exam to suggest aortic dissection and patient is overall well appearing and HDS.   Plan: Will obtain labs to evaluate for end-organ damage, CXR to evaluate for pulmonary edema or mediastinal widening. No chest pain, pulse deficits, or neurologic deficits to suggest aortic dissection therefore do not feel that CTA is warranted at this time. Will obtain head CT.   Testing Results: Lab results: CBC unremarkable, no leukocytosis to suggest significant infection, hemoglobin within normal limits at 12.3.  CMP unremarkable, creatinine within normal limits at 1.05, no electrolyte  derangements, LFTs within normal limits.  Lipase within normal limits at 35.  Troponin negative at 7.  BNP elevated at 99.  Imaging results: Head CT pending.   All radiography studies, electrocardiograms, and laboratory data were personally reviewed by me and incorporated into my medical decision making.  Laboratory studies demonstrate no evidence of target organ damage (no elevation in creatinine to suggest AKI, no elevation in troponin or ischemic EKG changes to suggest type II MI, no altered mental status or neurologic deficits to suggest hypertensive encephalopathy, no hypoxia or shortness of breath to suggest pulmonary edema and chest x-ray does not demonstrate evidence of such).   Interventions: Patient given 1 dose of hydralazine 10 mg IV in emergency department.     ED Course: Patient re-evaluated. Vital signs demonstrate BP now 169/84. Patient remains asymptomatic at this time.  Disposition: Given recent abnormal stress test and chest pain, feel patient requires admission for further ACS work-up.  Cardiology consulted, recommended medicine admit and will plan to see patient in the morning. Head CT pending at time of signout. Admission process initiated.   The plan for this patient was discussed with my attending physician, who voiced agreement and who oversaw evaluation and treatment of this patient.    Note: Estate manager/land agent was used in the creation of this note. Grammatical errors may be present.          Final Clinical Impression(s) / ED Diagnoses Final diagnoses:  Chest pain, unspecified type  Secondary hypertension    Rx / DC Orders ED Discharge Orders     None         Renard Matter, MD 06/11/22 [place], [place], DO 06/12/22 1501

## 2022-06-10 NOTE — ED Triage Notes (Signed)
Pt arrived via [facility] for chest pain. Pt reported event of sharp, 8/10 pain to left chest, through to back with dizziness and shortness of breath. Chest pain resolved prior to EMS arrival and pt remains pain free at this time. Pt hypertensive initially at 230/106. Pt reports new cardiac diagnosis with changes made to blood pressure medication. Pt alert and oriented at this time. Aspirin not administered, pt reports allergy.   PTA EMS Vitals   HR 76 NS RR 16 SPO2 98% RA BP 216/102  CBG 101

## 2022-06-10 NOTE — ED Provider Notes (Signed)
I have personally seen and examined the patient. I have reviewed the documentation on PMH/FH/Soc Hx. I have discussed the plan of care with the resident and patient.  I have reviewed and agree with the resident's documentation. Please see associated encounter note.  Briefly, the patient is a 76 y.o. female here with chest pain, high blood pressure.  Patient with history of hypertension, CAD.  Patient been having brief chest pain today.  Noticed her blood pressure was high during that time.'s been having some exertional fatigue and shortness of breath and discomfort more recently.  She had a stress echocardiogram recently that she was told that was abnormal but she has not followed up with cardiology yet.  Per chart review she did have a stress echo back in July that showed some wall motion abnormality.  Overall EKG today shows sinus rhythm.  No ischemic changes.  She is chest pain-free now.  She is hypertensive in the 190s.  We will give a dose IV hydralazine.  Differential diagnosis is likely ACS versus less likely infectious process.  Have no concern for PE or dissection.  We will get a head CT given headache and high blood pressure.  However neurologically she is intact.  Per my review interpretation labs no significant anemia, electrolyte abnormality, kidney injury or leukocytosis.  Troponin within normal limits.  Talked with Dr. Shellia Carwin with cardiology who recommends admission for further cardiac work-up.  Will admit to medicine for further care.  This chart was dictated using voice recognition software.  Despite best efforts to proofread,  errors can occur which can change the documentation meaning.    EKG Interpretation  Date/Time:  Thursday June 10 2022 21:31:02 EDT Ventricular Rate:  64 PR Interval:  147 QRS Duration: 91 QT Interval:  411 QTC Calculation: 424 R Axis:   46 Text Interpretation: Sinus rhythm Confirmed by Lennice Sites ([id number]) on 06/10/2022 9:32:48 PM           Lennice Sites, DO 06/10/22 2348

## 2022-06-11 ENCOUNTER — Emergency Department (HOSPITAL_COMMUNITY): Payer: Medicare Other

## 2022-06-11 DIAGNOSIS — R9439 Abnormal result of other cardiovascular function study: Secondary | ICD-10-CM

## 2022-06-11 DIAGNOSIS — R519 Headache, unspecified: Secondary | ICD-10-CM | POA: Diagnosis not present

## 2022-06-11 DIAGNOSIS — N1831 Chronic kidney disease, stage 3a: Secondary | ICD-10-CM | POA: Diagnosis not present

## 2022-06-11 DIAGNOSIS — R079 Chest pain, unspecified: Secondary | ICD-10-CM | POA: Diagnosis not present

## 2022-06-11 DIAGNOSIS — I10 Essential (primary) hypertension: Secondary | ICD-10-CM | POA: Diagnosis not present

## 2022-06-11 DIAGNOSIS — I159 Secondary hypertension, unspecified: Secondary | ICD-10-CM

## 2022-06-11 LAB — TROPONIN I (HIGH SENSITIVITY): Troponin I (High Sensitivity): 8 ng/L (ref <18)

## 2022-06-11 LAB — RESP PANEL BY RT-PCR (FLU A&B, COVID) ARPGX2
Influenza A by PCR: NEGATIVE
Influenza B by PCR: NEGATIVE
SARS Coronavirus 2 by RT PCR: NEGATIVE

## 2022-06-11 MED ORDER — PANTOPRAZOLE SODIUM 40 MG PO TBEC: 40.0000 mg | DELAYED_RELEASE_TABLET | Freq: Every day | ORAL | 1 refills | Status: DC

## 2022-06-11 MED ORDER — TAFLUPROST (PF) 0.0015 % OP SOLN: 1.0000 [drp] | Freq: Every day | OPHTHALMIC | Status: DC

## 2022-06-11 MED ORDER — ISOSORBIDE MONONITRATE ER 60 MG PO TB24: 60.0000 mg | ORAL_TABLET | Freq: Every day | ORAL | 3 refills | Status: DC

## 2022-06-11 MED ORDER — ISOSORBIDE MONONITRATE ER 30 MG PO TB24
60.0000 mg | ORAL_TABLET | Freq: Every day | ORAL | Status: DC
  Administered 2022-06-11: 60 mg via ORAL
  Filled 2022-06-11: qty 2

## 2022-06-11 MED ORDER — FAMOTIDINE 20 MG PO TABS: 10.0000 mg | ORAL_TABLET | Freq: Two times a day (BID) | ORAL | Status: DC | PRN

## 2022-06-11 MED ORDER — ACETAMINOPHEN 325 MG PO TABS
650.0000 mg | ORAL_TABLET | ORAL | Status: DC | PRN
  Administered 2022-06-11: 650 mg via ORAL
  Filled 2022-06-11: qty 2

## 2022-06-11 MED ORDER — ONDANSETRON HCL 4 MG/2ML IJ SOLN: 4.0000 mg | Freq: Four times a day (QID) | INTRAMUSCULAR | Status: DC | PRN

## 2022-06-11 MED ORDER — ADULT MULTIVITAMIN W/MINERALS CH
1.0000 | ORAL_TABLET | Freq: Every day | ORAL | Status: DC
  Administered 2022-06-11: 1 via ORAL
  Filled 2022-06-11: qty 1

## 2022-06-11 MED ORDER — HYDRALAZINE HCL 25 MG PO TABS
50.0000 mg | ORAL_TABLET | Freq: Every day | ORAL | Status: DC
  Administered 2022-06-11: 50 mg via ORAL
  Filled 2022-06-11: qty 2

## 2022-06-11 MED ORDER — ASPIRIN 81 MG PO TBEC: 81.0000 mg | DELAYED_RELEASE_TABLET | Freq: Every day | ORAL | 1 refills | Status: DC

## 2022-06-11 MED ORDER — ENOXAPARIN SODIUM 40 MG/0.4ML IJ SOSY
40.0000 mg | PREFILLED_SYRINGE | INTRAMUSCULAR | Status: DC
  Administered 2022-06-11: 40 mg via SUBCUTANEOUS
  Filled 2022-06-11: qty 0.4

## 2022-06-11 MED ORDER — HYDRALAZINE HCL 20 MG/ML IJ SOLN: 5.0000 mg | INTRAMUSCULAR | Status: DC | PRN

## 2022-06-11 MED ORDER — DIPHENHYDRAMINE HCL 25 MG PO CAPS: 50.0000 mg | ORAL_CAPSULE | Freq: Two times a day (BID) | ORAL | Status: DC | PRN

## 2022-06-11 MED ORDER — IRBESARTAN 300 MG PO TABS
150.0000 mg | ORAL_TABLET | Freq: Every day | ORAL | Status: DC
  Administered 2022-06-11: 150 mg via ORAL
  Filled 2022-06-11: qty 1

## 2022-06-11 NOTE — Assessment & Plan Note (Addendum)
Pt with CP today, neg trops. But had positive stress test in July with reversible inferior wall hypokinesis during stress. Also increased DOE over past few months. 1. NPO 2. Cards consulted 3. I suspect next step is likely just LHC 1. Creat / BUN looks good for this. 2. She DOES have IVC dye allergy though and likely will need pre-medication.
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
## 2022-06-11 NOTE — Progress Notes (Signed)
[facility] PROGRESS NOTE    Progress Note  Karla Richards  XYI:[id number] DOB: [birth year] DOA: 06/10/2022 PCP: Lucianne Lei, MD     Brief Narrative:   Karla Richards is an 76 y.o. female past medical history significant for essential hypertension comes in for several months history of dyspnea on exertion and fatigue had a echo stress check at [facility] that was possible for reversible inferior wall hypokinesia   Assessment/Plan:   Abnormal cardiovascular stress test Patient had chest pain today cardiac biomarkers have been negative. Twelve-lead EKG showed normal sinus rhythm no evidence of ischemia. Patient n.p.o. cardiology has been consulted will probably need a left heart cath. She has an allergy to contrast.  Chronic kidney disease stage III A: Creatinine seems to be at baseline we will have to be cautious with contrast.  Essential HTN (hypertension) Continue home meds   DVT prophylaxis: lovenox Family Communication:none Status is: Observation The patient remains OBS appropriate and will d/c before 2 midnights.    Code Status:     Code Status Orders  (From admission, onward)           Start     Ordered   06/11/22 0027  Full code  Continuous        06/11/22 0027           Code Status History     Date Active Date Inactive Code Status Order ID Comments User Context   02/28/2021 1558 03/01/2021 1653 Full Code [id number]  Karmen Bongo, MD ED   03/26/2015 1018 03/26/2015 1508 Full Code [id number]  Marylynn Pearson, MD Inpatient   10/09/2014 2342 10/10/2014 1811 Full Code [id number]  Velvet Bathe, MD Inpatient   03/10/2013 0014 03/11/2013 1921 Full Code [id number]  Etta Quill., DO ED         IV Access:   Peripheral IV   Procedures and diagnostic studies:   CT Head Wo Contrast  Result Date: 06/11/2022 CLINICAL DATA:  Acute neurological deficit with stroke suspected. Hypertension and ataxia. Left-sided chest pain. EXAM: CT HEAD  WITHOUT CONTRAST TECHNIQUE: Contiguous axial images were obtained from the base of the skull through the vertex without intravenous contrast. RADIATION DOSE REDUCTION: This exam was performed according to the departmental dose-optimization program which includes automated exposure control, adjustment of the mA and/or kV according to patient size and/or use of iterative reconstruction technique. COMPARISON:  CT head 07/12/2021.  MRI brain 03/01/2021 FINDINGS: Brain: Diffuse cerebral atrophy. Ventricular dilatation consistent with central atrophy. Low-attenuation changes in the deep white matter consistent with small vessel ischemia. No abnormal extra-axial fluid collections. No mass effect or midline shift. Gray-white matter junctions are distinct. Basal cisterns are not effaced. No acute intracranial hemorrhage. Vascular: No hyperdense vessel or unexpected calcification. Skull: Normal. Negative for fracture or focal lesion. Sinuses/Orbits: No acute finding. Other: None. IMPRESSION: No acute intracranial abnormalities. Chronic atrophy and small vessel ischemic changes. Electronically Signed   By: Lucienne Capers M.D.   On: 06/11/2022 00:38   DG Chest Portable 1 View  Result Date: 06/10/2022 CLINICAL DATA:  cp EXAM: PORTABLE CHEST 1 VIEW COMPARISON:  Chest x-ray 02/26/2021 FINDINGS: The heart and mediastinal contours are unchanged. No focal consolidation. No pulmonary edema. No pleural effusion. No pneumothorax. No acute osseous abnormality. IMPRESSION: No active disease. Electronically Signed   By: Iven Finn M.D.   On: 06/10/2022 22:11     Medical Consultants:   None.   Subjective:    [name] she denies any chest pain today denies  any shortness of breath  Objective:    Vitals:   06/11/22 0259 06/11/22 0352 06/11/22 0430 06/11/22 0500  BP:  139/79 132/68 135/72  Pulse:  76 68 68  Resp:  18 (!) 21 20  Temp: 98 F (36.7 C) 97.8 F (36.6 C)    TempSrc:  Oral    SpO2:   99% 97% 96%   SpO2: 96 %  No intake or output data in the 24 hours ending 06/11/22 0646 There were no vitals filed for this visit.  Exam: General exam: In no acute distress. Respiratory system: Good air movement and clear to auscultation. Cardiovascular system: S1 & S2 heard, RRR.  Gastrointestinal system: Abdomen is nondistended, soft and nontender.  Extremities: No pedal edema. Skin: No rashes, lesions or ulcers Psychiatry: Judgement and insight appear normal. Mood & affect appropriate.    Data Reviewed:    Labs: Basic Metabolic Panel: Recent Labs  Lab 06/10/22 2231  NA 141  K 3.5  CL 105  CO2 24  GLUCOSE 100*  BUN 9  CREATININE 1.05*  CALCIUM 9.6   GFR CrCl cannot be calculated (Unknown ideal weight.). Liver Function Tests: Recent Labs  Lab 06/10/22 2231  AST 25  ALT 16  ALKPHOS 67  BILITOT 0.7  PROT 6.9  ALBUMIN 3.6   Recent Labs  Lab 06/10/22 2231  LIPASE 35   No results for input(s): "AMMONIA" in the last 168 hours. Coagulation profile No results for input(s): "INR", "PROTIME" in the last 168 hours. COVID-19 Labs  No results for input(s): "DDIMER", "FERRITIN", "LDH", "CRP" in the last 72 hours.  Lab Results  Component Value Date   SARSCOV2NAA NEGATIVE 06/10/2022   [place] NEGATIVE 02/27/2021   [place] Not Detected 04/08/2020    CBC: Recent Labs  Lab 06/10/22 2231  WBC 5.7  NEUTROABS 3.9  HGB 12.3  HCT 36.3  MCV 90.5  PLT 182   Cardiac Enzymes: No results for input(s): "CKTOTAL", "CKMB", "CKMBINDEX", "TROPONINI" in the last 168 hours. BNP (last 3 results) No results for input(s): "PROBNP" in the last 8760 hours. CBG: No results for input(s): "GLUCAP" in the last 168 hours. D-Dimer: No results for input(s): "DDIMER" in the last 72 hours. Hgb A1c: No results for input(s): "HGBA1C" in the last 72 hours. Lipid Profile: No results for input(s): "CHOL", "HDL", "LDLCALC", "TRIG", "CHOLHDL", "LDLDIRECT" in the last 72  hours. Thyroid function studies: No results for input(s): "TSH", "T4TOTAL", "T3FREE", "THYROIDAB" in the last 72 hours.  Invalid input(s): "FREET3" Anemia work up: No results for input(s): "VITAMINB12", "FOLATE", "FERRITIN", "TIBC", "IRON", "RETICCTPCT" in the last 72 hours. Sepsis Labs: Recent Labs  Lab 06/10/22 2231  WBC 5.7   Microbiology Recent Results (from the past 240 hour(s))  Resp Panel by RT-PCR (Flu A&B, Covid) Anterior Nasal Swab     Status: None   Collection Time: 06/10/22  9:30 PM   Specimen: Anterior Nasal Swab  Result Value Ref Range Status   SARS Coronavirus 2 by RT PCR NEGATIVE NEGATIVE Final    Comment: (NOTE) SARS-CoV-2 target nucleic acids are NOT DETECTED.  The SARS-CoV-2 RNA is generally detectable in upper respiratory specimens during the acute phase of infection. The lowest concentration of SARS-CoV-2 viral copies this assay can detect is 138 copies/mL. A negative result does not preclude SARS-Cov-2 infection and should not be used as the sole basis for treatment or other patient management decisions. A negative result may occur with  improper specimen collection/handling, submission of specimen other than nasopharyngeal swab,  presence of viral mutation(s) within the areas targeted by this assay, and inadequate number of viral copies(<138 copies/mL). A negative result must be combined with clinical observations, patient history, and epidemiological information. The expected result is Negative.  Fact Sheet for Patients:  EntrepreneurPulse.com.au  Fact Sheet for Healthcare Providers:  IncredibleEmployment.be  This test is no t yet approved or cleared by the [place] FDA and  has been authorized for detection and/or diagnosis of SARS-CoV-2 by FDA under an Emergency Use Authorization (EUA). This EUA will remain  in effect (meaning this test can be used) for the duration of the COVID-19 declaration under Section  564(b)(1) of the Act, 21 U.S.C.section 360bbb-3(b)(1), unless the authorization is terminated  or revoked sooner.       Influenza A by PCR NEGATIVE NEGATIVE Final   Influenza B by PCR NEGATIVE NEGATIVE Final    Comment: (NOTE) The Xpert Xpress SARS-CoV-2/FLU/RSV plus assay is intended as an aid in the diagnosis of influenza from Nasopharyngeal swab specimens and should not be used as a sole basis for treatment. Nasal washings and aspirates are unacceptable for Xpert Xpress SARS-CoV-2/FLU/RSV testing.  Fact Sheet for Patients: EntrepreneurPulse.com.au  Fact Sheet for Healthcare Providers: IncredibleEmployment.be  This test is not yet approved or cleared by the [place] FDA and has been authorized for detection and/or diagnosis of SARS-CoV-2 by FDA under an Emergency Use Authorization (EUA). This EUA will remain in effect (meaning this test can be used) for the duration of the COVID-19 declaration under Section 564(b)(1) of the Act, 21 U.S.C. section 360bbb-3(b)(1), unless the authorization is terminated or revoked.  Performed at [facility], [place] [address], [place], [place] [address]      Medications:    enoxaparin (LOVENOX) injection  40 mg Subcutaneous Q24H   hydrALAZINE  50 mg Oral Daily   irbesartan  150 mg Oral Daily   multivitamin with minerals  1 tablet Oral Daily   Tafluprost (PF)  1 drop Left Eye QHS   Continuous Infusions:    LOS: 0 days   Charlynne Cousins  [facility]  06/11/2022, 6:46 AM

## 2022-06-11 NOTE — Assessment & Plan Note (Addendum)
Cont home BP meds PRN hydralazine IV if needed.

## 2022-06-11 NOTE — Discharge Summary (Signed)
Physician Discharge Summary  Karla Richards OYD:[id number] DOB: 21-Oct-1945 DOA: 06/10/2022  PCP: Lucianne Lei, MD  Admit date: 06/10/2022 Discharge date: 06/11/2022  Admitted From: Home  Disposition:  Home   Recommendations for Outpatient Follow-up:  Follow up with Cards in 1-2 weeks Please obtain BMP/CBC in one week   Home Health:No  Equipment/Devices:None   Discharge Condition:Stable  CODE STATUS:Full  Diet recommendation: Heart Healthy  Brief/Interim Summary: 76 y.o. female past medical history significant for essential hypertension comes in for several months history of dyspnea on exertion and fatigue had a echo stress check at [facility] that was possible for reversible inferior wall hypokinesia    Discharge Diagnoses:  Principal Problem:   Abnormal cardiovascular stress test Active Problems:   HTN (hypertension)   Stage 3a chronic kidney disease ([place])  Abnormal cardiac stress test with new onset chest pain: Patient had chest pain today cardiac biomarkers remain flat twelve-lead EKG shows sinus rhythm with no evidence of ischemia. Cardiology was consulted the patient was chest pain-free, they recommended med management as the patient preferred to follow-up at [facility]. The added Imdur and aspirin.  Chronic kidney stage IIIa: Creatinine at baseline.  Essential hypertension: No changes made to her medication, cardiology recommended Imdur.      Discharge Instructions  Discharge Instructions     Diet - low sodium heart healthy   Complete by: As directed    Increase activity slowly   Complete by: As directed       Allergies as of 06/11/2022       Reactions   Fish Allergy Anaphylaxis   Justicia Adhatoda Anaphylaxis   Mango Flavor Anaphylaxis   Other Anaphylaxis   Tree nuts   Peanuts [peanut Oil] Anaphylaxis   Penicillins Anaphylaxis   Did it involve swelling of the face/tongue/throat, SOB, or low BP? Yes Did it involve sudden or severe  rash/hives, skin peeling, or any reaction on the inside of your mouth or nose? No Did you need to seek medical attention at a hospital or doctor's office? No When did it last happen?    Several Years Ago   If all above answers are "NO", may proceed with cephalosporin use.   Shellfish Allergy Swelling   Ciprofloxacin Hcl Other (See Comments)   Other reaction(s): Other Pt can take pill but no the drops Pt can take pill but no the drops   Coconut Fatty Acids    Other reaction(s): Other, Unknown   Fluorescein-benoxinate    Other reaction(s): Other Eye irritation (lid swelling, itching)   Seconal [secobarbital Sodium] Other (See Comments)   Pt. felt like she was on fire, states her Mom told she passed out   Ace Inhibitors Hives   Alcohol-sulfur [elemental Sulfur] Other (See Comments)   Sleepy   Aspirin Other (See Comments)   Abdominal irritation   Beta Adrenergic Blockers Hives   Brimonidine Itching, Other (See Comments)   Puffy eyes - Alphagan    Caffeine Other (See Comments)   Jittery, feels nervous   Iodine Solution [povidone Iodine] Itching, Other (See Comments)   Topical solution caused severe itching   Latex Itching   Milk-related Compounds Other (See Comments)   Fluid in the ear and dizziness    Motrin [ibuprofen] Other (See Comments)   Upset stomach   Norvasc [amlodipine] Hives, Swelling   Sulfa Antibiotics Other (See Comments)   Can take with Benadryl        Medication List     TAKE these medications    aspirin  EC 81 MG tablet Take 1 tablet (81 mg total) by mouth daily. Swallow whole.   b complex vitamins capsule Take 1 capsule by mouth daily.   diphenhydrAMINE 25 MG tablet Commonly known as: BENADRYL Take 50 mg by mouth 2 (two) times daily as needed for allergies or itching.   famotidine 10 MG tablet Commonly known as: PEPCID Take 10 mg by mouth 2 (two) times daily as needed for heartburn or indigestion.   fluoruracil 0.5 % cream Commonly known as:  CARAC Apply 1 application topically daily as needed (skin cancer flare up).   hydrALAZINE 50 MG tablet Commonly known as: APRESOLINE Take 50 mg by mouth daily.   isosorbide mononitrate 60 MG 24 hr tablet Commonly known as: IMDUR Take 1 tablet (60 mg total) by mouth daily. Start taking on: June 12, 2022   MULTIVITAMIN ADULTS PO Take 1 tablet by mouth daily.   pantoprazole 40 MG tablet Commonly known as: Protonix Take 1 tablet (40 mg total) by mouth daily.   tretinoin 0.05 % cream Commonly known as: RETIN-A Apply 1 application topically at bedtime.   valsartan 80 MG tablet Commonly known as: DIOVAN Take 160 mg by mouth daily.   Zioptan 0.0015 % Soln Generic drug: Tafluprost (PF) Place 1 drop into the left eye at bedtime.        Allergies  Allergen Reactions   Fish Allergy Anaphylaxis   Justicia Adhatoda Anaphylaxis   Mango Flavor Anaphylaxis   Other Anaphylaxis    Tree nuts   Peanuts [Peanut Oil] Anaphylaxis   Penicillins Anaphylaxis    Did it involve swelling of the face/tongue/throat, SOB, or low BP? Yes Did it involve sudden or severe rash/hives, skin peeling, or any reaction on the inside of your mouth or nose? No Did you need to seek medical attention at a hospital or doctor's office? No When did it last happen?    Several Years Ago   If all above answers are "NO", may proceed with cephalosporin use.     Shellfish Allergy Swelling   Ciprofloxacin Hcl Other (See Comments)    Other reaction(s): Other Pt can take pill but no the drops Pt can take pill but no the drops    Coconut Fatty Acids     Other reaction(s): Other, Unknown   Fluorescein-Benoxinate     Other reaction(s): Other Eye irritation (lid swelling, itching)   Seconal [Secobarbital Sodium] Other (See Comments)    Pt. felt like she was on fire, states her Mom told she passed out   Ace Inhibitors Hives   Alcohol-Sulfur [Elemental Sulfur] Other (See Comments)    Sleepy   Aspirin Other (See  Comments)    Abdominal irritation   Beta Adrenergic Blockers Hives   Brimonidine Itching and Other (See Comments)    Puffy eyes - Alphagan    Caffeine Other (See Comments)    Jittery, feels nervous   Iodine Solution [Povidone Iodine] Itching and Other (See Comments)    Topical solution caused severe itching   Latex Itching   Milk-Related Compounds Other (See Comments)    Fluid in the ear and dizziness    Motrin [Ibuprofen] Other (See Comments)    Upset stomach   Norvasc [Amlodipine] Hives and Swelling   Sulfa Antibiotics Other (See Comments)    Can take with Benadryl    Consultations: Cardiology   Procedures/Studies: CT Head Wo Contrast  Result Date: 06/11/2022 CLINICAL DATA:  Acute neurological deficit with stroke suspected. Hypertension and ataxia. Left-sided chest pain.  EXAM: CT HEAD WITHOUT CONTRAST TECHNIQUE: Contiguous axial images were obtained from the base of the skull through the vertex without intravenous contrast. RADIATION DOSE REDUCTION: This exam was performed according to the departmental dose-optimization program which includes automated exposure control, adjustment of the mA and/or kV according to patient size and/or use of iterative reconstruction technique. COMPARISON:  CT head 07/12/2021.  MRI brain 03/01/2021 FINDINGS: Brain: Diffuse cerebral atrophy. Ventricular dilatation consistent with central atrophy. Low-attenuation changes in the deep white matter consistent with small vessel ischemia. No abnormal extra-axial fluid collections. No mass effect or midline shift. Gray-white matter junctions are distinct. Basal cisterns are not effaced. No acute intracranial hemorrhage. Vascular: No hyperdense vessel or unexpected calcification. Skull: Normal. Negative for fracture or focal lesion. Sinuses/Orbits: No acute finding. Other: None. IMPRESSION: No acute intracranial abnormalities. Chronic atrophy and small vessel ischemic changes. Electronically Signed   By: Lucienne Capers M.D.   On: 06/11/2022 00:38   DG Chest Portable 1 View  Result Date: 06/10/2022 CLINICAL DATA:  cp EXAM: PORTABLE CHEST 1 VIEW COMPARISON:  Chest x-ray 02/26/2021 FINDINGS: The heart and mediastinal contours are unchanged. No focal consolidation. No pulmonary edema. No pleural effusion. No pneumothorax. No acute osseous abnormality. IMPRESSION: No active disease. Electronically Signed   By: Iven Finn M.D.   On: 06/10/2022 22:11   CT SOFT TISSUE NECK W CONTRAST  Result Date: 05/27/2022 CLINICAL DATA:  Enlarged lymph nodes bilaterally. EXAM: CT NECK WITH CONTRAST TECHNIQUE: Multidetector CT imaging of the neck was performed using the standard protocol following the bolus administration of intravenous contrast. RADIATION DOSE REDUCTION: This exam was performed according to the departmental dose-optimization program which includes automated exposure control, adjustment of the mA and/or kV according to patient size and/or use of iterative reconstruction technique. Creatinine was obtained on site at [facility] at [place], [place] Results: Creatinine 0.7 mg/dL. CONTRAST:  18m ISOVUE-300 IOPAMIDOL (ISOVUE-300) INJECTION 61% COMPARISON:  None Available. FINDINGS: Pharynx and larynx: No evidence of mass or inflammation. Salivary glands: Symmetric and unremarkable. Palpable markers most closely overlie the submandibular glands. Thyroid: Normal Lymph nodes: None enlarged or heterogeneous. Vascular: Negative Limited intracranial: Negative Visualized orbits: Bilateral cataract resection Mastoids and visualized paranasal sinuses: Clear Skeleton: Ordinary cervical spine degeneration. No neural impingement. Upper chest: Few pulmonary nodules measuring up to 4 mm in the left upper lobe. IMPRESSION: 1. Negative for mass or adenopathy in the neck. Palpable markers most closely overlap the submandibular glands. 2. 4 mm left upper lobe pulmonary nodule. No follow-up needed  if patient is low-risk.This recommendation follows the consensus statement: Guidelines for Management of Incidental Pulmonary Nodules Detected on CT Images: From the Fleischner Society 2017; Radiology 2017; 284:228-243. Electronically Signed   By: JJorje GuildM.D.   On: 05/27/2022 22:03   (Echo, Carotid, EGD, Colonoscopy, ERCP)    Subjective: No complaints.  She remained chest pain and shortness of breath free.  Discharge Exam: Vitals:   06/11/22 1330 06/11/22 1400  BP: (!) 147/67 (!) 159/83  Pulse: 69 69  Resp: 18 15  Temp:    SpO2: 98% 99%   Vitals:   06/11/22 1246 06/11/22 1300 06/11/22 1330 06/11/22 1400  BP:  (!) 153/63 (!) 147/67 (!) 159/83  Pulse:  71 69 69  Resp:  (!) '27 18 15  '$ Temp: 98.1 F (36.7 C)     TempSrc: Oral     SpO2:  98% 98% 99%    General: Pt is alert, awake,  not in acute distress Cardiovascular: RRR, S1/S2 +, no rubs, no gallops Respiratory: CTA bilaterally, no wheezing, no rhonchi Abdominal: Soft, NT, ND, bowel sounds + Extremities: no edema, no cyanosis    The results of significant diagnostics from this hospitalization (including imaging, microbiology, ancillary and laboratory) are listed below for reference.     Microbiology: Recent Results (from the past 240 hour(s))  Resp Panel by RT-PCR (Flu A&B, Covid) Anterior Nasal Swab     Status: None   Collection Time: 06/10/22  9:30 PM   Specimen: Anterior Nasal Swab  Result Value Ref Range Status   SARS Coronavirus 2 by RT PCR NEGATIVE NEGATIVE Final    Comment: (NOTE) SARS-CoV-2 target nucleic acids are NOT DETECTED.  The SARS-CoV-2 RNA is generally detectable in upper respiratory specimens during the acute phase of infection. The lowest concentration of SARS-CoV-2 viral copies this assay can detect is 138 copies/mL. A negative result does not preclude SARS-Cov-2 infection and should not be used as the sole basis for treatment or other patient management decisions. A negative result may  occur with  improper specimen collection/handling, submission of specimen other than nasopharyngeal swab, presence of viral mutation(s) within the areas targeted by this assay, and inadequate number of viral copies(<138 copies/mL). A negative result must be combined with clinical observations, patient history, and epidemiological information. The expected result is Negative.  Fact Sheet for Patients:  EntrepreneurPulse.com.au  Fact Sheet for Healthcare Providers:  IncredibleEmployment.be  This test is no t yet approved or cleared by the [place] FDA and  has been authorized for detection and/or diagnosis of SARS-CoV-2 by FDA under an Emergency Use Authorization (EUA). This EUA will remain  in effect (meaning this test can be used) for the duration of the COVID-19 declaration under Section 564(b)(1) of the Act, 21 U.S.C.section 360bbb-3(b)(1), unless the authorization is terminated  or revoked sooner.       Influenza A by PCR NEGATIVE NEGATIVE Final   Influenza B by PCR NEGATIVE NEGATIVE Final    Comment: (NOTE) The Xpert Xpress SARS-CoV-2/FLU/RSV plus assay is intended as an aid in the diagnosis of influenza from Nasopharyngeal swab specimens and should not be used as a sole basis for treatment. Nasal washings and aspirates are unacceptable for Xpert Xpress SARS-CoV-2/FLU/RSV testing.  Fact Sheet for Patients: EntrepreneurPulse.com.au  Fact Sheet for Healthcare Providers: IncredibleEmployment.be  This test is not yet approved or cleared by the [place] FDA and has been authorized for detection and/or diagnosis of SARS-CoV-2 by FDA under an Emergency Use Authorization (EUA). This EUA will remain in effect (meaning this test can be used) for the duration of the COVID-19 declaration under Section 564(b)(1) of the Act, 21 U.S.C. section 360bbb-3(b)(1), unless the authorization is terminated  or revoked.  Performed at [facility], [place] [address], [place], [place] [address]      Labs: BNP (last 3 results) Recent Labs    06/10/22 2231  BNP 36.6   Basic Metabolic Panel: Recent Labs  Lab 06/10/22 2231  NA 141  K 3.5  CL 105  CO2 24  GLUCOSE 100*  BUN 9  CREATININE 1.05*  CALCIUM 9.6   Liver Function Tests: Recent Labs  Lab 06/10/22 2231  AST 25  ALT 16  ALKPHOS 67  BILITOT 0.7  PROT 6.9  ALBUMIN 3.6   Recent Labs  Lab 06/10/22 2231  LIPASE 35   No results for input(s): "AMMONIA" in the last 168 hours. CBC: Recent Labs  Lab 06/10/22  2231  WBC 5.7  NEUTROABS 3.9  HGB 12.3  HCT 36.3  MCV 90.5  PLT 182   Cardiac Enzymes: No results for input(s): "CKTOTAL", "CKMB", "CKMBINDEX", "TROPONINI" in the last 168 hours. BNP: Invalid input(s): "POCBNP" CBG: No results for input(s): "GLUCAP" in the last 168 hours. D-Dimer No results for input(s): "DDIMER" in the last 72 hours. Hgb A1c No results for input(s): "HGBA1C" in the last 72 hours. Lipid Profile No results for input(s): "CHOL", "HDL", "LDLCALC", "TRIG", "CHOLHDL", "LDLDIRECT" in the last 72 hours. Thyroid function studies No results for input(s): "TSH", "T4TOTAL", "T3FREE", "THYROIDAB" in the last 72 hours.  Invalid input(s): "FREET3" Anemia work up No results for input(s): "VITAMINB12", "FOLATE", "FERRITIN", "TIBC", "IRON", "RETICCTPCT" in the last 72 hours. Urinalysis    Component Value Date/Time   COLORURINE COLORLESS (A) 07/12/2021 1403   APPEARANCEUR CLEAR 07/12/2021 1403   LABSPEC <=1.005 02/14/2022 1744   PHURINE 6.0 02/14/2022 1744   GLUCOSEU NEGATIVE 02/14/2022 1744   HGBUR NEGATIVE 02/14/2022 1744   BILIRUBINUR NEGATIVE 02/14/2022 1744   KETONESUR NEGATIVE 02/14/2022 1744   PROTEINUR NEGATIVE 02/14/2022 1744   UROBILINOGEN 0.2 02/14/2022 1744   NITRITE NEGATIVE 02/14/2022 1744   LEUKOCYTESUR NEGATIVE 02/14/2022 1744   Sepsis Labs Recent Labs  Lab  06/10/22 2231  WBC 5.7   Microbiology Recent Results (from the past 240 hour(s))  Resp Panel by RT-PCR (Flu A&B, Covid) Anterior Nasal Swab     Status: None   Collection Time: 06/10/22  9:30 PM   Specimen: Anterior Nasal Swab  Result Value Ref Range Status   SARS Coronavirus 2 by RT PCR NEGATIVE NEGATIVE Final    Comment: (NOTE) SARS-CoV-2 target nucleic acids are NOT DETECTED.  The SARS-CoV-2 RNA is generally detectable in upper respiratory specimens during the acute phase of infection. The lowest concentration of SARS-CoV-2 viral copies this assay can detect is 138 copies/mL. A negative result does not preclude SARS-Cov-2 infection and should not be used as the sole basis for treatment or other patient management decisions. A negative result may occur with  improper specimen collection/handling, submission of specimen other than nasopharyngeal swab, presence of viral mutation(s) within the areas targeted by this assay, and inadequate number of viral copies(<138 copies/mL). A negative result must be combined with clinical observations, patient history, and epidemiological information. The expected result is Negative.  Fact Sheet for Patients:  EntrepreneurPulse.com.au  Fact Sheet for Healthcare Providers:  IncredibleEmployment.be  This test is no t yet approved or cleared by the [place] FDA and  has been authorized for detection and/or diagnosis of SARS-CoV-2 by FDA under an Emergency Use Authorization (EUA). This EUA will remain  in effect (meaning this test can be used) for the duration of the COVID-19 declaration under Section 564(b)(1) of the Act, 21 U.S.C.section 360bbb-3(b)(1), unless the authorization is terminated  or revoked sooner.       Influenza A by PCR NEGATIVE NEGATIVE Final   Influenza B by PCR NEGATIVE NEGATIVE Final    Comment: (NOTE) The Xpert Xpress SARS-CoV-2/FLU/RSV plus assay is intended as an aid in the  diagnosis of influenza from Nasopharyngeal swab specimens and should not be used as a sole basis for treatment. Nasal washings and aspirates are unacceptable for Xpert Xpress SARS-CoV-2/FLU/RSV testing.  Fact Sheet for Patients: EntrepreneurPulse.com.au  Fact Sheet for Healthcare Providers: IncredibleEmployment.be  This test is not yet approved or cleared by the [place] FDA and has been authorized for detection and/or diagnosis of SARS-CoV-2 by FDA under an  Emergency Use Authorization (EUA). This EUA will remain in effect (meaning this test can be used) for the duration of the COVID-19 declaration under Section 564(b)(1) of the Act, 21 U.S.C. section 360bbb-3(b)(1), unless the authorization is terminated or revoked.  Performed at [facility], [place] [address], [place], [place] [address]       SIGNED:   Charlynne Cousins, MD  [facility] 06/11/2022, 4:43 PM Pager   If 7PM-7AM, please contact night-coverage [email or web address] Password [email or web address]

## 2022-06-11 NOTE — Assessment & Plan Note (Signed)
Chart diagnosis, but creat today is 1.0 with a BUN of just 9.
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
## 2022-06-11 NOTE — Progress Notes (Addendum)
Medical management for probable angina. She prefers follow up with [facility]. Added Imdur 60 mg. Multiple other allergies. No further cardiac workup at this time. Full note  to follow. Cardiology signing off.   Nigel Mormon, MD Pager: [phone] Office: [phone]

## 2022-06-16 DIAGNOSIS — Z6822 Body mass index (BMI) 22.0-22.9, adult: Secondary | ICD-10-CM | POA: Diagnosis not present

## 2022-06-16 DIAGNOSIS — I10 Essential (primary) hypertension: Secondary | ICD-10-CM | POA: Diagnosis not present

## 2022-06-16 DIAGNOSIS — I509 Heart failure, unspecified: Secondary | ICD-10-CM | POA: Diagnosis not present

## 2022-06-16 DIAGNOSIS — F064 Anxiety disorder due to known physiological condition: Secondary | ICD-10-CM | POA: Diagnosis not present

## 2022-06-16 DIAGNOSIS — R079 Chest pain, unspecified: Secondary | ICD-10-CM | POA: Diagnosis not present

## 2022-06-28 DIAGNOSIS — R0789 Other chest pain: Secondary | ICD-10-CM | POA: Diagnosis not present

## 2022-06-28 DIAGNOSIS — I10 Essential (primary) hypertension: Secondary | ICD-10-CM | POA: Diagnosis not present

## 2022-07-07 DIAGNOSIS — Z23 Encounter for immunization: Secondary | ICD-10-CM | POA: Diagnosis not present

## 2022-07-13 DIAGNOSIS — I10 Essential (primary) hypertension: Secondary | ICD-10-CM | POA: Diagnosis not present

## 2022-07-19 ENCOUNTER — Encounter (HOSPITAL_COMMUNITY): Payer: Self-pay

## 2022-07-19 ENCOUNTER — Ambulatory Visit (HOSPITAL_COMMUNITY)
Admission: RE | Admit: 2022-07-19 | Discharge: 2022-07-19 | Disposition: A | Discharge status: Home / Self Care | Payer: Medicare Other | Source: Ambulatory Visit | Attending: Emergency Medicine | Admitting: Emergency Medicine

## 2022-07-19 ENCOUNTER — Other Ambulatory Visit: Payer: Self-pay

## 2022-07-19 VITALS — BP 150/72 | HR 68 | Temp 98.0°F | Resp 20

## 2022-07-19 DIAGNOSIS — H5789 Other specified disorders of eye and adnexa: Secondary | ICD-10-CM | POA: Diagnosis not present

## 2022-07-19 MED ORDER — OLOPATADINE HCL 0.1 % OP SOLN: 1.0000 [drp] | Freq: Two times a day (BID) | OPHTHALMIC | 0 refills | Status: DC

## 2022-07-19 NOTE — ED Triage Notes (Signed)
Pt reports eye lid swelling started last night. Pt denies any eye drainage.
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
## 2022-08-04 ENCOUNTER — Other Ambulatory Visit: Payer: Self-pay

## 2022-08-04 ENCOUNTER — Ambulatory Visit (HOSPITAL_COMMUNITY)
Admission: EM | Admit: 2022-08-04 | Discharge: 2022-08-04 | Disposition: A | Discharge status: Home / Self Care | Payer: Medicare Other | Attending: Emergency Medicine | Admitting: Emergency Medicine

## 2022-08-04 ENCOUNTER — Encounter (HOSPITAL_COMMUNITY): Payer: Self-pay | Admitting: Emergency Medicine

## 2022-08-04 DIAGNOSIS — H1013 Acute atopic conjunctivitis, bilateral: Secondary | ICD-10-CM | POA: Diagnosis not present

## 2022-08-04 DIAGNOSIS — L602 Onychogryphosis: Secondary | ICD-10-CM | POA: Diagnosis not present

## 2022-08-04 DIAGNOSIS — H5789 Other specified disorders of eye and adnexa: Secondary | ICD-10-CM | POA: Diagnosis not present

## 2022-08-04 MED ORDER — OLOPATADINE HCL 0.1 % OP SOLN: 1.0000 [drp] | Freq: Two times a day (BID) | OPHTHALMIC | 2 refills | Status: DC

## 2022-08-04 MED ORDER — CETIRIZINE HCL 5 MG PO TABS: 5.0000 mg | ORAL_TABLET | Freq: Every day | ORAL | 2 refills | Status: DC

## 2022-08-04 NOTE — Discharge Instructions (Signed)
I recommend the use of eye drops twice daily. Use in addition to warm compress to the eyelids, 10-15 minutes at a time, 3-4 times a day. Try adding the daily zyrtec 5 mg. This can help with itching and irritation as well. I recommend to call your eye specialist for follow up.  Toenails today are not concerning for bacterial or fungal infection. Monitor symptoms, and if needed follow with your podiatrist.
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
## 2022-08-04 NOTE — ED Triage Notes (Addendum)
"  Recurrent eye infection" involving both eyes.  Left eye worse than right. No tearing, no discharge, pain and itchiness.    Patient has used 2 meds that she has been prescribed in the past for this issue:Erythromycin ophthalmic ointment and otopatadine eye drops  Reports toenails are looking different to her.  No visible acute color changes to this nurse.  Nails appear age appropriate.  Patient is concerned there has been acute changes

## 2022-08-05 ENCOUNTER — Ambulatory Visit (HOSPITAL_COMMUNITY): Payer: Self-pay

## 2022-09-29 DIAGNOSIS — N189 Chronic kidney disease, unspecified: Secondary | ICD-10-CM | POA: Diagnosis not present

## 2022-09-29 DIAGNOSIS — I10 Essential (primary) hypertension: Secondary | ICD-10-CM | POA: Diagnosis not present

## 2022-09-29 DIAGNOSIS — F064 Anxiety disorder due to known physiological condition: Secondary | ICD-10-CM | POA: Diagnosis not present

## 2022-09-29 DIAGNOSIS — H9193 Unspecified hearing loss, bilateral: Secondary | ICD-10-CM | POA: Diagnosis not present

## 2022-09-29 DIAGNOSIS — J399 Disease of upper respiratory tract, unspecified: Secondary | ICD-10-CM | POA: Diagnosis not present

## 2022-09-29 DIAGNOSIS — I639 Cerebral infarction, unspecified: Secondary | ICD-10-CM | POA: Diagnosis not present

## 2022-10-04 DIAGNOSIS — I10 Essential (primary) hypertension: Secondary | ICD-10-CM | POA: Diagnosis not present

## 2022-10-19 DIAGNOSIS — I639 Cerebral infarction, unspecified: Secondary | ICD-10-CM | POA: Diagnosis not present

## 2022-10-19 DIAGNOSIS — I10 Essential (primary) hypertension: Secondary | ICD-10-CM | POA: Diagnosis not present

## 2022-10-19 DIAGNOSIS — Z6821 Body mass index (BMI) 21.0-21.9, adult: Secondary | ICD-10-CM | POA: Diagnosis not present

## 2022-10-19 DIAGNOSIS — K922 Gastrointestinal hemorrhage, unspecified: Secondary | ICD-10-CM | POA: Diagnosis not present

## 2022-10-21 ENCOUNTER — Encounter (HOSPITAL_COMMUNITY): Payer: Self-pay | Admitting: Emergency Medicine

## 2022-10-21 ENCOUNTER — Emergency Department (HOSPITAL_COMMUNITY)
Admission: EM | Admit: 2022-10-21 | Discharge: 2022-10-21 | Disposition: A | Discharge status: Home / Self Care | Payer: Medicare Other | Attending: Emergency Medicine | Admitting: Emergency Medicine

## 2022-10-21 ENCOUNTER — Other Ambulatory Visit: Payer: Self-pay

## 2022-10-21 DIAGNOSIS — Z85828 Personal history of other malignant neoplasm of skin: Secondary | ICD-10-CM | POA: Diagnosis not present

## 2022-10-21 DIAGNOSIS — Z79899 Other long term (current) drug therapy: Secondary | ICD-10-CM | POA: Insufficient documentation

## 2022-10-21 DIAGNOSIS — Z91010 Allergy to peanuts: Secondary | ICD-10-CM | POA: Diagnosis not present

## 2022-10-21 DIAGNOSIS — K921 Melena: Secondary | ICD-10-CM | POA: Diagnosis present

## 2022-10-21 DIAGNOSIS — Z7982 Long term (current) use of aspirin: Secondary | ICD-10-CM | POA: Diagnosis not present

## 2022-10-21 DIAGNOSIS — K922 Gastrointestinal hemorrhage, unspecified: Secondary | ICD-10-CM | POA: Diagnosis not present

## 2022-10-21 DIAGNOSIS — Z91040 Latex allergy status: Secondary | ICD-10-CM | POA: Diagnosis not present

## 2022-10-21 DIAGNOSIS — I2510 Atherosclerotic heart disease of native coronary artery without angina pectoris: Secondary | ICD-10-CM | POA: Insufficient documentation

## 2022-10-21 DIAGNOSIS — I10 Essential (primary) hypertension: Secondary | ICD-10-CM | POA: Insufficient documentation

## 2022-10-21 LAB — CBC WITH DIFFERENTIAL/PLATELET
Abs Immature Granulocytes: 0.01 10*3/uL (ref 0.00–0.07)
Basophils Absolute: 0.1 10*3/uL (ref 0.0–0.1)
Basophils Relative: 1 %
Eosinophils Absolute: 0.1 10*3/uL (ref 0.0–0.5)
Eosinophils Relative: 1 %
HCT: 37.3 % (ref 36.0–46.0)
Hemoglobin: 12.1 g/dL (ref 12.0–15.0)
Immature Granulocytes: 0 %
Lymphocytes Relative: 18 %
Lymphs Abs: 1.2 10*3/uL (ref 0.7–4.0)
MCH: 30 pg (ref 26.0–34.0)
MCHC: 32.4 g/dL (ref 30.0–36.0)
MCV: 92.3 fL (ref 80.0–100.0)
Monocytes Absolute: 0.5 10*3/uL (ref 0.1–1.0)
Monocytes Relative: 8 %
Neutro Abs: 4.7 10*3/uL (ref 1.7–7.7)
Neutrophils Relative %: 72 %
Platelets: 205 10*3/uL (ref 150–400)
RBC: 4.04 MIL/uL (ref 3.87–5.11)
RDW: 14.1 % (ref 11.5–15.5)
WBC: 6.6 10*3/uL (ref 4.0–10.5)
nRBC: 0 % (ref 0.0–0.2)

## 2022-10-21 LAB — COMPREHENSIVE METABOLIC PANEL
ALT: 17 U/L (ref 0–44)
AST: 28 U/L (ref 15–41)
Albumin: 4 g/dL (ref 3.5–5.0)
Alkaline Phosphatase: 66 U/L (ref 38–126)
Anion gap: 9 (ref 5–15)
BUN: 11 mg/dL (ref 8–23)
CO2: 23 mmol/L (ref 22–32)
Calcium: 9.8 mg/dL (ref 8.9–10.3)
Chloride: 106 mmol/L (ref 98–111)
Creatinine, Ser: 1.05 mg/dL — ABNORMAL HIGH (ref 0.44–1.00)
GFR, Estimated: 54 mL/min — ABNORMAL LOW (ref >60)
Glucose, Bld: 102 mg/dL — ABNORMAL HIGH (ref 70–99)
Potassium: 3.9 mmol/L (ref 3.5–5.1)
Sodium: 138 mmol/L (ref 135–145)
Total Bilirubin: 0.7 mg/dL (ref 0.3–1.2)
Total Protein: 7.3 g/dL (ref 6.5–8.1)

## 2022-10-21 LAB — POC OCCULT BLOOD, ED: Fecal Occult Bld: NEGATIVE

## 2022-10-21 MED ORDER — PANTOPRAZOLE SODIUM 40 MG PO TBEC: 40.0000 mg | DELAYED_RELEASE_TABLET | Freq: Every day | ORAL | 1 refills | Status: AC

## 2022-10-21 NOTE — Discharge Instructions (Addendum)
Take the pantoprazole once daily.  Follow-up with gastroenterology.
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
## 2022-10-21 NOTE — ED Triage Notes (Signed)
Pt c/o dark stools x 3 episodes since 2/27. States that she saw her pcp yesterday who did blood work, pt does not know what results were

## 2022-10-22 DIAGNOSIS — B351 Tinea unguium: Secondary | ICD-10-CM | POA: Diagnosis not present

## 2022-10-22 DIAGNOSIS — L609 Nail disorder, unspecified: Secondary | ICD-10-CM | POA: Diagnosis not present

## 2022-10-22 DIAGNOSIS — L565 Disseminated superficial actinic porokeratosis (DSAP): Secondary | ICD-10-CM | POA: Diagnosis not present

## 2022-10-22 DIAGNOSIS — M2041 Other hammer toe(s) (acquired), right foot: Secondary | ICD-10-CM | POA: Diagnosis not present

## 2022-10-22 DIAGNOSIS — M792 Neuralgia and neuritis, unspecified: Secondary | ICD-10-CM | POA: Diagnosis not present

## 2022-10-22 DIAGNOSIS — M2042 Other hammer toe(s) (acquired), left foot: Secondary | ICD-10-CM | POA: Diagnosis not present

## 2022-10-26 DIAGNOSIS — N189 Chronic kidney disease, unspecified: Secondary | ICD-10-CM | POA: Diagnosis not present

## 2022-10-26 DIAGNOSIS — K922 Gastrointestinal hemorrhage, unspecified: Secondary | ICD-10-CM | POA: Diagnosis not present

## 2022-10-26 DIAGNOSIS — M130 Polyarthritis, unspecified: Secondary | ICD-10-CM | POA: Diagnosis not present

## 2022-10-26 DIAGNOSIS — I639 Cerebral infarction, unspecified: Secondary | ICD-10-CM | POA: Diagnosis not present

## 2022-10-26 DIAGNOSIS — H9193 Unspecified hearing loss, bilateral: Secondary | ICD-10-CM | POA: Diagnosis not present

## 2022-10-26 DIAGNOSIS — I10 Essential (primary) hypertension: Secondary | ICD-10-CM | POA: Diagnosis not present

## 2022-10-28 DIAGNOSIS — L601 Onycholysis: Secondary | ICD-10-CM | POA: Diagnosis not present

## 2022-11-01 DIAGNOSIS — H401133 Primary open-angle glaucoma, bilateral, severe stage: Secondary | ICD-10-CM | POA: Diagnosis not present

## 2022-11-01 DIAGNOSIS — H3530 Unspecified macular degeneration: Secondary | ICD-10-CM | POA: Diagnosis not present

## 2022-11-01 DIAGNOSIS — H43812 Vitreous degeneration, left eye: Secondary | ICD-10-CM | POA: Diagnosis not present

## 2022-11-23 DIAGNOSIS — M792 Neuralgia and neuritis, unspecified: Secondary | ICD-10-CM | POA: Diagnosis not present

## 2022-11-23 DIAGNOSIS — M2042 Other hammer toe(s) (acquired), left foot: Secondary | ICD-10-CM | POA: Diagnosis not present

## 2022-11-23 DIAGNOSIS — M2041 Other hammer toe(s) (acquired), right foot: Secondary | ICD-10-CM | POA: Diagnosis not present

## 2022-11-23 DIAGNOSIS — L565 Disseminated superficial actinic porokeratosis (DSAP): Secondary | ICD-10-CM | POA: Diagnosis not present

## 2022-11-23 DIAGNOSIS — B351 Tinea unguium: Secondary | ICD-10-CM | POA: Diagnosis not present

## 2022-11-29 IMAGING — DX DG OS CALCIS 2+V*R*
2 series · 2 of 2 positions shown · non-contrast
Comparison: None.

CLINICAL DATA: Stepped on glass

EXAM:
RIGHT OS CALCIS - 2+ VIEW

[calcaneus axial]
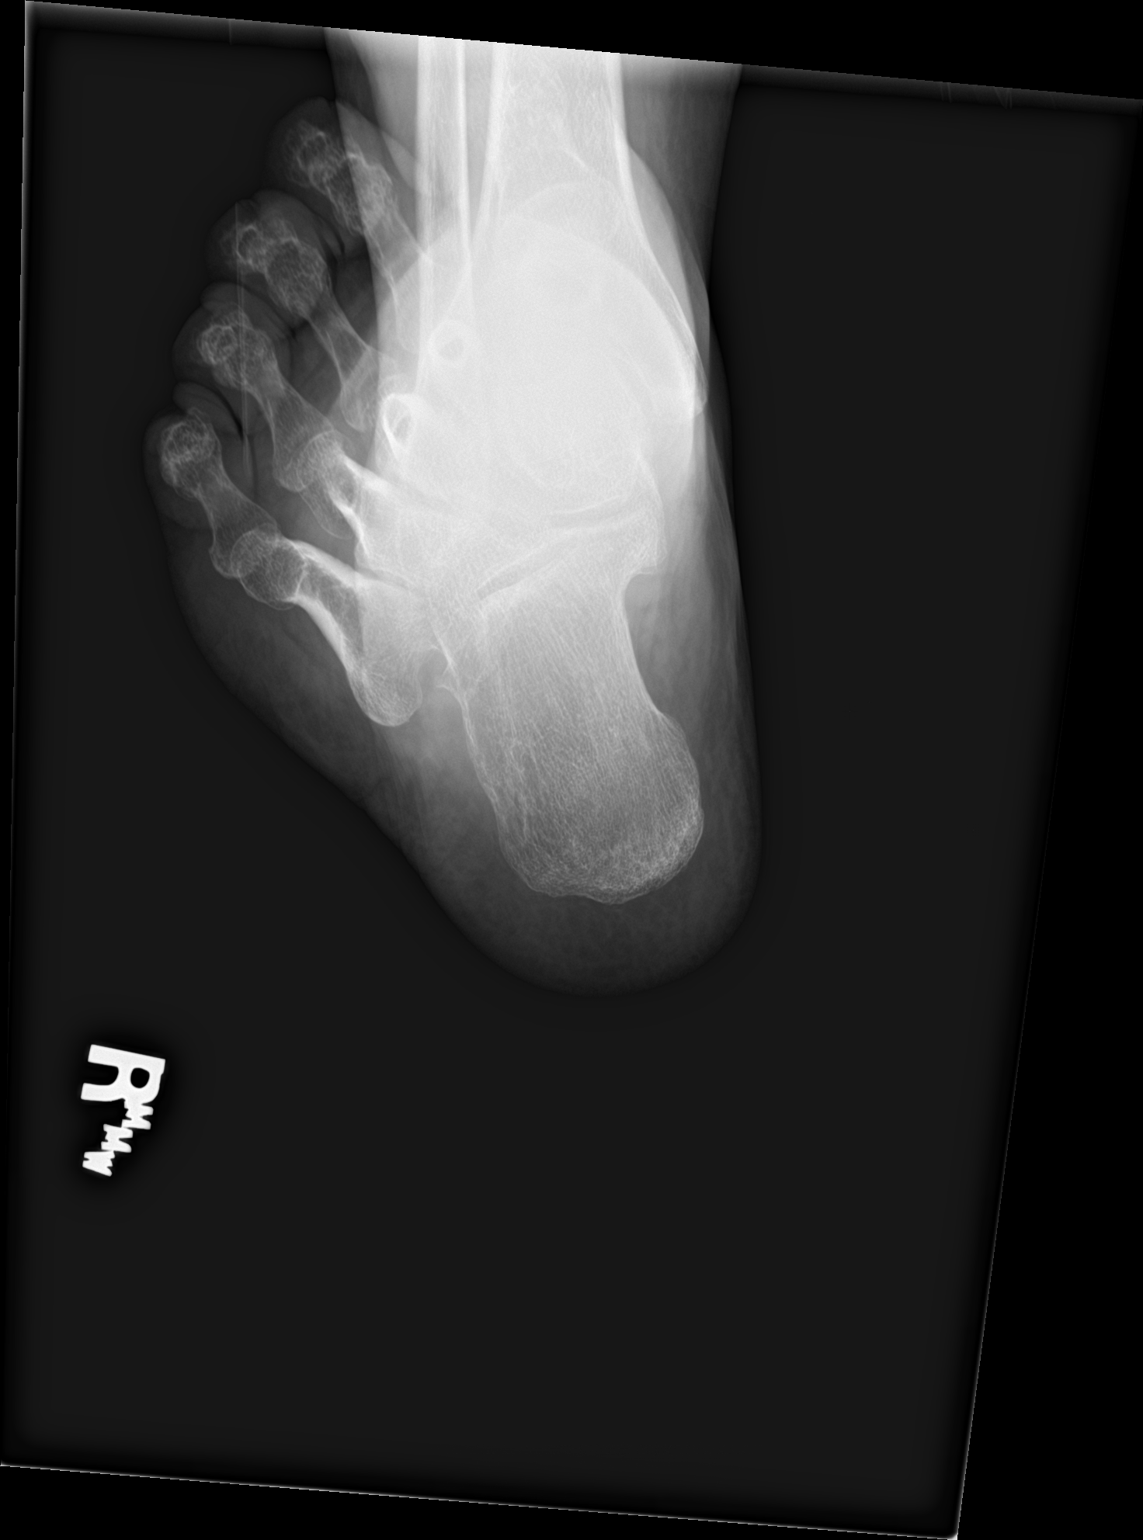

[calcaneus lat]
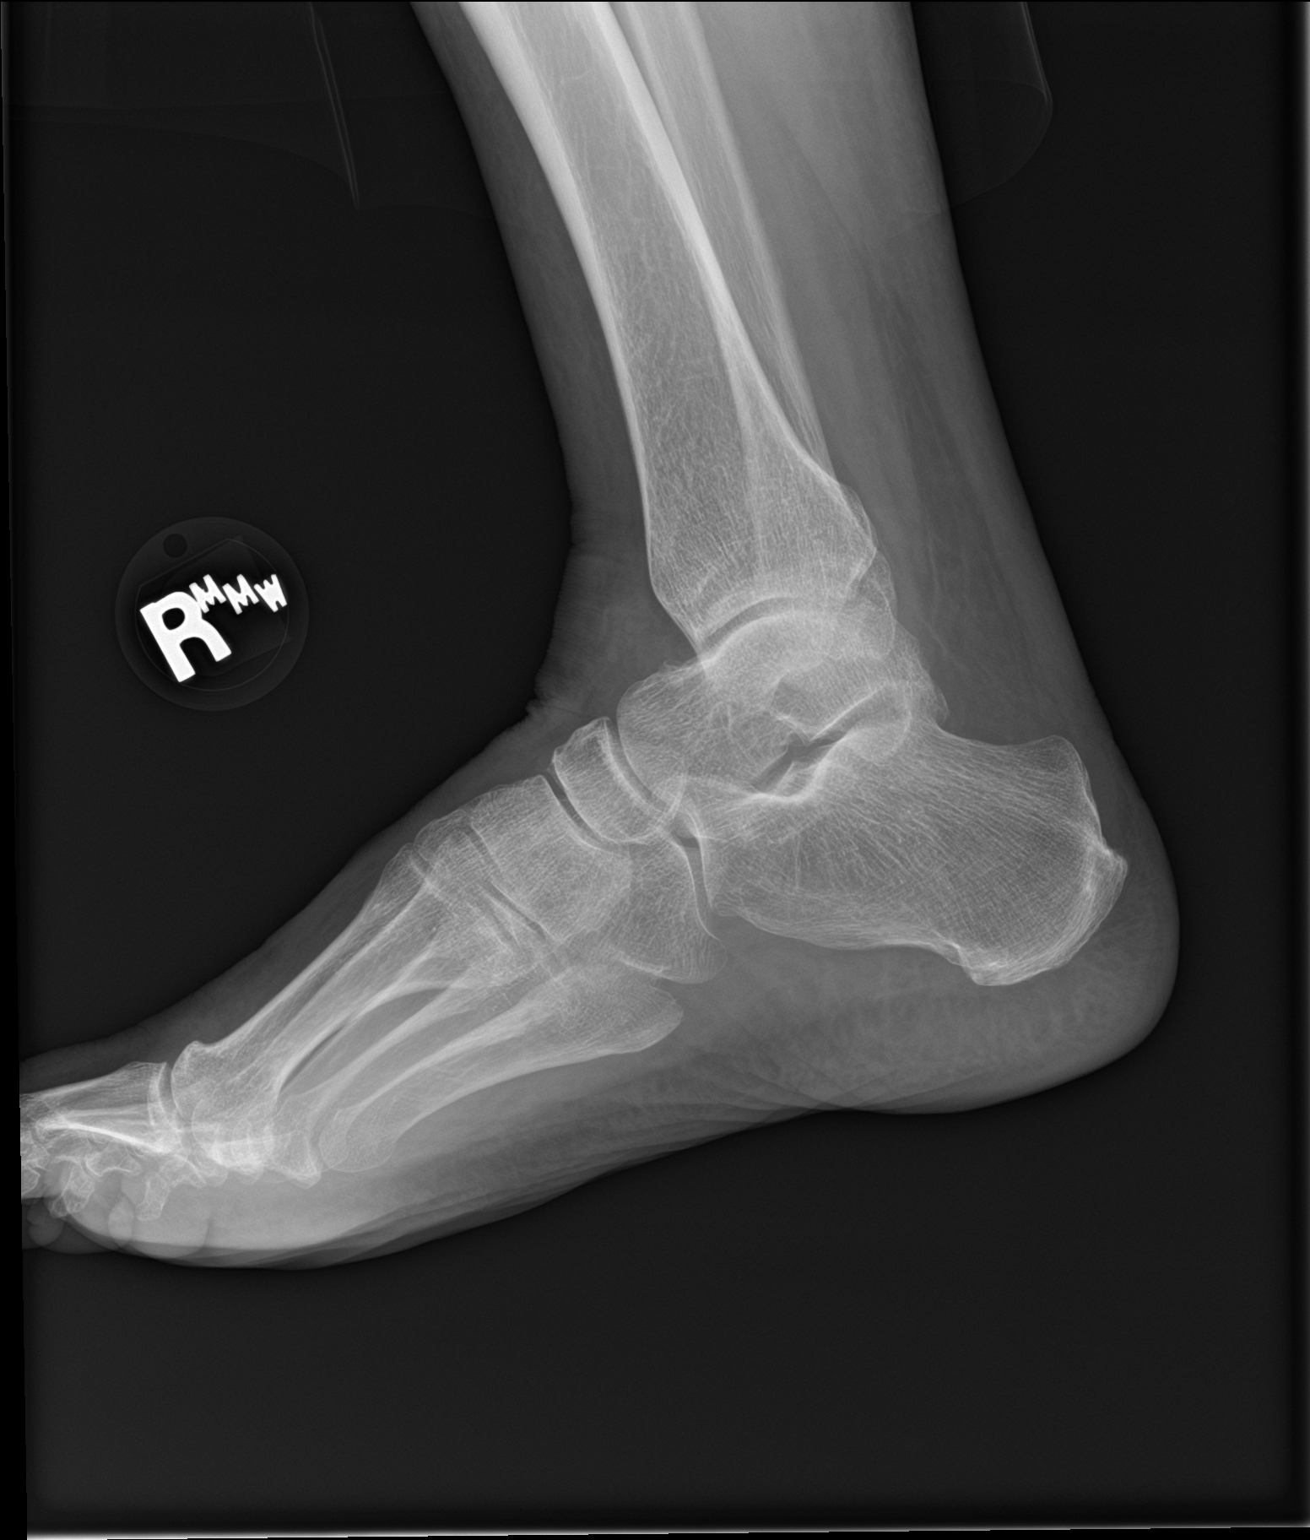

[2 of 2 positions shown; findings below may reference images not displayed]

FINDINGS: Lateral and Walter Efrain views obtained. No radiopaque foreign body or soft
tissue air. No fracture or dislocation. Joint spaces appear normal.
No erosive change.
IMPRESSION: No radiopaque foreign body evident. No fracture or dislocation. No
evident arthropathy.

## 2022-12-03 DIAGNOSIS — H3530 Unspecified macular degeneration: Secondary | ICD-10-CM | POA: Diagnosis not present

## 2022-12-03 DIAGNOSIS — H401133 Primary open-angle glaucoma, bilateral, severe stage: Secondary | ICD-10-CM | POA: Diagnosis not present

## 2022-12-03 DIAGNOSIS — H401123 Primary open-angle glaucoma, left eye, severe stage: Secondary | ICD-10-CM | POA: Diagnosis not present

## 2023-01-04 ENCOUNTER — Other Ambulatory Visit: Payer: Self-pay

## 2023-01-04 ENCOUNTER — Encounter (HOSPITAL_COMMUNITY): Payer: Self-pay | Admitting: Emergency Medicine

## 2023-01-04 ENCOUNTER — Emergency Department (HOSPITAL_COMMUNITY)
Admission: EM | Admit: 2023-01-04 | Discharge: 2023-01-04 | Disposition: A | Discharge status: Home / Self Care | Payer: Medicare Other | Attending: Emergency Medicine | Admitting: Emergency Medicine

## 2023-01-04 DIAGNOSIS — R21 Rash and other nonspecific skin eruption: Secondary | ICD-10-CM | POA: Insufficient documentation

## 2023-01-04 DIAGNOSIS — Z5321 Procedure and treatment not carried out due to patient leaving prior to being seen by health care provider: Secondary | ICD-10-CM | POA: Diagnosis not present

## 2023-01-04 NOTE — ED Notes (Signed)
Pt left lobby with husband, assumed LWBS

## 2023-01-04 NOTE — ED Triage Notes (Signed)
Pt in with feelings of allergic rxn due to Fluoroacil cream for Actinic Keratosis. Pt states she intermittently uses this cream x 1 yr when she gets flares, and has never had this reaction before. Pt does also report problems swallowing, feels like she has a "lump in her throat". No new changes in meds or lifestyle. Pt does have many medication allergies. No hives noted in triage, pt denies sob. Pt did take benadryl at 11pm for itching of outer neck, states she halved the pill and it could have gotten stuck in throat. Speaking clearly, no hoarseness.

## 2023-01-04 NOTE — ED Notes (Signed)
Pt tolerating water PO no trouble swallowing

## 2023-01-04 NOTE — ED Notes (Signed)
Called name no answer 

## 2023-01-21 DIAGNOSIS — Z91018 Allergy to other foods: Secondary | ICD-10-CM | POA: Diagnosis not present

## 2023-01-21 DIAGNOSIS — N189 Chronic kidney disease, unspecified: Secondary | ICD-10-CM | POA: Diagnosis not present

## 2023-01-21 DIAGNOSIS — I10 Essential (primary) hypertension: Secondary | ICD-10-CM | POA: Diagnosis not present

## 2023-01-21 DIAGNOSIS — I129 Hypertensive chronic kidney disease with stage 1 through stage 4 chronic kidney disease, or unspecified chronic kidney disease: Secondary | ICD-10-CM | POA: Diagnosis not present

## 2023-01-21 DIAGNOSIS — I639 Cerebral infarction, unspecified: Secondary | ICD-10-CM | POA: Diagnosis not present

## 2023-01-21 DIAGNOSIS — K922 Gastrointestinal hemorrhage, unspecified: Secondary | ICD-10-CM | POA: Diagnosis not present

## 2023-01-21 DIAGNOSIS — H9193 Unspecified hearing loss, bilateral: Secondary | ICD-10-CM | POA: Diagnosis not present

## 2023-01-21 DIAGNOSIS — Z6820 Body mass index (BMI) 20.0-20.9, adult: Secondary | ICD-10-CM | POA: Diagnosis not present

## 2023-01-21 DIAGNOSIS — F064 Anxiety disorder due to known physiological condition: Secondary | ICD-10-CM | POA: Diagnosis not present
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
## 2023-02-02 ENCOUNTER — Other Ambulatory Visit (INDEPENDENT_AMBULATORY_CARE_PROVIDER_SITE_OTHER): Payer: Medicare Other

## 2023-02-02 ENCOUNTER — Ambulatory Visit (INDEPENDENT_AMBULATORY_CARE_PROVIDER_SITE_OTHER): Payer: Medicare Other | Admitting: Gastroenterology

## 2023-02-02 ENCOUNTER — Encounter: Payer: Self-pay | Admitting: Gastroenterology

## 2023-02-02 VITALS — BP 130/72 | HR 95 | Ht 70.0 in | Wt 101.8 lb

## 2023-02-02 DIAGNOSIS — K921 Melena: Secondary | ICD-10-CM

## 2023-02-02 DIAGNOSIS — K219 Gastro-esophageal reflux disease without esophagitis: Secondary | ICD-10-CM | POA: Diagnosis not present

## 2023-02-02 LAB — HEMOGLOBIN AND HEMATOCRIT, BLOOD
HCT: 37.4 % (ref 36.0–46.0)
Hemoglobin: 12.2 g/dL (ref 12.0–15.0)

## 2023-02-02 NOTE — Patient Instructions (Signed)
Your provider has requested that you go to the basement level for lab work before leaving today. Press "B" on the elevator. The lab is located at the first door on the left as you exit the elevator.   STAY on Pantoprazole   Due to recent changes in healthcare laws, you may see the results of your imaging and laboratory studies on MyChart before your provider has had a chance to review them.  We understand that in some cases there may be results that are confusing or concerning to you. Not all laboratory results come back in the same time frame and the provider may be waiting for multiple results in order to interpret others.  Please give [place] 48 hours in order for your provider to thoroughly review all the results before contacting the office for clarification of your results.    I appreciate the  opportunity to care for you  Thank You   Marsa Aris , MD

## 2023-02-04 ENCOUNTER — Encounter: Payer: Self-pay | Admitting: Gastroenterology

## 2023-02-16 DIAGNOSIS — I10 Essential (primary) hypertension: Secondary | ICD-10-CM | POA: Diagnosis not present

## 2023-03-14 DIAGNOSIS — L821 Other seborrheic keratosis: Secondary | ICD-10-CM | POA: Diagnosis not present

## 2023-03-14 DIAGNOSIS — D229 Melanocytic nevi, unspecified: Secondary | ICD-10-CM | POA: Diagnosis not present

## 2023-03-14 DIAGNOSIS — L814 Other melanin hyperpigmentation: Secondary | ICD-10-CM | POA: Diagnosis not present

## 2023-03-14 DIAGNOSIS — D1801 Hemangioma of skin and subcutaneous tissue: Secondary | ICD-10-CM | POA: Diagnosis not present

## 2023-04-05 DIAGNOSIS — I10 Essential (primary) hypertension: Secondary | ICD-10-CM | POA: Diagnosis not present

## 2023-04-05 DIAGNOSIS — W010XXA Fall on same level from slipping, tripping and stumbling without subsequent striking against object, initial encounter: Secondary | ICD-10-CM | POA: Diagnosis not present

## 2023-04-06 ENCOUNTER — Other Ambulatory Visit: Payer: Self-pay | Admitting: Family Medicine

## 2023-04-06 ENCOUNTER — Ambulatory Visit
Admission: RE | Admit: 2023-04-06 | Discharge: 2023-04-06 | Disposition: A | Discharge status: Home / Self Care | Payer: Medicare Other | Source: Ambulatory Visit | Attending: Family Medicine | Admitting: Family Medicine

## 2023-04-06 DIAGNOSIS — W19XXXA Unspecified fall, initial encounter: Secondary | ICD-10-CM

## 2023-04-06 DIAGNOSIS — R202 Paresthesia of skin: Secondary | ICD-10-CM | POA: Diagnosis not present

## 2023-04-06 DIAGNOSIS — M25511 Pain in right shoulder: Secondary | ICD-10-CM | POA: Diagnosis not present

## 2023-04-06 DIAGNOSIS — M546 Pain in thoracic spine: Secondary | ICD-10-CM | POA: Diagnosis not present

## 2023-04-11 DIAGNOSIS — I10 Essential (primary) hypertension: Secondary | ICD-10-CM | POA: Diagnosis not present

## 2023-04-15 ENCOUNTER — Other Ambulatory Visit: Payer: Self-pay

## 2023-04-15 ENCOUNTER — Emergency Department (HOSPITAL_COMMUNITY)
Admission: EM | Admit: 2023-04-15 | Discharge: 2023-04-16 | Disposition: A | Discharge status: Home / Self Care | Payer: Medicare Other | Attending: Emergency Medicine | Admitting: Emergency Medicine

## 2023-04-15 ENCOUNTER — Emergency Department (HOSPITAL_COMMUNITY): Payer: Medicare Other

## 2023-04-15 ENCOUNTER — Encounter (HOSPITAL_COMMUNITY): Payer: Self-pay

## 2023-04-15 DIAGNOSIS — Z85828 Personal history of other malignant neoplasm of skin: Secondary | ICD-10-CM | POA: Insufficient documentation

## 2023-04-15 DIAGNOSIS — Z79899 Other long term (current) drug therapy: Secondary | ICD-10-CM | POA: Diagnosis not present

## 2023-04-15 DIAGNOSIS — Z8673 Personal history of transient ischemic attack (TIA), and cerebral infarction without residual deficits: Secondary | ICD-10-CM | POA: Diagnosis not present

## 2023-04-15 DIAGNOSIS — N1831 Chronic kidney disease, stage 3a: Secondary | ICD-10-CM | POA: Insufficient documentation

## 2023-04-15 DIAGNOSIS — Z91040 Latex allergy status: Secondary | ICD-10-CM | POA: Insufficient documentation

## 2023-04-15 DIAGNOSIS — Y92410 Unspecified street and highway as the place of occurrence of the external cause: Secondary | ICD-10-CM | POA: Diagnosis not present

## 2023-04-15 DIAGNOSIS — S0990XA Unspecified injury of head, initial encounter: Secondary | ICD-10-CM | POA: Insufficient documentation

## 2023-04-15 DIAGNOSIS — S0993XA Unspecified injury of face, initial encounter: Secondary | ICD-10-CM | POA: Diagnosis not present

## 2023-04-15 DIAGNOSIS — I2510 Atherosclerotic heart disease of native coronary artery without angina pectoris: Secondary | ICD-10-CM | POA: Insufficient documentation

## 2023-04-15 DIAGNOSIS — I129 Hypertensive chronic kidney disease with stage 1 through stage 4 chronic kidney disease, or unspecified chronic kidney disease: Secondary | ICD-10-CM | POA: Diagnosis not present

## 2023-04-15 DIAGNOSIS — S199XXA Unspecified injury of neck, initial encounter: Secondary | ICD-10-CM | POA: Diagnosis not present

## 2023-04-15 DIAGNOSIS — M47812 Spondylosis without myelopathy or radiculopathy, cervical region: Secondary | ICD-10-CM | POA: Diagnosis not present

## 2023-04-15 DIAGNOSIS — M4802 Spinal stenosis, cervical region: Secondary | ICD-10-CM | POA: Diagnosis not present
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
## 2023-04-19 DIAGNOSIS — M7581 Other shoulder lesions, right shoulder: Secondary | ICD-10-CM | POA: Diagnosis not present

## 2023-04-19 DIAGNOSIS — M5459 Other low back pain: Secondary | ICD-10-CM | POA: Diagnosis not present

## 2023-04-26 ENCOUNTER — Ambulatory Visit: Payer: Medicare Other | Attending: Family Medicine | Admitting: Physical Therapy

## 2023-04-26 DIAGNOSIS — M25611 Stiffness of right shoulder, not elsewhere classified: Secondary | ICD-10-CM | POA: Insufficient documentation

## 2023-04-26 DIAGNOSIS — M6281 Muscle weakness (generalized): Secondary | ICD-10-CM | POA: Diagnosis not present

## 2023-04-26 DIAGNOSIS — R2681 Unsteadiness on feet: Secondary | ICD-10-CM | POA: Insufficient documentation

## 2023-04-26 DIAGNOSIS — R296 Repeated falls: Secondary | ICD-10-CM | POA: Diagnosis not present

## 2023-04-26 DIAGNOSIS — M25511 Pain in right shoulder: Secondary | ICD-10-CM | POA: Diagnosis not present
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
## 2023-05-03 ENCOUNTER — Ambulatory Visit: Payer: Medicare Other | Admitting: Physical Therapy

## 2023-05-03 DIAGNOSIS — M25511 Pain in right shoulder: Secondary | ICD-10-CM | POA: Diagnosis not present

## 2023-05-03 DIAGNOSIS — R2681 Unsteadiness on feet: Secondary | ICD-10-CM | POA: Diagnosis not present

## 2023-05-03 DIAGNOSIS — M25611 Stiffness of right shoulder, not elsewhere classified: Secondary | ICD-10-CM

## 2023-05-03 DIAGNOSIS — M6281 Muscle weakness (generalized): Secondary | ICD-10-CM

## 2023-05-03 DIAGNOSIS — R296 Repeated falls: Secondary | ICD-10-CM | POA: Diagnosis not present
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
## 2023-05-10 ENCOUNTER — Ambulatory Visit: Payer: Medicare Other | Admitting: Physical Therapy

## 2023-05-10 DIAGNOSIS — M25611 Stiffness of right shoulder, not elsewhere classified: Secondary | ICD-10-CM | POA: Diagnosis not present

## 2023-05-10 DIAGNOSIS — M25511 Pain in right shoulder: Secondary | ICD-10-CM | POA: Diagnosis not present

## 2023-05-10 DIAGNOSIS — M6281 Muscle weakness (generalized): Secondary | ICD-10-CM | POA: Diagnosis not present

## 2023-05-10 DIAGNOSIS — R296 Repeated falls: Secondary | ICD-10-CM | POA: Diagnosis not present

## 2023-05-10 DIAGNOSIS — R2681 Unsteadiness on feet: Secondary | ICD-10-CM | POA: Diagnosis not present
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
## 2023-05-11 ENCOUNTER — Telehealth: Payer: Self-pay | Admitting: Physical Therapy
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
## 2023-05-17 ENCOUNTER — Ambulatory Visit: Payer: Medicare Other | Attending: Family Medicine | Admitting: Physical Therapy

## 2023-05-17 DIAGNOSIS — R296 Repeated falls: Secondary | ICD-10-CM | POA: Diagnosis not present

## 2023-05-17 DIAGNOSIS — M25511 Pain in right shoulder: Secondary | ICD-10-CM | POA: Diagnosis not present

## 2023-05-17 DIAGNOSIS — R2681 Unsteadiness on feet: Secondary | ICD-10-CM | POA: Diagnosis not present

## 2023-05-17 DIAGNOSIS — M25611 Stiffness of right shoulder, not elsewhere classified: Secondary | ICD-10-CM | POA: Diagnosis not present

## 2023-05-17 DIAGNOSIS — M5459 Other low back pain: Secondary | ICD-10-CM | POA: Diagnosis not present

## 2023-05-17 DIAGNOSIS — M6281 Muscle weakness (generalized): Secondary | ICD-10-CM | POA: Diagnosis not present

## 2023-05-17 DIAGNOSIS — M7581 Other shoulder lesions, right shoulder: Secondary | ICD-10-CM | POA: Diagnosis not present
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
## 2023-05-24 ENCOUNTER — Encounter: Payer: Self-pay | Admitting: Physical Therapy

## 2023-05-24 ENCOUNTER — Ambulatory Visit: Payer: Medicare Other | Admitting: Physical Therapy

## 2023-05-24 DIAGNOSIS — M25611 Stiffness of right shoulder, not elsewhere classified: Secondary | ICD-10-CM

## 2023-05-24 DIAGNOSIS — M6281 Muscle weakness (generalized): Secondary | ICD-10-CM | POA: Diagnosis not present

## 2023-05-24 DIAGNOSIS — R2681 Unsteadiness on feet: Secondary | ICD-10-CM | POA: Diagnosis not present

## 2023-05-24 DIAGNOSIS — R296 Repeated falls: Secondary | ICD-10-CM | POA: Diagnosis not present

## 2023-05-24 DIAGNOSIS — M25511 Pain in right shoulder: Secondary | ICD-10-CM | POA: Diagnosis not present
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
## 2023-05-31 ENCOUNTER — Ambulatory Visit: Payer: Medicare Other | Admitting: Physical Therapy

## 2023-05-31 DIAGNOSIS — R2681 Unsteadiness on feet: Secondary | ICD-10-CM | POA: Diagnosis not present

## 2023-05-31 DIAGNOSIS — R296 Repeated falls: Secondary | ICD-10-CM

## 2023-05-31 DIAGNOSIS — M25511 Pain in right shoulder: Secondary | ICD-10-CM

## 2023-05-31 DIAGNOSIS — M25611 Stiffness of right shoulder, not elsewhere classified: Secondary | ICD-10-CM

## 2023-05-31 DIAGNOSIS — M6281 Muscle weakness (generalized): Secondary | ICD-10-CM
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
## 2023-06-07 ENCOUNTER — Ambulatory Visit: Payer: Medicare Other

## 2023-06-14 ENCOUNTER — Ambulatory Visit: Payer: Medicare Other | Admitting: Physical Therapy

## 2023-06-14 DIAGNOSIS — M25511 Pain in right shoulder: Secondary | ICD-10-CM | POA: Diagnosis not present

## 2023-06-14 DIAGNOSIS — M25611 Stiffness of right shoulder, not elsewhere classified: Secondary | ICD-10-CM | POA: Diagnosis not present

## 2023-06-14 DIAGNOSIS — M6281 Muscle weakness (generalized): Secondary | ICD-10-CM | POA: Diagnosis not present

## 2023-06-14 DIAGNOSIS — R2681 Unsteadiness on feet: Secondary | ICD-10-CM | POA: Diagnosis not present

## 2023-06-14 DIAGNOSIS — R296 Repeated falls: Secondary | ICD-10-CM | POA: Diagnosis not present
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
## 2023-06-21 DIAGNOSIS — M7581 Other shoulder lesions, right shoulder: Secondary | ICD-10-CM | POA: Diagnosis not present

## 2023-07-27 DIAGNOSIS — N760 Acute vaginitis: Secondary | ICD-10-CM | POA: Diagnosis not present

## 2023-07-27 DIAGNOSIS — B3731 Acute candidiasis of vulva and vagina: Secondary | ICD-10-CM | POA: Diagnosis not present

## 2023-08-12 DIAGNOSIS — H401133 Primary open-angle glaucoma, bilateral, severe stage: Secondary | ICD-10-CM | POA: Diagnosis not present

## 2023-08-12 DIAGNOSIS — H401123 Primary open-angle glaucoma, left eye, severe stage: Secondary | ICD-10-CM | POA: Diagnosis not present

## 2023-08-16 DIAGNOSIS — N771 Vaginitis, vulvitis and vulvovaginitis in diseases classified elsewhere: Secondary | ICD-10-CM | POA: Diagnosis not present

## 2023-08-23 ENCOUNTER — Other Ambulatory Visit: Payer: Self-pay | Admitting: Obstetrics and Gynecology

## 2023-08-23 DIAGNOSIS — Z01411 Encounter for gynecological examination (general) (routine) with abnormal findings: Secondary | ICD-10-CM | POA: Diagnosis not present

## 2023-08-23 DIAGNOSIS — E2839 Other primary ovarian failure: Secondary | ICD-10-CM

## 2023-08-23 DIAGNOSIS — Z01419 Encounter for gynecological examination (general) (routine) without abnormal findings: Secondary | ICD-10-CM | POA: Diagnosis not present

## 2023-08-23 DIAGNOSIS — B3731 Acute candidiasis of vulva and vagina: Secondary | ICD-10-CM | POA: Diagnosis not present

## 2023-08-23 DIAGNOSIS — Z124 Encounter for screening for malignant neoplasm of cervix: Secondary | ICD-10-CM | POA: Diagnosis not present

## 2023-08-23 DIAGNOSIS — Z779 Other contact with and (suspected) exposures hazardous to health: Secondary | ICD-10-CM | POA: Diagnosis not present

## 2023-08-23 DIAGNOSIS — Z1331 Encounter for screening for depression: Secondary | ICD-10-CM | POA: Diagnosis not present

## 2023-08-23 DIAGNOSIS — Z1231 Encounter for screening mammogram for malignant neoplasm of breast: Secondary | ICD-10-CM | POA: Diagnosis not present

## 2023-08-31 ENCOUNTER — Ambulatory Visit
Admission: RE | Admit: 2023-08-31 | Discharge: 2023-08-31 | Disposition: A | Discharge status: Home / Self Care | Payer: Medicare Other | Source: Ambulatory Visit | Attending: Obstetrics and Gynecology | Admitting: Obstetrics and Gynecology

## 2023-08-31 DIAGNOSIS — E2839 Other primary ovarian failure: Secondary | ICD-10-CM

## 2023-08-31 DIAGNOSIS — N958 Other specified menopausal and perimenopausal disorders: Secondary | ICD-10-CM | POA: Diagnosis not present

## 2023-08-31 DIAGNOSIS — M8588 Other specified disorders of bone density and structure, other site: Secondary | ICD-10-CM | POA: Diagnosis not present

## 2023-09-27 DIAGNOSIS — L821 Other seborrheic keratosis: Secondary | ICD-10-CM | POA: Diagnosis not present

## 2023-09-27 DIAGNOSIS — D485 Neoplasm of uncertain behavior of skin: Secondary | ICD-10-CM | POA: Diagnosis not present

## 2023-09-27 DIAGNOSIS — K148 Other diseases of tongue: Secondary | ICD-10-CM | POA: Diagnosis not present

## 2023-09-27 DIAGNOSIS — L700 Acne vulgaris: Secondary | ICD-10-CM | POA: Diagnosis not present

## 2023-09-27 DIAGNOSIS — D225 Melanocytic nevi of trunk: Secondary | ICD-10-CM | POA: Diagnosis not present

## 2023-09-28 ENCOUNTER — Encounter: Payer: Self-pay | Admitting: Physical Therapy

## 2023-10-02 ENCOUNTER — Ambulatory Visit (HOSPITAL_COMMUNITY): Payer: Self-pay

## 2023-10-04 DIAGNOSIS — I129 Hypertensive chronic kidney disease with stage 1 through stage 4 chronic kidney disease, or unspecified chronic kidney disease: Secondary | ICD-10-CM | POA: Diagnosis not present

## 2023-10-04 DIAGNOSIS — M818 Other osteoporosis without current pathological fracture: Secondary | ICD-10-CM | POA: Diagnosis not present

## 2023-10-04 DIAGNOSIS — H00034 Abscess of left upper eyelid: Secondary | ICD-10-CM | POA: Diagnosis not present

## 2023-10-04 DIAGNOSIS — N189 Chronic kidney disease, unspecified: Secondary | ICD-10-CM | POA: Diagnosis not present

## 2023-10-04 DIAGNOSIS — H1032 Unspecified acute conjunctivitis, left eye: Secondary | ICD-10-CM | POA: Diagnosis not present

## 2023-10-10 DIAGNOSIS — Z23 Encounter for immunization: Secondary | ICD-10-CM | POA: Diagnosis not present

## 2023-10-16 ENCOUNTER — Encounter (HOSPITAL_COMMUNITY): Payer: Self-pay

## 2023-10-16 ENCOUNTER — Ambulatory Visit (HOSPITAL_COMMUNITY)
Admission: EM | Admit: 2023-10-16 | Discharge: 2023-10-16 | Disposition: A | Discharge status: Home / Self Care | Attending: Emergency Medicine | Admitting: Emergency Medicine

## 2023-10-16 DIAGNOSIS — Z20822 Contact with and (suspected) exposure to covid-19: Secondary | ICD-10-CM | POA: Diagnosis not present

## 2023-10-16 HISTORY — DX: Age-related osteoporosis without current pathological fracture: M81.0

## 2023-10-16 LAB — COMPREHENSIVE METABOLIC PANEL
ALT: 12 U/L (ref 0–44)
AST: 19 U/L (ref 15–41)
Albumin: 3.9 g/dL (ref 3.5–5.0)
Alkaline Phosphatase: 53 U/L (ref 38–126)
Anion gap: 7 (ref 5–15)
BUN: 12 mg/dL (ref 8–23)
CO2: 27 mmol/L (ref 22–32)
Calcium: 9.7 mg/dL (ref 8.9–10.3)
Chloride: 101 mmol/L (ref 98–111)
Creatinine, Ser: 1.08 mg/dL — ABNORMAL HIGH (ref 0.44–1.00)
GFR, Estimated: 52 mL/min — ABNORMAL LOW (ref >60)
Glucose, Bld: 95 mg/dL (ref 70–99)
Potassium: 4.2 mmol/L (ref 3.5–5.1)
Sodium: 135 mmol/L (ref 135–145)
Total Bilirubin: 0.6 mg/dL (ref 0.0–1.2)
Total Protein: 7.2 g/dL (ref 6.5–8.1)

## 2023-10-16 LAB — POC COVID19/FLU A&B COMBO
Covid Antigen, POC: NEGATIVE
Influenza A Antigen, POC: NEGATIVE
Influenza B Antigen, POC: NEGATIVE

## 2023-10-16 NOTE — Discharge Instructions (Addendum)
 Your rapid test for COVID and flu were negative, we are doing a send out COVID swab we will contact you if it is positive.  Your blood pressure was elevated today, please follow-up with your primary care provider regarding this.  Return to clinic for new or urgent symptoms.

## 2023-10-16 NOTE — ED Triage Notes (Signed)
 Pt states that she wants to be tested for covid. Pt states that she was exposed to covid. Pt denies any symptoms at the time. Pt states that he pcp wanted her to be tested due to her medical history.
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
## 2023-10-17 LAB — SARS CORONAVIRUS 2 (TAT 6-24 HRS): SARS Coronavirus 2: NEGATIVE

## 2023-10-21 ENCOUNTER — Ambulatory Visit (HOSPITAL_COMMUNITY)
Admission: EM | Admit: 2023-10-21 | Discharge: 2023-10-21 | Disposition: A | Discharge status: Home / Self Care | Attending: Family Medicine | Admitting: Family Medicine

## 2023-10-21 ENCOUNTER — Encounter (HOSPITAL_COMMUNITY): Payer: Self-pay

## 2023-10-21 DIAGNOSIS — J069 Acute upper respiratory infection, unspecified: Secondary | ICD-10-CM | POA: Diagnosis not present

## 2023-10-21 DIAGNOSIS — Z20822 Contact with and (suspected) exposure to covid-19: Secondary | ICD-10-CM | POA: Insufficient documentation

## 2023-10-21 LAB — POC COVID19/FLU A&B COMBO
Covid Antigen, POC: NEGATIVE
Influenza A Antigen, POC: NEGATIVE
Influenza B Antigen, POC: NEGATIVE
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
## 2023-10-21 NOTE — ED Triage Notes (Signed)
 Pt states taking care of her husband with positive covid. States woke up today feeling bad. C/o cough, runny nose, body aches, and fatigue. States took hydralazine at 12:30.

## 2023-10-21 NOTE — Discharge Instructions (Addendum)
 You were seen today for not feeling well.  Your rapid flu/covid were negative.  We have done a sent out covid swab.  This will be resulted tomorrow, and if positive you should be notified for treatment.  Please get plenty of rest and fluids.  Take tylenol for pain and body aches as needed.  Return if not improving or worsening.

## 2023-10-23 LAB — SARS CORONAVIRUS 2 (TAT 6-24 HRS): SARS Coronavirus 2: NEGATIVE

## 2023-11-06 ENCOUNTER — Ambulatory Visit (HOSPITAL_COMMUNITY)
Admission: EM | Admit: 2023-11-06 | Discharge: 2023-11-06 | Disposition: A | Discharge status: Home / Self Care | Attending: Emergency Medicine | Admitting: Emergency Medicine

## 2023-11-06 ENCOUNTER — Other Ambulatory Visit: Payer: Self-pay

## 2023-11-06 ENCOUNTER — Encounter (HOSPITAL_COMMUNITY): Payer: Self-pay | Admitting: Emergency Medicine

## 2023-11-06 ENCOUNTER — Ambulatory Visit (INDEPENDENT_AMBULATORY_CARE_PROVIDER_SITE_OTHER)

## 2023-11-06 DIAGNOSIS — H66001 Acute suppurative otitis media without spontaneous rupture of ear drum, right ear: Secondary | ICD-10-CM

## 2023-11-06 DIAGNOSIS — R058 Other specified cough: Secondary | ICD-10-CM | POA: Diagnosis not present

## 2023-11-06 DIAGNOSIS — R0602 Shortness of breath: Secondary | ICD-10-CM

## 2023-11-06 DIAGNOSIS — Z20822 Contact with and (suspected) exposure to covid-19: Secondary | ICD-10-CM | POA: Diagnosis not present

## 2023-11-06 MED ORDER — DOXYCYCLINE HYCLATE 100 MG PO TABS: 100.0000 mg | ORAL_TABLET | Freq: Two times a day (BID) | ORAL | 0 refills | Status: AC

## 2023-11-06 NOTE — ED Triage Notes (Addendum)
 Pt report increase fatigue and persistent productive cough since the last visit she had 10/21/2023. Pt states her husband is Covid + and she is taking care of him.

## 2023-11-06 NOTE — Discharge Instructions (Addendum)
 Your chest x-ray was not concerning for pneumonia.  You do appear to have hyperinflation of your lungs which may be why you are feeling more short of breath than usual.  It is reassuring that your oxygen saturation today was 99% which is excellent.  If you continue to feel short of breath, recommend you follow-up with your primary care provider to discuss further evaluation of your hyperinflated lungs.  There are good options for treatment if your symptoms persist.  In the meantime, I believe that your symptoms will just need to run their course.  To manage your productive cough, if you tolerate guaifenesin (Robitussin, Mucinex), I recommend that you take 400 mg to 3 times daily to keep your sputum thin and easier to cough up.  To treat the infection in your right inner ear, I have provided you with a prescription for doxycycline that you will take twice daily for the next 10 days.  Please be sure that you complete the entire prescription for best results.  Doxycycline is to similar to the other antibiotics on your allergy list and is safe for you to take.  The most common side effect of doxycycline is increased sensitivity to sunlight so if you plan on being outside, please be sure that you wear sunscreen or keep your skin covered while you are taking this medication.  Doxycycline is best taken with food but avoid taking doxycycline with foods that contain calcium such as yogurt, milk, ice cream, cheese, etc.  Given your symptoms of fatigue, headache and feeling short of breath, I think it is entirely possible that you have had a COVID-19 infection but simply have not tested positive with the nasal swab test.  The nasal swab test result, unfortunately, is only reliable if the test result is positive.  A negative nasal swab test result is not reliable.  It is widely documented that many patients with COVID-19 can have persistently negative COVID-19 tests and when these patients are tested later for presence of  antibodies to COVID-19, they have tested positive.  Please do not hesitate to reach out to [place] again if your symptoms have worsened while you are waiting to be seen by your primary care provider.  Thank you for visiting [facility] today.  We appreciate the opportunity to precipitate your care.

## 2023-11-06 NOTE — ED Provider Notes (Signed)
 [facility]    CSN: [id number] Arrival date & time: 11/06/23  1004    HISTORY   Chief Complaint  Patient presents with   Fatigue   Cough   HPI Karla Richards is a pleasant, 78 y.o. female who presents to urgent care today. Pt report increased fatigue and persistent cough since 10/21/2023.  States for the past few days she has began to feel more fatigued, is having shortness of breath and her cough, which initially was nonproductive has become productive of dark-colored sputum.  States husband was dx with COVID-19 last month, she has continued to test negative for COVID-19.  Pt has elevated BP and resp on arrival, SPO2 99%.  Patient states she takes hydralazine and valsartan for high blood pressure, states she took both this morning, reports a known history of whitecoat syndrome.  Patient endorses a history of allergies to fruits, vegetables and fabrics, states she takes Zyrtec daily and Benadryl as needed for breakthrough symptoms.  Patient denies history of COPD, previous episodes of pneumonia.  Patient states her husband was diagnosed with COVID-19 pneumonia earlier this month, wants to make sure she does not have pneumonia.  The history is provided by the patient.   Past Medical History:  Diagnosis Date   Arthritis    hands, but reports that she is still active    Cancer (HCC) 2019   skin cancer   Complication of anesthesia    states 44 yrs. ago she was given "Seconal" for childbirth  & she was passed out & felt like she was on fire   Coronary artery disease    Genital herpes    Glaucoma    bilateral    Hearing difficulty    bilateral, Left worse than R , no aids yet    Hypertension    Osteoporosis    Sleep apnea    tested negative but wakes up gasping for air in her sleep; does not qualify for CPAP   Patient Active Problem List   Diagnosis Date Noted   Abnormal cardiovascular stress test 06/11/2022   Lateral epicondylitis, right elbow 02/09/2022    Glaucoma 02/28/2021   Hyponatremia 02/28/2021   Stage 3a chronic kidney disease (HCC) 02/28/2021   TIA (transient ischemic attack) 10/09/2014   Dizziness, nonspecific 10/09/2014   Dizziness    Left leg numbness 03/10/2013   Chest pain on exertion 03/10/2013   HTN (hypertension) 03/10/2013   Past Surgical History:  Procedure Laterality Date   CESAREAN SECTION  1970, '72, '79   coronary catherization     MOUTH SURGERY     TRABECULECTOMY Left 03/26/2015   Procedure: TRABECULECTOMY WITH [facility] ON THE LEFT EYE;  Surgeon: Chalmers Guest, MD;  Location: [facility] OR;  Service: Ophthalmology;  Laterality: Left;   TUBAL LIGATION     OB History   No obstetric history on file.    Home Medications    Prior to Admission medications   Medication Sig Start Date End Date Taking? Authorizing Provider  b complex vitamins capsule Take 1 capsule by mouth daily.    [provider]  cetirizine (ZYRTEC) 5 MG tablet Take 1 tablet (5 mg total) by mouth daily. 08/04/22   Rising, Lurena Joiner, PA-C  diphenhydrAMINE (BENADRYL) 25 MG tablet Take 50 mg by mouth 2 (two) times daily as needed for allergies or itching.    [provider]  famotidine (PEPCID) 10 MG tablet Take 10 mg by mouth 2 (two) times daily as needed for heartburn or indigestion.  [provider]  fluoruracil (CARAC) 0.5 % cream Apply 1 application topically daily as needed (skin cancer flare up).    [provider]  hydrALAZINE (APRESOLINE) 50 MG tablet Take 50 mg by mouth daily.    [provider]  isosorbide mononitrate (IMDUR) 60 MG 24 hr tablet Take 1 tablet (60 mg total) by mouth daily. 06/12/22   Marinda Elk, MD  Multiple Vitamins-Minerals (MULTIVITAMIN ADULTS PO) Take 1 tablet by mouth daily.    [provider]  olopatadine (PATANOL) 0.1 % ophthalmic solution Place 1 drop into both eyes 2 (two) times daily. 08/04/22   Rising, Lurena Joiner, PA-C  pantoprazole (PROTONIX) 40 MG tablet Take 1  tablet (40 mg total) by mouth daily. 10/21/22   Benjiman Core, MD  tretinoin (RETIN-A) 0.05 % cream Apply 1 application topically at bedtime. 11/23/19   [provider]  valsartan (DIOVAN) 80 MG tablet Take 160 mg by mouth daily. 11/21/18   [provider]  ZIOPTAN 0.0015 % SOLN Place 1 drop into the left eye at bedtime. 02/12/21   [provider]    Family History Family History  Problem Relation Age of Onset   Heart attack Mother 66   Stomach cancer Sister    Leukemia Sister    Stroke Maternal Grandmother 71   Colon cancer Maternal Uncle    Rectal cancer Maternal Uncle    Esophageal cancer Neg Hx    Social History Social History   Tobacco Use   Smoking status: Never   Smokeless tobacco: Never  Vaping Use   Vaping status: Never Used  Substance Use Topics   Alcohol use: No   Drug use: No   Allergies   Fish allergy, Justicia adhatoda, Mango flavoring agent (non-screening), Other, Peanuts [peanut oil], Penicillins, Shellfish allergy, Ciprofloxacin hcl, Coconut fatty acid, Fluorescein-benoxinate, Seconal [secobarbital sodium], Turmeric, Ace inhibitors, Alcohol-sulfur [elemental sulfur], Aspirin, Beta adrenergic blockers, Brimonidine, Caffeine, Iodine solution [povidone iodine], Latex, Milk-related compounds, Motrin [ibuprofen], Norvasc [amlodipine], and Sulfa antibiotics  Review of Systems Review of Systems Pertinent findings revealed after performing a 14 point review of systems has been noted in the history of present illness.  Physical Exam Vital Signs BP (!) 178/81 (BP Location: Right Arm)   Pulse 100   Temp 97.7 F (36.5 C) (Oral)   Resp (!) 22   SpO2 99%   No data found.  Physical Exam Vitals and nursing note reviewed.  Constitutional:      General: She is not in acute distress.    Appearance: Normal appearance. She is not ill-appearing.  HENT:     Head: Normocephalic and atraumatic.     Salivary Glands: Right salivary gland is not  diffusely enlarged or tender. Left salivary gland is not diffusely enlarged or tender.     Right Ear: Ear canal and external ear normal. Decreased hearing noted. No drainage. A middle ear effusion is present. There is no impacted cerumen. Tympanic membrane is injected and erythematous. Tympanic membrane is not bulging.     Left Ear: Hearing, tympanic membrane, ear canal and external ear normal. No drainage.  No middle ear effusion. There is no impacted cerumen. Tympanic membrane is not injected, erythematous or bulging.     Nose: Mucosal edema and congestion present. No nasal deformity, septal deviation or rhinorrhea.     Right Turbinates: Not enlarged, swollen or pale.     Left Turbinates: Not enlarged, swollen or pale.     Right Sinus: No maxillary sinus tenderness or frontal  sinus tenderness.     Left Sinus: No maxillary sinus tenderness or frontal sinus tenderness.     Mouth/Throat:     Lips: Pink. No lesions.     Mouth: Mucous membranes are moist. No oral lesions.     Pharynx: Oropharynx is clear. Uvula midline. Postnasal drip present. No pharyngeal swelling, oropharyngeal exudate, posterior oropharyngeal erythema or uvula swelling.     Tonsils: No tonsillar exudate. 0 on the right. 0 on the left.  Eyes:     General: Lids are normal.        Right eye: No discharge.        Left eye: No discharge.     Extraocular Movements: Extraocular movements intact.     Conjunctiva/sclera: Conjunctivae normal.     Right eye: Right conjunctiva is not injected.     Left eye: Left conjunctiva is not injected.  Neck:     Trachea: Trachea and phonation normal.  Cardiovascular:     Rate and Rhythm: Normal rate and regular rhythm.     Pulses: Normal pulses.     Heart sounds: Normal heart sounds. No murmur heard.    No friction rub. No gallop.  Pulmonary:     Effort: Pulmonary effort is normal. No tachypnea, bradypnea, accessory muscle usage, prolonged expiration or respiratory distress.     Breath  sounds: No stridor, decreased air movement or transmitted upper airway sounds. Examination of the left-lower field reveals decreased breath sounds. Decreased breath sounds present. No wheezing, rhonchi or rales.  Chest:     Chest wall: No tenderness.  Musculoskeletal:        General: Normal range of motion.     Cervical back: Normal range of motion and neck supple. Normal range of motion.  Lymphadenopathy:     Cervical: No cervical adenopathy.  Skin:    General: Skin is warm and dry.     Findings: No erythema or rash.  Neurological:     General: No focal deficit present.     Mental Status: She is alert and oriented to person, place, and time.  Psychiatric:        Mood and Affect: Mood normal.        Behavior: Behavior normal.     Visual Acuity Right Eye Distance:   Left Eye Distance:   Bilateral Distance:    Right Eye Near:   Left Eye Near:    Bilateral Near:     UC Couse / Diagnostics / Procedures:     Radiology DG Chest 2 View Result Date: 11/06/2023 CLINICAL DATA:  Decreased breath sounds and left lower lobe. Cough. Shortness of breath with exertion. EXAM: CHEST - 2 VIEW COMPARISON:  06/10/2022. FINDINGS: Trachea is midline. Heart size normal. Lungs are hyperinflated but clear. No pleural fluid. Degenerative changes in the spine. IMPRESSION: Hyperinflation without acute finding. Electronically Signed   By: Leanna Battles M.D.   On: 11/06/2023 11:06    Procedures Procedures (including critical care time) EKG  Pending results:  Labs Reviewed - No data to display  Medications Ordered in UC: Medications - No data to display  UC Diagnoses / Final Clinical Impressions(s)   I have reviewed the triage vital signs and the nursing notes.  Pertinent labs & imaging results that were available during my care of the patient were reviewed by me and considered in my medical decision making (see chart for details).    Final diagnoses:  SOB (shortness of breath)  Acute  suppurative otitis media of  right ear without spontaneous rupture of tympanic membrane, recurrence not specified  Exposure to COVID-19 virus  Productive cough   Patient advised chest x-ray concerning for hyperinflation but negative for pneumonia.  Patient provided with a printout of her final report.  Given duration of symptoms, COVID-19 testing not indicated at this time.  Patient advised.  Patient advised to begin guaifenesin 400 mg 2-3 times daily to help improve sputum production.  Patient advised to follow-up with PCP regarding lung hyperinflation, particularly if her symptoms of shortness of breath and fatigue persist or worsen.  Patient provided with doxycycline for treatment of acute otitis media in her right inner ear given her reported history of anaphylaxis when exposed to penicillin and age related contraindication to fluoroquinolones.  Conservative care recommended.  Return precautions advised.  Please see discharge instructions below for details of plan of care as provided to patient. ED Prescriptions     Medication Sig Dispense Auth. Provider   doxycycline (VIBRA-TABS) 100 MG tablet Take 1 tablet (100 mg total) by mouth 2 (two) times daily for 10 days. 20 tablet Theadora Rama Scales, PA-C      PDMP not reviewed this encounter.  Pending results:  Labs Reviewed - No data to display    Discharge Instructions      Your chest x-ray was not concerning for pneumonia.  You do appear to have hyperinflation of your lungs which may be why you are feeling more short of breath than usual.  It is reassuring that your oxygen saturation today was 99% which is excellent.  If you continue to feel short of breath, recommend you follow-up with your primary care provider to discuss further evaluation of your hyperinflated lungs.  There are good options for treatment if your symptoms persist.  In the meantime, I believe that your symptoms will just need to run their course.  To manage your  productive cough, if you tolerate guaifenesin (Robitussin, Mucinex), I recommend that you take 400 mg to 3 times daily to keep your sputum thin and easier to cough up.  To treat the infection in your right inner ear, I have provided you with a prescription for doxycycline that you will take twice daily for the next 10 days.  Please be sure that you complete the entire prescription for best results.  Doxycycline is to similar to the other antibiotics on your allergy list and is safe for you to take.  The most common side effect of doxycycline is increased sensitivity to sunlight so if you plan on being outside, please be sure that you wear sunscreen or keep your skin covered while you are taking this medication.  Doxycycline is best taken with food but avoid taking doxycycline with foods that contain calcium such as yogurt, milk, ice cream, cheese, etc.  Given your symptoms of fatigue, headache and feeling short of breath, I think it is entirely possible that you have had a COVID-19 infection but simply have not tested positive with the nasal swab test.  The nasal swab test result, unfortunately, is only reliable if the test result is positive.  A negative nasal swab test result is not reliable.  It is widely documented that many patients with COVID-19 can have persistently negative COVID-19 tests and when these patients are tested later for presence of antibodies to COVID-19, they have tested positive.  Please do not hesitate to reach out to [place] again if your symptoms have worsened while you are waiting to be seen by your primary care  provider.  Thank you for visiting [facility] today.  We appreciate the opportunity to precipitate your care.           Disposition Upon Discharge:  Condition: stable for discharge home  Patient presented with an acute illness with associated systemic symptoms and significant discomfort requiring urgent management. In my opinion, this is a condition that  a prudent lay person (someone who possesses an average knowledge of health and medicine) may potentially expect to result in complications if not addressed urgently such as respiratory distress, impairment of bodily function or dysfunction of bodily organs.   Routine symptom specific, illness specific and/or disease specific instructions were discussed with the patient and/or caregiver at length.   As such, the patient has been evaluated and assessed, work-up was performed and treatment was provided in alignment with urgent care protocols and evidence based medicine.  Patient/parent/caregiver has been advised that the patient may require follow up for further testing and treatment if the symptoms continue in spite of treatment, as clinically indicated and appropriate.  Patient/parent/caregiver has been advised to return to the [facility] or PCP if no better; to PCP or the Emergency Department if new signs and symptoms develop, or if the current signs or symptoms continue to change or worsen for further workup, evaluation and treatment as clinically indicated and appropriate  The patient will follow up with their current PCP if and as advised. If the patient does not currently have a PCP we will assist them in obtaining one.   The patient may need specialty follow up if the symptoms continue, in spite of conservative treatment and management, for further workup, evaluation, consultation and treatment as clinically indicated and appropriate.  Patient/parent/caregiver verbalized understanding and agreement of plan as discussed.  All questions were addressed during visit.  Please see discharge instructions below for further details of plan.  This office note has been dictated using Teaching laboratory technician.  Unfortunately, this method of dictation can sometimes lead to typographical or grammatical errors.  I apologize for your inconvenience in advance if this occurs.  Please do not hesitate to reach out  to me if clarification is needed.      Theadora Rama Scales, [place] 11/06/23 1129

## 2023-11-08 DIAGNOSIS — I10 Essential (primary) hypertension: Secondary | ICD-10-CM | POA: Diagnosis not present

## 2023-11-08 DIAGNOSIS — J349 Unspecified disorder of nose and nasal sinuses: Secondary | ICD-10-CM | POA: Diagnosis not present

## 2023-11-08 DIAGNOSIS — J399 Disease of upper respiratory tract, unspecified: Secondary | ICD-10-CM | POA: Diagnosis not present

## 2023-11-25 DIAGNOSIS — R0981 Nasal congestion: Secondary | ICD-10-CM | POA: Diagnosis not present

## 2023-11-25 DIAGNOSIS — R051 Acute cough: Secondary | ICD-10-CM | POA: Diagnosis not present

## 2023-11-25 DIAGNOSIS — Z20822 Contact with and (suspected) exposure to covid-19: Secondary | ICD-10-CM | POA: Diagnosis not present

## 2023-12-05 ENCOUNTER — Ambulatory Visit (INDEPENDENT_AMBULATORY_CARE_PROVIDER_SITE_OTHER): Admitting: Family Medicine

## 2023-12-05 ENCOUNTER — Encounter: Payer: Self-pay | Admitting: Family Medicine

## 2023-12-05 VITALS — BP 130/64 | Ht <= 58 in | Wt 96.0 lb

## 2023-12-05 DIAGNOSIS — I10 Essential (primary) hypertension: Secondary | ICD-10-CM

## 2023-12-05 DIAGNOSIS — M810 Age-related osteoporosis without current pathological fracture: Secondary | ICD-10-CM

## 2023-12-05 DIAGNOSIS — R5383 Other fatigue: Secondary | ICD-10-CM | POA: Diagnosis not present

## 2023-12-05 DIAGNOSIS — E559 Vitamin D deficiency, unspecified: Secondary | ICD-10-CM | POA: Diagnosis not present

## 2023-12-05 MED ORDER — CALCIUM 600+D PLUS MINERALS 600-400 MG-UNIT PO TABS: 1.0000 | ORAL_TABLET | Freq: Two times a day (BID) | ORAL | 5 refills | Status: DC

## 2023-12-05 NOTE — Patient Instructions (Addendum)
 You have osteoporosis. We will look into tymlos/forteo for you. Take calcium /vitamin D - 1 tablet twice a day. Continue exercising including weight/bands twice a week. Get labs at your convenience - we will contact you with the results and if anything needs to be done for these.

## 2023-12-06 ENCOUNTER — Encounter: Payer: Self-pay | Admitting: Family Medicine

## 2023-12-06 NOTE — Progress Notes (Addendum)
 PCP: Jonathon Neighbors, MD  Subjective:   HPI: Patient is a 78 y.o. female here for osteoporosis.  Patient reports new diagnosis of osteoporosis based on recent bone density test. Prior treatment: none History of Hip, Spine, or Wrist Fracture: no Heart disease or stroke: yes but not in past year Cancer: no Kidney Disease: yes - stage 3 Gastric/Peptic Ulcer: no Gastric bypass surgery: no Severe GERD: no History of seizures: no Age at Menopause: unsure Calcium  intake: none Vitamin D intake: none Hormone replacement therapy: no Smoking history: never Alcohol: none Exercise: treadmill daily Major dental work in past year: no Parents with hip/spine fracture: no   Past Medical History:  Diagnosis Date   Arthritis    hands, but reports that she is still active    Cancer (HCC) 2019   skin cancer   Complication of anesthesia    states 44 yrs. ago she was given "Seconal" for childbirth  & she was passed out & felt like she was on fire   Coronary artery disease    Genital herpes    Glaucoma    bilateral    Hearing difficulty    bilateral, Left worse than R , no aids yet    Hypertension    Osteoporosis    Sleep apnea    tested negative but wakes up gasping for air in her sleep; does not qualify for CPAP    Current Outpatient Medications on File Prior to Visit  Medication Sig Dispense Refill   b complex vitamins capsule Take 1 capsule by mouth daily.     cetirizine  (ZYRTEC ) 5 MG tablet Take 1 tablet (5 mg total) by mouth daily. 30 tablet 2   diphenhydrAMINE  (BENADRYL ) 25 MG tablet Take 50 mg by mouth 2 (two) times daily as needed for allergies or itching.     famotidine  (PEPCID ) 10 MG tablet Take 10 mg by mouth 2 (two) times daily as needed for heartburn or indigestion.     fluoruracil (CARAC ) 0.5 % cream Apply 1 application topically daily as needed (skin cancer flare up).     hydrALAZINE  (APRESOLINE ) 50 MG tablet Take 50 mg by mouth daily.     isosorbide  mononitrate (IMDUR ) 60  MG 24 hr tablet Take 1 tablet (60 mg total) by mouth daily. 30 tablet 3   Multiple Vitamins-Minerals (MULTIVITAMIN ADULTS PO) Take 1 tablet by mouth daily.     olopatadine  (PATANOL) 0.1 % ophthalmic solution Place 1 drop into both eyes 2 (two) times daily. 5 mL 2   pantoprazole  (PROTONIX ) 40 MG tablet Take 1 tablet (40 mg total) by mouth daily. 30 tablet 1   tretinoin  (RETIN-A ) 0.05 % cream Apply 1 application topically at bedtime.     valsartan (DIOVAN) 80 MG tablet Take 160 mg by mouth daily.     ZIOPTAN  0.0015 % SOLN Place 1 drop into the left eye at bedtime.     Current Facility-Administered Medications on File Prior to Visit  Medication Dose Route Frequency Provider Last Rate Last Admin   mitoMYcin  (MUTAMYCIN ) Injection Use in OR only (0.4 mg/ml)  0.5 mL Left Eye Once Ben Bracken, MD        Past Surgical History:  Procedure Laterality Date   CESAREAN SECTION  1970, '72, '79   coronary catherization     MOUTH SURGERY     TRABECULECTOMY Left 03/26/2015   Procedure: TRABECULECTOMY WITH [facility] ON THE LEFT EYE;  Surgeon: Ben Bracken, MD;  Location: [facility] OR;  Service: Ophthalmology;  Laterality: Left;  TUBAL LIGATION      Allergies  Allergen Reactions   Fish Allergy Anaphylaxis   Justicia Adhatoda Anaphylaxis   Mango Flavoring Agent (Non-Screening) Anaphylaxis   Other Anaphylaxis    Tree nuts   Peanuts [Peanut Oil] Anaphylaxis   Penicillins Anaphylaxis    Did it involve swelling of the face/tongue/throat, SOB, or low BP? Yes Did it involve sudden or severe rash/hives, skin peeling, or any reaction on the inside of your mouth or nose? No Did you need to seek medical attention at a hospital or doctor's office? No When did it last happen?    Several Years Ago   If all above answers are "NO", may proceed with cephalosporin use.     Shellfish Allergy Swelling   Ciprofloxacin Hcl Other (See Comments)    Other reaction(s): Other Pt can take pill but no the drops Pt can take pill but  no the drops    Coconut Fatty Acid     Other reaction(s): Other, Unknown   Fluorescein -Benoxinate     Other reaction(s): Other Eye irritation (lid swelling, itching)   Seconal [Secobarbital Sodium] Other (See Comments)    Pt. felt like she was on fire, states her Mom told she passed out   Turmeric    Ace Inhibitors Hives   Alcohol-Sulfur [Elemental Sulfur] Other (See Comments)    Sleepy   Aspirin  Other (See Comments)    Abdominal irritation   Beta Adrenergic Blockers Hives   Brimonidine  Itching and Other (See Comments)    Puffy eyes - Alphagan     Caffeine Other (See Comments)    Jittery, feels nervous   Iodine Solution [Povidone Iodine] Itching and Other (See Comments)    Topical solution caused severe itching   Latex Itching   Milk-Related Compounds Other (See Comments)    Fluid in the ear and dizziness    Motrin [Ibuprofen] Other (See Comments)    Upset stomach   Norvasc [Amlodipine] Hives and Swelling   Sulfa Antibiotics Other (See Comments)    Can take with Benadryl     BP 130/64 (BP Location: Left Arm, Patient Position: Sitting)   Ht 4\' 10"  (1.473 m)   Wt 96 lb (43.5 kg)   BMI 20.06 kg/m       No data to display              No data to display              Objective:  Physical Exam:  Gen: NAD, comfortable in exam room  Dexa 08/31/23 T scores: AP spine -3.9, L fem neck -3.6 CMP 10/16/23 GFR 52, Calcium  9.7   Assessment & Plan:  1. Osteoporosis - severe with worst T-score of -3.9 at spine though also with left femoral neck at -3.6.  Given severity and very high risk of osteoporotic fracture would benefit from anabolic medication.  Has history of coronary artery disease but no MI in past 1 year so favor tymlos/forteo.  Will check cbc with diff, vitamin D, TSH.  Look into tymlos/forteo coverage.  Supplement with calcium  and vitamin D.  Exercise.    Total visit time 30 minutes including documentation.  Addendum:  Labs reviewed and discussed with patient -  her Vitamin D is low at 21.  Sent 50,000 international units in to take weekly x 6 weeks.  Will recheck after those doses.  Also TSH was slightly high - will send this note to her PCP to evaluate.  TSH was 8.270.

## 2023-12-12 ENCOUNTER — Other Ambulatory Visit: Payer: Self-pay | Admitting: Family Medicine

## 2023-12-12 DIAGNOSIS — E559 Vitamin D deficiency, unspecified: Secondary | ICD-10-CM | POA: Diagnosis not present

## 2023-12-12 DIAGNOSIS — R5383 Other fatigue: Secondary | ICD-10-CM | POA: Diagnosis not present

## 2023-12-12 DIAGNOSIS — I10 Essential (primary) hypertension: Secondary | ICD-10-CM | POA: Diagnosis not present

## 2023-12-12 DIAGNOSIS — M810 Age-related osteoporosis without current pathological fracture: Secondary | ICD-10-CM | POA: Diagnosis not present

## 2023-12-13 LAB — CBC WITH DIFFERENTIAL/PLATELET
Basophils Absolute: 0 10*3/uL (ref 0.0–0.2)
Basos: 1 %
EOS (ABSOLUTE): 0.1 10*3/uL (ref 0.0–0.4)
Eos: 1 %
Hematocrit: 39.8 % (ref 34.0–46.6)
Hemoglobin: 12.9 g/dL (ref 11.1–15.9)
Immature Grans (Abs): 0 10*3/uL (ref 0.0–0.1)
Immature Granulocytes: 0 %
Lymphocytes Absolute: 1.2 10*3/uL (ref 0.7–3.1)
Lymphs: 26 %
MCH: 30.3 pg (ref 26.6–33.0)
MCHC: 32.4 g/dL (ref 31.5–35.7)
MCV: 93 fL (ref 79–97)
Monocytes Absolute: 0.4 10*3/uL (ref 0.1–0.9)
Monocytes: 9 %
Neutrophils Absolute: 2.9 10*3/uL (ref 1.4–7.0)
Neutrophils: 63 %
Platelets: 254 10*3/uL (ref 150–450)
RBC: 4.26 x10E6/uL (ref 3.77–5.28)
RDW: 12.8 % (ref 11.7–15.4)
WBC: 4.6 10*3/uL (ref 3.4–10.8)

## 2023-12-13 LAB — VITAMIN D 25 HYDROXY (VIT D DEFICIENCY, FRACTURES): Vit D, 25-Hydroxy: 21.9 ng/mL — ABNORMAL LOW (ref 30.0–100.0)

## 2023-12-13 LAB — TSH: TSH: 8.27 u[IU]/mL — ABNORMAL HIGH (ref 0.450–4.500)

## 2023-12-13 MED ORDER — VITAMIN D (ERGOCALCIFEROL) 1.25 MG (50000 UNIT) PO CAPS: 50000.0000 [IU] | ORAL_CAPSULE | ORAL | 0 refills | Status: DC

## 2023-12-13 NOTE — Addendum Note (Signed)
 Addended by: Salina Craver on: 12/13/2023 11:07 AM   Modules accepted: Orders

## 2023-12-14 ENCOUNTER — Telehealth: Payer: Self-pay

## 2023-12-14 MED ORDER — VITAMIN D (ERGOCALCIFEROL) 1.25 MG (50000 UNIT) PO CAPS: 50000.0000 [IU] | ORAL_CAPSULE | ORAL | 0 refills | Status: DC

## 2023-12-14 MED ORDER — CALCIUM 600+D PLUS MINERALS 600-400 MG-UNIT PO TABS: 1.0000 | ORAL_TABLET | Freq: Two times a day (BID) | ORAL | 5 refills | Status: DC

## 2023-12-14 NOTE — Telephone Encounter (Signed)
 Calcium  and Vit D sent to pt's pharmacy.

## 2023-12-14 NOTE — Telephone Encounter (Signed)
-----   Message from [place] sent at 12/14/2023  4:27 PM EDT ----- Please resend all prescriptions to [facility], they state they didn't receive them. The vitamin D went to a pharmacy she isnt aware of. Please take that one off her list

## 2023-12-16 ENCOUNTER — Telehealth: Payer: Self-pay | Admitting: Family Medicine

## 2023-12-16 NOTE — Telephone Encounter (Signed)
-----   Message from [facility] sent at 12/15/2023  4:22 PM EDT ----- I solved the vit D problem.   The calcium  is available OTC so they wouldn't fill what you sent in. She wants you to know she's taking a calcium  supplement now - vital nutrients is the brand. With 12.5 mg of vit D3 and 500 mg calcium  plus others.  Should she take the calcium  you prescribed twice daily with her current supplement? She wants to know the max dose of vit d3, calcium , magnesium she should be taking daily so she doesn't take too much of something by adding this other calcium  + supplement. ----- Message ----- From: Everet Hitchcock Sent: 12/14/2023   4:30 PM EDT To: Kathryne Parisian, LAT  Please resend all prescriptions to [facility], they state they didn't receive them. The vitamin D  went to a pharmacy she isnt aware of. Please take that one off her list

## 2023-12-16 NOTE — Telephone Encounter (Signed)
 Ok so she should take 1 tablet of that twice a day.  Between that supplement and what she's getting in her diet she should be getting enough calcium  and Vitamin D .  Unless she's taking other supplements that have calcium  and vitamin D  in them I would tell her not to worry - she won't take too much of calcium , vitamin D , magnesium, etc.

## 2023-12-17 ENCOUNTER — Encounter: Payer: Self-pay | Admitting: Family Medicine

## 2023-12-19 ENCOUNTER — Ambulatory Visit: Admitting: Family Medicine

## 2023-12-20 ENCOUNTER — Encounter: Payer: Self-pay | Admitting: Family Medicine

## 2023-12-20 DIAGNOSIS — M810 Age-related osteoporosis without current pathological fracture: Secondary | ICD-10-CM | POA: Diagnosis not present

## 2023-12-20 DIAGNOSIS — N189 Chronic kidney disease, unspecified: Secondary | ICD-10-CM | POA: Diagnosis not present

## 2023-12-20 DIAGNOSIS — E039 Hypothyroidism, unspecified: Secondary | ICD-10-CM | POA: Diagnosis not present

## 2023-12-20 DIAGNOSIS — I129 Hypertensive chronic kidney disease with stage 1 through stage 4 chronic kidney disease, or unspecified chronic kidney disease: Secondary | ICD-10-CM | POA: Diagnosis not present

## 2023-12-20 DIAGNOSIS — I10 Essential (primary) hypertension: Secondary | ICD-10-CM | POA: Diagnosis not present

## 2024-01-21 ENCOUNTER — Encounter: Payer: Self-pay | Admitting: Family Medicine

## 2024-01-30 DIAGNOSIS — E039 Hypothyroidism, unspecified: Secondary | ICD-10-CM | POA: Diagnosis not present

## 2024-01-30 DIAGNOSIS — E559 Vitamin D deficiency, unspecified: Secondary | ICD-10-CM | POA: Diagnosis not present

## 2024-01-31 DIAGNOSIS — N189 Chronic kidney disease, unspecified: Secondary | ICD-10-CM | POA: Diagnosis not present

## 2024-01-31 DIAGNOSIS — F329 Major depressive disorder, single episode, unspecified: Secondary | ICD-10-CM | POA: Diagnosis not present

## 2024-01-31 DIAGNOSIS — E039 Hypothyroidism, unspecified: Secondary | ICD-10-CM | POA: Diagnosis not present

## 2024-01-31 DIAGNOSIS — I129 Hypertensive chronic kidney disease with stage 1 through stage 4 chronic kidney disease, or unspecified chronic kidney disease: Secondary | ICD-10-CM | POA: Diagnosis not present

## 2024-02-01 ENCOUNTER — Encounter (HOSPITAL_COMMUNITY): Payer: Self-pay

## 2024-02-01 ENCOUNTER — Other Ambulatory Visit: Payer: Self-pay

## 2024-02-01 ENCOUNTER — Emergency Department (HOSPITAL_COMMUNITY)

## 2024-02-01 ENCOUNTER — Emergency Department (HOSPITAL_COMMUNITY)
Admission: EM | Admit: 2024-02-01 | Discharge: 2024-02-01 | Disposition: A | Discharge status: Home / Self Care | Attending: Emergency Medicine | Admitting: Emergency Medicine

## 2024-02-01 DIAGNOSIS — Z79899 Other long term (current) drug therapy: Secondary | ICD-10-CM | POA: Diagnosis not present

## 2024-02-01 DIAGNOSIS — R000 Tachycardia, unspecified: Secondary | ICD-10-CM | POA: Insufficient documentation

## 2024-02-01 DIAGNOSIS — N183 Chronic kidney disease, stage 3 unspecified: Secondary | ICD-10-CM | POA: Diagnosis not present

## 2024-02-01 DIAGNOSIS — I129 Hypertensive chronic kidney disease with stage 1 through stage 4 chronic kidney disease, or unspecified chronic kidney disease: Secondary | ICD-10-CM | POA: Diagnosis not present

## 2024-02-01 DIAGNOSIS — I10 Essential (primary) hypertension: Secondary | ICD-10-CM | POA: Diagnosis not present

## 2024-02-01 DIAGNOSIS — R0602 Shortness of breath: Secondary | ICD-10-CM | POA: Insufficient documentation

## 2024-02-01 DIAGNOSIS — Z91040 Latex allergy status: Secondary | ICD-10-CM | POA: Diagnosis not present

## 2024-02-01 DIAGNOSIS — Z91010 Allergy to peanuts: Secondary | ICD-10-CM | POA: Insufficient documentation

## 2024-02-01 DIAGNOSIS — E871 Hypo-osmolality and hyponatremia: Secondary | ICD-10-CM | POA: Insufficient documentation

## 2024-02-01 DIAGNOSIS — I6789 Other cerebrovascular disease: Secondary | ICD-10-CM | POA: Diagnosis not present

## 2024-02-01 LAB — COMPREHENSIVE METABOLIC PANEL WITH GFR
ALT: 14 U/L (ref 0–44)
AST: 29 U/L (ref 15–41)
Albumin: 4.7 g/dL (ref 3.5–5.0)
Alkaline Phosphatase: 66 U/L (ref 38–126)
Anion gap: 14 (ref 5–15)
BUN: 14 mg/dL (ref 8–23)
CO2: 21 mmol/L — ABNORMAL LOW (ref 22–32)
Calcium: 10.4 mg/dL — ABNORMAL HIGH (ref 8.9–10.3)
Chloride: 97 mmol/L — ABNORMAL LOW (ref 98–111)
Creatinine, Ser: 1.16 mg/dL — ABNORMAL HIGH (ref 0.44–1.00)
GFR, Estimated: 48 mL/min — ABNORMAL LOW (ref >60)
Glucose, Bld: 123 mg/dL — ABNORMAL HIGH (ref 70–99)
Potassium: 3.5 mmol/L (ref 3.5–5.1)
Sodium: 132 mmol/L — ABNORMAL LOW (ref 135–145)
Total Bilirubin: 0.8 mg/dL (ref 0.0–1.2)
Total Protein: 8 g/dL (ref 6.5–8.1)

## 2024-02-01 LAB — CBC WITH DIFFERENTIAL/PLATELET
Abs Immature Granulocytes: 0.02 10*3/uL (ref 0.00–0.07)
Basophils Absolute: 0 10*3/uL (ref 0.0–0.1)
Basophils Relative: 1 %
Eosinophils Absolute: 0.1 10*3/uL (ref 0.0–0.5)
Eosinophils Relative: 1 %
HCT: 40.3 % (ref 36.0–46.0)
Hemoglobin: 13.5 g/dL (ref 12.0–15.0)
Immature Granulocytes: 0 %
Lymphocytes Relative: 20 %
Lymphs Abs: 1.3 10*3/uL (ref 0.7–4.0)
MCH: 30.2 pg (ref 26.0–34.0)
MCHC: 33.5 g/dL (ref 30.0–36.0)
MCV: 90.2 fL (ref 80.0–100.0)
Monocytes Absolute: 0.5 10*3/uL (ref 0.1–1.0)
Monocytes Relative: 8 %
Neutro Abs: 4.7 10*3/uL (ref 1.7–7.7)
Neutrophils Relative %: 70 %
Platelets: 211 10*3/uL (ref 150–400)
RBC: 4.47 MIL/uL (ref 3.87–5.11)
RDW: 13.7 % (ref 11.5–15.5)
WBC: 6.6 10*3/uL (ref 4.0–10.5)
nRBC: 0 % (ref 0.0–0.2)

## 2024-02-01 LAB — TROPONIN I (HIGH SENSITIVITY)
Troponin I (High Sensitivity): 9 ng/L (ref <18)
Troponin I (High Sensitivity): 8 ng/L (ref <18)

## 2024-02-01 LAB — BRAIN NATRIURETIC PEPTIDE: B Natriuretic Peptide: 47.3 pg/mL (ref 0.0–100.0)

## 2024-02-01 MED ORDER — IOHEXOL 350 MG/ML SOLN
60.0000 mL | Freq: Once | INTRAVENOUS | Status: AC | PRN
  Administered 2024-02-01: 60 mL via INTRAVENOUS

## 2024-02-01 MED ORDER — LORAZEPAM 2 MG/ML IJ SOLN
1.0000 mg | Freq: Once | INTRAMUSCULAR | Status: AC
  Administered 2024-02-01: 1 mg via INTRAVENOUS
  Filled 2024-02-01: qty 1

## 2024-02-01 MED ORDER — SODIUM CHLORIDE 0.9 % IV BOLUS
500.0000 mL | Freq: Once | INTRAVENOUS | Status: AC
  Administered 2024-02-01: 500 mL via INTRAVENOUS

## 2024-02-01 NOTE — Discharge Instructions (Signed)
 Your workup today was reassuring, there was no bloodclot, evidence of heart disease, pneumonia, and your shortness of breath improved. Please follow up closely with your primary care physician.

## 2024-02-01 NOTE — ED Notes (Signed)
 Patient's husband called and stated he will be here to pick up patient

## 2024-02-01 NOTE — ED Triage Notes (Incomplete)
 PT coming from home c/o shob of that started 2 hrs ago.

## 2024-02-01 NOTE — ED Provider Notes (Signed)
 [place] EMERGENCY DEPARTMENT AT [facility] Provider Note   CSN: [id number] Arrival date & time: 02/01/24  4098     Patient presents with: Shortness of Breath   Karla Richards is a 78 y.o. female with past medical history seen for hypertension, stage III CKD, previous TIA who presents with concern for shortness of breath.  She reports that shortness of breath started spontaneously around 3 hours ago.  She describes hyperventilating.  She reports that she was feeling some stress prior to this began.  She denies any chest pain.  She denies any previous history of similar.  No previous history of blood clots.  Does not take blood thinner.    Shortness of Breath      Prior to Admission medications   Medication Sig Start Date End Date Taking? Authorizing Provider  b complex vitamins capsule Take 1 capsule by mouth daily.    [provider]  Calcium  Carbonate-Vit D-Min (CALCIUM  600+D PLUS MINERALS) 600-400 MG-UNIT TABS Take 1 tablet by mouth in the morning and at bedtime. 12/14/23   Hudnall, Fabienne Holter, MD  cetirizine  (ZYRTEC ) 5 MG tablet Take 1 tablet (5 mg total) by mouth daily. 08/04/22   Rising, Ivette Marks, PA-C  diphenhydrAMINE  (BENADRYL ) 25 MG tablet Take 50 mg by mouth 2 (two) times daily as needed for allergies or itching.    [provider]  famotidine  (PEPCID ) 10 MG tablet Take 10 mg by mouth 2 (two) times daily as needed for heartburn or indigestion.    [provider]  fluoruracil (CARAC ) 0.5 % cream Apply 1 application topically daily as needed (skin cancer flare up).    [provider]  hydrALAZINE  (APRESOLINE ) 50 MG tablet Take 50 mg by mouth daily.    [provider]  isosorbide  mononitrate (IMDUR ) 60 MG 24 hr tablet Take 1 tablet (60 mg total) by mouth daily. 06/12/22   Macdonald Savoy, MD  Multiple Vitamins-Minerals (MULTIVITAMIN ADULTS PO) Take 1 tablet by mouth daily.    [provider]   olopatadine  (PATANOL) 0.1 % ophthalmic solution Place 1 drop into both eyes 2 (two) times daily. 08/04/22   Rising, Ivette Marks, PA-C  pantoprazole  (PROTONIX ) 40 MG tablet Take 1 tablet (40 mg total) by mouth daily. 10/21/22   Mozell Arias, MD  tretinoin  (RETIN-A ) 0.05 % cream Apply 1 application topically at bedtime. 11/23/19   [provider]  valsartan (DIOVAN) 80 MG tablet Take 160 mg by mouth daily. 11/21/18   [provider]  Vitamin D , Ergocalciferol , (DRISDOL ) 1.25 MG (50000 UNIT) CAPS capsule Take 1 capsule (50,000 Units total) by mouth every 7 (seven) days. 12/14/23   Hudnall, Fabienne Holter, MD  ZIOPTAN  0.0015 % SOLN Place 1 drop into the left eye at bedtime. 02/12/21   [provider]    Allergies: Fish allergy, Justicia adhatoda, Mango flavoring agent (non-screening), Other, Peanuts [peanut oil], Penicillins, Shellfish allergy, Ciprofloxacin hcl, Coconut fatty acid, Fluorescein -benoxinate, Seconal [secobarbital sodium], Turmeric, Ace inhibitors, Alcohol-sulfur [elemental sulfur], Aspirin , Beta adrenergic blockers, Brimonidine , Caffeine, Iodine solution [povidone iodine], Latex, Milk-related compounds, Motrin [ibuprofen], Norvasc [amlodipine], and Sulfa antibiotics    Review of Systems  Respiratory:  Positive for shortness of breath.   All other systems reviewed and are negative.   Updated Vital Signs BP 125/72 (BP Location: Right Arm)   Pulse 61   Temp 97.7 F (36.5 C) (Oral)   Resp 12   Ht 4' 10 (1.473 m)   Wt 44.9 kg   SpO2 100%  BMI 20.69 kg/m   Physical Exam Vitals and nursing note reviewed.  Constitutional:      General: She is not in acute distress.    Appearance: Normal appearance.  HENT:     Head: Normocephalic and atraumatic.   Eyes:     General:        Right eye: No discharge.        Left eye: No discharge.    Cardiovascular:     Rate and Rhythm: Normal rate and regular rhythm.     Heart sounds: No murmur heard.    No friction rub.  No gallop.  Pulmonary:     Effort: Pulmonary effort is normal. Tachypnea present.     Breath sounds: Normal breath sounds.     Comments: No wheezing, rhonchi, stridor, rales Abdominal:     General: Bowel sounds are normal.     Palpations: Abdomen is soft.   Skin:    General: Skin is warm and dry.     Capillary Refill: Capillary refill takes less than 2 seconds.   Neurological:     Mental Status: She is alert and oriented to person, place, and time.     Comments: Alert and oriented x 3.  Moves all 4 limbs spontaneously.  Overall answers questions appropriately but some repetitive questioning, repetition of answers.  Psychiatric:        Mood and Affect: Mood normal.        Behavior: Behavior normal.     Comments: Quite anxious on initial exam     (all labs ordered are listed, but only abnormal results are displayed) Labs Reviewed  COMPREHENSIVE METABOLIC PANEL WITH GFR - Abnormal; Notable for the following components:      Result Value   Sodium 132 (*)    Chloride 97 (*)    CO2 21 (*)    Glucose, Bld 123 (*)    Creatinine, Ser 1.16 (*)    Calcium  10.4 (*)    GFR, Estimated 48 (*)    All other components within normal limits  CBC WITH DIFFERENTIAL/PLATELET  BRAIN NATRIURETIC PEPTIDE  TROPONIN I (HIGH SENSITIVITY)  TROPONIN I (HIGH SENSITIVITY)    EKG: EKG Interpretation Date/Time:  Wednesday February 01 2024 03:26:09 EDT Ventricular Rate:  56 PR Interval:  162 QRS Duration:  89 QT Interval:  442 QTC Calculation: 427 R Axis:   68  Text Interpretation: Sinus rhythm Low voltage, precordial leads No significant change was found Confirmed by Earma Gloss [phone]) on 02/01/2024 4:09:31 AM  Radiology: CT Angio Chest PE W and/or Wo Contrast Result Date: 02/01/2024 CLINICAL DATA:  78 year old female with altered mental status, shortness of breath. EXAM: CT ANGIOGRAPHY CHEST WITH CONTRAST TECHNIQUE: Multidetector CT imaging of the chest was performed using the standard  protocol during bolus administration of intravenous contrast. Multiplanar CT image reconstructions and MIPs were obtained to evaluate the vascular anatomy. RADIATION DOSE REDUCTION: This exam was performed according to the departmental dose-optimization program which includes automated exposure control, adjustment of the mA and/or kV according to patient size and/or use of iterative reconstruction technique. CONTRAST:  60mL OMNIPAQUE IOHEXOL 350 MG/ML SOLN COMPARISON:  Chest radiographs 0156 hours today. FINDINGS: Cardiovascular: Excellent contrast bolus timing in the pulmonary arterial tree. No pulmonary artery filling defect. Calcified coronary artery atherosclerosis (series 11, image 172). Little contrast in the aorta. Heart size within normal limits. No pericardial effusion. Mediastinum/Nodes: Negative for mediastinal mass or lymphadenopathy. Lungs/Pleura: Central airways are patent. Low normal lung volumes. Mild and fairly  symmetric costophrenic angle curvilinear opacity most resembles atelectasis or scarring. No pleural effusion, confluent or suspicious lung opacity. Upper Abdomen: Negative early arterial enhanced appearance of the visible liver, gallbladder, spleen, adrenal glands and kidneys. Pancreas and proximal small bowel appear indistinct, which might be artifact from early contrast timing. No pneumoperitoneum or free fluid in the visible upper abdomen. Musculoskeletal: Osteopenia. Advanced thoracic disc and endplate degeneration. Widespread thoracic endplate osteophytes but no convincing vertebral ankylosis. No acute or suspicious osseous lesion identified. Review of the MIP images confirms the above findings. IMPRESSION: 1. Negative for acute pulmonary embolus. 2. No acute finding identified in the chest. 3. Calcified coronary artery, Aortic Atherosclerosis (ICD10-I70.0). Electronically Signed   By: Marlise Simpers M.D.   On: 02/01/2024 04:34   CT Head Wo Contrast Result Date: 02/01/2024 CLINICAL DATA:   78 year old female with altered mental status, shortness of breath. EXAM: CT HEAD WITHOUT CONTRAST TECHNIQUE: Contiguous axial images were obtained from the base of the skull through the vertex without intravenous contrast. RADIATION DOSE REDUCTION: This exam was performed according to the departmental dose-optimization program which includes automated exposure control, adjustment of the mA and/or kV according to patient size and/or use of iterative reconstruction technique. COMPARISON:  Brain MRI 03/01/2021.  Head CT 04/15/2023. FINDINGS: Brain: Cerebral volume remains normal for age. No midline shift, ventriculomegaly, mass effect, evidence of mass lesion, intracranial hemorrhage or evidence of cortically based acute infarction. Moderate patchy, asymmetric cerebral white matter hypodensity including deep white matter capsule involvement in the left hemisphere. Chronic left thalamic lacunar infarct. Stable gray-white matter differentiation throughout the brain. No cortical encephalomalacia identified. Vascular: No suspicious intracranial vascular hyperdensity. Calcified atherosclerosis at the skull base. Skull: Stable and intact. Sinuses/Orbits: Visualized paranasal sinuses and mastoids are stable and well aerated. Other: No acute orbit or scalp soft tissue finding. IMPRESSION: 1. No acute intracranial abnormality. 2. Stable non contrast CT appearance of moderate for age chronic small vessel disease. Electronically Signed   By: Marlise Simpers M.D.   On: 02/01/2024 04:27   DG Chest 2 View Result Date: 02/01/2024 EXAM: 2 VIEW(S) XRAY OF THE CHEST 02/01/2024 02:00:00 AM COMPARISON: 01/06/2024 CLINICAL HISTORY: sob. Shortness of breath FINDINGS: LUNGS AND PLEURA: No focal pulmonary opacity. No pulmonary edema. No pleural effusion. No pneumothorax. HEART AND MEDIASTINUM: No acute abnormality of the cardiac and mediastinal silhouettes. BONES AND SOFT TISSUES: Mild degenerative changes of the visualized thoracolumbar spine.  No acute osseous abnormality. IMPRESSION: 1. No acute process. Electronically signed by: Zadie Herter MD 02/01/2024 02:03 AM EDT RP Workstation: [id number]     Procedures   Medications Ordered in the ED  LORazepam  (ATIVAN ) injection 1 mg (1 mg Intravenous Given 02/01/24 0133)  sodium chloride  0.9 % bolus 500 mL (500 mLs Intravenous New Bag/Given 02/01/24 0421)  iohexol (OMNIPAQUE) 350 MG/ML injection 60 mL (60 mLs Intravenous Contrast Given 02/01/24 0355)                                    Medical Decision Making Amount and/or Complexity of Data Reviewed Labs: ordered. Radiology: ordered. ECG/medicine tests: ordered.  Risk Prescription drug management.   This patient is a 78 y.o. female  who presents to the ED for concern of shortness of breath, hyperventilation.   Differential diagnoses prior to evaluation: The emergent differential diagnosis includes, but is not limited to,  asthma exacerbation, COPD exacerbation, acute upper respiratory infection, acute bronchitis, chronic bronchitis, interstitial  lung disease, ARDS, PE, pneumonia, atypical ACS, carbon monoxide poisoning, spontaneous pneumothorax, new CHF vs CHF exacerbation, versus other . This is not an exhaustive differential.   Past Medical History / Co-morbidities / Social History: hypertension, stage III CKD, previous TIA  Physical Exam: Physical exam performed. The pertinent findings include:  Quite anxious on initial exam   Initially with tachypneic respiration but no accessory breath sounds, on re-eval, continues to be clear bilaterally but tachypnea has resolves.  Initially hypertensive, blood pressure 173/84, improved to 125/72 on recheck.\  No focal neurologic deficit noted.  Lab Tests/Imaging studies: I personally interpreted labs/imaging and the pertinent results include: CMP notable for mild hyponatremia, sodium 132, she has mild bicarb deficit likely secondary to her hyperventilation.  Creatinine is very  slightly elevated 1.16 compared to baseline of around 1.05.  Normal BNP, negative troponin x 2, CBC is unremarkable.  I do feel interpreted CT of the head, CT angio chest, plain film chest x-ray shows no evidence of acute intrathoracic or intracranial abnormality to explain patient's symptoms.  No evidence of blood clot noted. I agree with the radiologist interpretation.  Cardiac monitoring: EKG obtained and interpreted by myself and attending physician which shows: NSR, no acute st-t changes   Medications: I ordered medication including fluid bolus, Ativan .  I have reviewed the patients home medicines and have made adjustments as needed.   Disposition: After consideration of the diagnostic results and the patients response to treatment, I feel that on reassessment patient is markedly improved, with no remaining tachypnea.  She reports that she feels improved.  I suspect that she was having a panic attack or stress response, unclear why this may have been triggered today, but there is no identifiable etiology for her symptoms in the emergency department otherwise, encourage close PCP follow up.   emergency department workup does not suggest an emergent condition requiring admission or immediate intervention beyond what has been performed at this time. The plan is: as above. The patient is safe for discharge and has been instructed to return immediately for worsening symptoms, change in symptoms or any other concerns.   Final diagnoses:  SOB (shortness of breath)    ED Discharge Orders     None          Nelly Banco, PA-C 02/01/24 0458    Earma Gloss, MD 02/01/24 [phone]

## 2024-02-02 ENCOUNTER — Emergency Department (HOSPITAL_COMMUNITY)
Admission: EM | Admit: 2024-02-02 | Discharge: 2024-02-02 | Discharge status: Against Medical Advice | Attending: Emergency Medicine | Admitting: Emergency Medicine

## 2024-02-02 ENCOUNTER — Ambulatory Visit: Payer: Self-pay

## 2024-02-02 DIAGNOSIS — Z5321 Procedure and treatment not carried out due to patient leaving prior to being seen by health care provider: Secondary | ICD-10-CM | POA: Insufficient documentation

## 2024-02-02 DIAGNOSIS — R413 Other amnesia: Secondary | ICD-10-CM | POA: Diagnosis not present

## 2024-02-02 DIAGNOSIS — R0602 Shortness of breath: Secondary | ICD-10-CM | POA: Diagnosis not present

## 2024-02-02 NOTE — ED Notes (Signed)
 Pt called x1 in ED lobby for vitals assessment, Patient not visualized in the ED lobby and did not respond to name call.

## 2024-02-02 NOTE — ED Notes (Signed)
 Pt called x2 in ED lobby for vitals assessment. Patient not visualized in the ED lobby and did not respond to name call.

## 2024-02-02 NOTE — ED Triage Notes (Addendum)
 Pt c/o intermittent SOB and increasing memory issues started yesterday.  Pt was seen yesterday for same and discharged this morning.  Pt is calm and breathing normally in triage.    Pt reports she has been under significant stress (illness, family problems, and financial problems).

## 2024-02-02 NOTE — Telephone Encounter (Signed)
 FYI Only or Action Required?: FYI only for provider.  Patient was last seen in primary care on 10/01/23 with PCP Dr. Nida Barrow. Called Nurse Triage reporting Shortness of Breath. Symptoms began yesterday. Interventions attempted: Other: Seen in the ED on 6/18. Symptoms are: gradually worsening.  Triage Disposition: Go to ED Now (Notify PCP)  Patient/caregiver understands and will follow disposition?: Yes                  Copied from CRM [id number]. Topic: Clinical - Red Word Triage >> Feb 02, 2024  4:40 PM Karla Richards wrote: Red Word that prompted transfer to Nurse Triage: breathing crisis, shortness of breath   ----------------------------------------------------------------------- From previous Reason for Contact - Scheduling: Patient/patient representative is calling to schedule an appointment. Refer to attachments for appointment information. Reason for Disposition  [1] MODERATE difficulty breathing (e.g., speaks in phrases, SOB even at rest, pulse 100-120) AND [2] NEW-onset or WORSE than normal  Answer Assessment - Initial Assessment Questions 1. RESPIRATORY STATUS: Describe your breathing? (e.g., wheezing, shortness of breath, unable to speak, severe coughing)      SOB  2. ONSET: When did this breathing problem begin?      X 1 day, was seen in the ED yesterday.   3. PATTERN Does the difficult breathing come and go, or has it been constant since it started?      Intermittent but progressing   4. SEVERITY: How bad is your breathing? (e.g., mild, moderate, severe)    - MILD: No SOB at rest, mild SOB with walking, speaks normally in sentences, can lie down, no retractions, pulse < 100.    - MODERATE: SOB at rest, SOB with minimal exertion and prefers to sit, cannot lie down flat, speaks in phrases, mild retractions, audible wheezing, pulse 100-120.    - SEVERE: Very SOB at rest, speaks in single words, struggling to breathe, sitting hunched forward, retractions, pulse >  120      Moderate   5. RECURRENT SYMPTOM: Have you had difficulty breathing before? If Yes, ask: When was the last time? and What happened that time?      No   6. CARDIAC HISTORY: Do you have any history of heart disease? (e.g., heart attack, angina, bypass surgery, angioplasty)      Hardened left ventricle, sees Cardiologist   7. LUNG HISTORY: Do you have any history of lung disease?  (e.g., pulmonary embolus, asthma, emphysema)     No  8. CAUSE: What do you think is causing the breathing problem?      Unknown   9. OTHER SYMPTOMS: Do you have any other symptoms? (e.g., dizziness, runny nose, cough, chest pain, fever)     No  Patient was seen in the ED on 6/18 for same symptoms, she reports she feels worse today and symptoms are not improving. No at Home O2, or inhalers have been prescribed. Patient was calling to be scheduled as a new patient with Pulmonology, however based on the acute symptoms ED was advised. She agrees with plan of care, and will go now. Pt. Will call back after discharge to schedule with Pulmonology.  Protocols used: Breathing Difficulty-A-AH

## 2024-02-02 NOTE — ED Notes (Signed)
 Pt called x3 in ED lobby for vitals assessment. Patient not visualized in the ED lobby and did not respond to name call.

## 2024-02-08 ENCOUNTER — Observation Stay (HOSPITAL_COMMUNITY)
Admission: EM | Admit: 2024-02-08 | Discharge: 2024-02-10 | Disposition: A | Discharge status: Home / Self Care | Attending: Emergency Medicine | Admitting: Emergency Medicine

## 2024-02-08 ENCOUNTER — Other Ambulatory Visit: Payer: Self-pay

## 2024-02-08 ENCOUNTER — Emergency Department (HOSPITAL_COMMUNITY)

## 2024-02-08 ENCOUNTER — Encounter (HOSPITAL_COMMUNITY): Payer: Self-pay | Admitting: *Deleted

## 2024-02-08 ENCOUNTER — Other Ambulatory Visit: Payer: Self-pay | Admitting: Family Medicine

## 2024-02-08 ENCOUNTER — Other Ambulatory Visit (HOSPITAL_COMMUNITY): Payer: Self-pay

## 2024-02-08 ENCOUNTER — Ambulatory Visit (INDEPENDENT_AMBULATORY_CARE_PROVIDER_SITE_OTHER): Admitting: Family Medicine

## 2024-02-08 ENCOUNTER — Encounter (HOSPITAL_COMMUNITY): Payer: Self-pay

## 2024-02-08 VITALS — BP 174/73 | Ht 58.5 in | Wt 100.0 lb

## 2024-02-08 DIAGNOSIS — Z79899 Other long term (current) drug therapy: Secondary | ICD-10-CM | POA: Diagnosis not present

## 2024-02-08 DIAGNOSIS — R4701 Aphasia: Principal | ICD-10-CM | POA: Insufficient documentation

## 2024-02-08 DIAGNOSIS — T7849XA Other allergy, initial encounter: Secondary | ICD-10-CM | POA: Diagnosis not present

## 2024-02-08 DIAGNOSIS — M810 Age-related osteoporosis without current pathological fracture: Secondary | ICD-10-CM

## 2024-02-08 DIAGNOSIS — R2681 Unsteadiness on feet: Secondary | ICD-10-CM | POA: Diagnosis not present

## 2024-02-08 DIAGNOSIS — I129 Hypertensive chronic kidney disease with stage 1 through stage 4 chronic kidney disease, or unspecified chronic kidney disease: Secondary | ICD-10-CM | POA: Diagnosis not present

## 2024-02-08 DIAGNOSIS — E876 Hypokalemia: Secondary | ICD-10-CM | POA: Diagnosis not present

## 2024-02-08 DIAGNOSIS — R4189 Other symptoms and signs involving cognitive functions and awareness: Secondary | ICD-10-CM

## 2024-02-08 DIAGNOSIS — G9340 Encephalopathy, unspecified: Secondary | ICD-10-CM | POA: Diagnosis present

## 2024-02-08 DIAGNOSIS — N1831 Chronic kidney disease, stage 3a: Secondary | ICD-10-CM | POA: Diagnosis present

## 2024-02-08 DIAGNOSIS — E039 Hypothyroidism, unspecified: Secondary | ICD-10-CM | POA: Insufficient documentation

## 2024-02-08 DIAGNOSIS — Z91040 Latex allergy status: Secondary | ICD-10-CM | POA: Diagnosis not present

## 2024-02-08 DIAGNOSIS — Z91010 Allergy to peanuts: Secondary | ICD-10-CM | POA: Diagnosis not present

## 2024-02-08 DIAGNOSIS — R0602 Shortness of breath: Secondary | ICD-10-CM | POA: Diagnosis not present

## 2024-02-08 DIAGNOSIS — I10 Essential (primary) hypertension: Secondary | ICD-10-CM | POA: Diagnosis present

## 2024-02-08 DIAGNOSIS — G3184 Mild cognitive impairment, so stated: Secondary | ICD-10-CM | POA: Insufficient documentation

## 2024-02-08 LAB — CBC WITH DIFFERENTIAL/PLATELET
Abs Immature Granulocytes: 0.01 10*3/uL (ref 0.00–0.07)
Basophils Absolute: 0.1 10*3/uL (ref 0.0–0.1)
Basophils Relative: 1 %
Eosinophils Absolute: 0.1 10*3/uL (ref 0.0–0.5)
Eosinophils Relative: 1 %
HCT: 40.5 % (ref 36.0–46.0)
Hemoglobin: 13.2 g/dL (ref 12.0–15.0)
Immature Granulocytes: 0 %
Lymphocytes Relative: 36 %
Lymphs Abs: 2.1 10*3/uL (ref 0.7–4.0)
MCH: 29.3 pg (ref 26.0–34.0)
MCHC: 32.6 g/dL (ref 30.0–36.0)
MCV: 90 fL (ref 80.0–100.0)
Monocytes Absolute: 0.9 10*3/uL (ref 0.1–1.0)
Monocytes Relative: 15 %
Neutro Abs: 2.7 10*3/uL (ref 1.7–7.7)
Neutrophils Relative %: 47 %
Platelets: 242 10*3/uL (ref 150–400)
RBC: 4.5 MIL/uL (ref 3.87–5.11)
RDW: 13.9 % (ref 11.5–15.5)
WBC: 5.8 10*3/uL (ref 4.0–10.5)
nRBC: 0 % (ref 0.0–0.2)

## 2024-02-08 LAB — CBG MONITORING, ED: Glucose-Capillary: 100 mg/dL — ABNORMAL HIGH (ref 70–99)

## 2024-02-08 MED ORDER — LORAZEPAM 2 MG/ML IJ SOLN
INTRAMUSCULAR | Status: AC
  Administered 2024-02-08: 0.5 mg
  Filled 2024-02-08: qty 1

## 2024-02-08 MED ORDER — ASSURE ID DUO PRO PEN NEEDLES 31G X 5 MM MISC
1.0000 | Freq: Every day | 24 refills | Status: DC
Start: 2024-02-08 — End: 2024-02-26
  Filled 2024-02-08: qty 30, fill #0

## 2024-02-08 MED ORDER — IOHEXOL 350 MG/ML SOLN
75.0000 mL | Freq: Once | INTRAVENOUS | Status: AC | PRN
  Administered 2024-02-08: 75 mL via INTRAVENOUS

## 2024-02-08 MED ORDER — TYMLOS 3120 MCG/1.56ML ~~LOC~~ SOPN
80.0000 ug | PEN_INJECTOR | Freq: Every day | SUBCUTANEOUS | 12 refills | Status: DC
  Filled 2024-02-08: qty 1.56, fill #0

## 2024-02-08 NOTE — Patient Instructions (Signed)
 Get your vitamin D  levels checked after you leave today - this is important. We will look into tymlos/forteo for your osteoporosis.

## 2024-02-08 NOTE — ED Triage Notes (Signed)
 Pt arrives with her spouse who says she came out of the bedroom and said she needed to go to the hospital. He says she was just here for the same thing she appears weaker today and looks SOB and is hyperventilating, saturations 100%. She denies any pain, tremulous at times, she says she is not urinating enough.

## 2024-02-08 NOTE — ED Provider Notes (Signed)
 [place] EMERGENCY DEPARTMENT AT [facility] Provider Note   CSN: [id number] Arrival date & time: 02/08/24  2210     Patient presents with: Shortness of Breath   Karla Richards is a 78 y.o. female with medical history of TIA, retention.  Patient not on blood thinners.  Presents to ED for complaint of shortness of breath.  Husband at the bedside provides the majority of the history.  Per patient husband, the patient has been to the ED 3 times this week.  This is her third visit.  According patient has been, patient initially presented on 6/19 out of concern for shortness of breath.  Unremarkable workup was obtained and patient was discharged home.  She returned a few days later citing similar symptoms but apparently saw a friend of hers in the lobby and requested to go home a few hours later.  The patient husband states that she had a PCP follow-up appointment today where she had labs drawn.  Reports that she then went home around 530, ate pancakes and lay down for sleep which is at her typical routine.  He reports that she woke up around 10 PM, hunched over walking to him stating that she needed to go to the hospital.  She was complaining of shortness of breath.  On my examination, the patient is unable to follow commands.  She is unable to find her words.  She appears to be having some kind of possible panic attack however due to the fact that she cannot provide her words, cannot name simple objects and cannot follow commands I had my attending Dr. Griselda come to the bedside and evaluate the patient.  Code stroke was called this time after discussion and evaluation with Dr. Griselda.    Shortness of Breath      Prior to Admission medications   Medication Sig Start Date End Date Taking? Authorizing Provider  Abaloparatide (TYMLOS) 3120 MCG/1.56ML SOPN Inject 80 mcg into the skin daily. 02/08/24   Cleatrice Ludie SAUNDERS, MD  b complex vitamins capsule Take 1 capsule by mouth daily.     [provider]  Calcium  Carbonate-Vit D-Min (CALCIUM  600+D PLUS MINERALS) 600-400 MG-UNIT TABS Take 1 tablet by mouth in the morning and at bedtime. 12/14/23   Hudnall, Ludie SAUNDERS, MD  cetirizine  (ZYRTEC ) 5 MG tablet Take 1 tablet (5 mg total) by mouth daily. 08/04/22   Rising, Asberry, PA-C  diphenhydrAMINE  (BENADRYL ) 25 MG tablet Take 50 mg by mouth 2 (two) times daily as needed for allergies or itching.    [provider]  famotidine  (PEPCID ) 10 MG tablet Take 10 mg by mouth 2 (two) times daily as needed for heartburn or indigestion.    [provider]  fluoruracil (CARAC ) 0.5 % cream Apply 1 application topically daily as needed (skin cancer flare up).    [provider]  hydrALAZINE  (APRESOLINE ) 50 MG tablet Take 50 mg by mouth daily.    [provider]  Insulin Pen Needle (ASSURE ID DUO PRO PEN NEEDLES) 31G X 5 MM MISC 1 each by Does not apply route daily. 02/08/24   Cleatrice Ludie SAUNDERS, MD  isosorbide  mononitrate (IMDUR ) 60 MG 24 hr tablet Take 1 tablet (60 mg total) by mouth daily. 06/12/22   Odell Celinda Balo, MD  Multiple Vitamins-Minerals (MULTIVITAMIN ADULTS PO) Take 1 tablet by mouth daily.    [provider]  olopatadine  (PATANOL) 0.1 % ophthalmic solution Place 1 drop into both eyes 2 (two) times daily. 08/04/22  Rising, Asberry, PA-C  pantoprazole  (PROTONIX ) 40 MG tablet Take 1 tablet (40 mg total) by mouth daily. 10/21/22   Patsey Lot, MD  tretinoin  (RETIN-A ) 0.05 % cream Apply 1 application topically at bedtime. 11/23/19   [provider]  valsartan (DIOVAN) 80 MG tablet Take 160 mg by mouth daily. 11/21/18   [provider]  Vitamin D , Ergocalciferol , (DRISDOL ) 1.25 MG (50000 UNIT) CAPS capsule Take 1 capsule (50,000 Units total) by mouth every 7 (seven) days. 12/14/23   Hudnall, Ludie SAUNDERS, MD  ZIOPTAN  0.0015 % SOLN Place 1 drop into the left eye at bedtime. 02/12/21   [provider]    Allergies: Fish  allergy, Justicia adhatoda, Mango flavoring agent (non-screening), Other, Peanuts [peanut oil], Penicillins, Shellfish allergy, Ciprofloxacin hcl, Coconut fatty acid, Fluorescein -benoxinate, Seconal [secobarbital sodium], Turmeric, Ace inhibitors, Alcohol-sulfur [elemental sulfur], Aspirin , Beta adrenergic blockers, Brimonidine , Caffeine, Iodine solution [povidone iodine], Latex, Milk-related compounds, Motrin [ibuprofen], Norvasc [amlodipine], and Sulfa antibiotics    Review of Systems  Unable to perform ROS: Acuity of condition (Level 5 caveat)  Respiratory:  Positive for shortness of breath.     Updated Vital Signs BP (!) 157/76   Pulse 60   Temp 98 F (36.7 C) (Oral)   Resp 17   SpO2 100%   Physical Exam Vitals and nursing note reviewed.  Constitutional:      General: She is not in acute distress.    Appearance: She is well-developed.  HENT:     Head: Normocephalic and atraumatic.   Eyes:     Conjunctiva/sclera: Conjunctivae normal.    Cardiovascular:     Rate and Rhythm: Normal rate and regular rhythm.     Heart sounds: No murmur heard. Pulmonary:     Effort: Pulmonary effort is normal. No respiratory distress.     Breath sounds: Normal breath sounds.  Abdominal:     Palpations: Abdomen is soft.     Tenderness: There is no abdominal tenderness.   Musculoskeletal:        General: No swelling.     Cervical back: Neck supple.   Skin:    General: Skin is warm and dry.     Capillary Refill: Capillary refill takes less than 2 seconds.   Neurological:     Mental Status: She is alert.     Comments: Patient unable to name simple objects.  Patient unable to speak.  Will utter nonsensical words.  Unable to follow commands, cannot assess strength.  PERRL.  Psychiatric:        Mood and Affect: Mood normal.     (all labs ordered are listed, but only abnormal results are displayed) Labs Reviewed  COMPREHENSIVE METABOLIC PANEL WITH GFR - Abnormal; Notable for the following  components:      Result Value   Potassium 3.3 (*)    Creatinine, Ser 1.06 (*)    GFR, Estimated 53 (*)    All other components within normal limits  I-STAT CHEM 8, ED - Abnormal; Notable for the following components:   Potassium 3.1 (*)    Creatinine, Ser 1.10 (*)    Glucose, Bld 100 (*)    Calcium , Ion 1.12 (*)    TCO2 19 (*)    All other components within normal limits  CBG MONITORING, ED - Abnormal; Notable for the following components:   Glucose-Capillary 100 (*)    All other components within normal limits  URINALYSIS, ROUTINE W REFLEX MICROSCOPIC  ETHANOL  PROTIME-INR  APTT  RAPID URINE DRUG  SCREEN, HOSP PERFORMED  CBC WITH DIFFERENTIAL/PLATELET  VITAMIN B12  FOLATE  TSH  AMMONIA  T4, FREE  VITAMIN B1    EKG: EKG Interpretation Date/Time:  Wednesday February 08 2024 22:26:05 EDT Ventricular Rate:  80 PR Interval:  148 QRS Duration:  180 QT Interval:  405 QTC Calculation: 468 R Axis:   101  Text Interpretation: Sinus rhythm Right atrial enlargement RBBB and LPFB Artifact in lead(s) I II III aVR aVL aVF V1 V2 V4 V6 When compared with ECG of 02/01/2024, No significant change was found Confirmed by Raford Lenis ([id number]) on 02/09/2024 3:09:40 AM  Radiology: MR BRAIN WO CONTRAST Result Date: 02/09/2024 CLINICAL DATA:  Initial evaluation for acute neuro deficit, stroke suspected. EXAM: MRI HEAD WITHOUT CONTRAST TECHNIQUE: Multiplanar, multiecho pulse sequences of the brain and surrounding structures were obtained without intravenous contrast. COMPARISON:  CTs from 02/08/2024. FINDINGS: Brain: Cerebral volume within normal limits for age. Patchy T2/FLAIR hyperintensity involving the periventricular deep white matter both cerebral hemispheres, consistent with chronic small vessel ischemic disease, moderate in nature. Remote lacunar infarct present at the left thalamus. No abnormal foci of restricted diffusion to suggest acute or subacute ischemia. Gray-white matter differentiation  maintained. No areas of chronic cortical infarction. No acute intracranial hemorrhage. Few scattered chronic micro hemorrhages noted, likely hypertensive in nature. No mass lesion, midline shift or mass effect. No hydrocephalus or extra-axial fluid collection. Pituitary gland within normal limits. Vascular: Major intracranial vascular flow voids are maintained. Skull and upper cervical spine: Craniocervical junction within normal limits. Bone marrow signal intensity normal. No scalp soft tissue abnormality. Sinuses/Orbits: Prior bilateral ocular lens replacement. Paranasal sinuses are largely clear. Trace bilateral mastoid effusions noted, of doubtful significance. Other: None. IMPRESSION: 1. No acute intracranial abnormality. 2. Moderate chronic microvascular ischemic disease with remote left thalamic lacunar infarct. Electronically Signed   By: Morene Hoard M.D.   On: 02/09/2024 02:59   CT HEAD CODE STROKE WO CONTRAST Result Date: 02/09/2024 CLINICAL DATA:  Initial evaluation for acute neuro deficit, stroke suspected. No other relevant history provided. EXAM: CT HEAD WITHOUT CONTRAST CT ANGIOGRAPHY HEAD AND NECK TECHNIQUE: Multidetector CT imaging of the head and neck was performed using the standard protocol during bolus administration of intravenous contrast. Multiplanar CT image reconstructions and MIPs were obtained to evaluate the vascular anatomy. Carotid stenosis measurements (when applicable) are obtained utilizing NASCET criteria, using the distal internal carotid diameter as the denominator. RADIATION DOSE REDUCTION: This exam was performed according to the departmental dose-optimization program which includes automated exposure control, adjustment of the mA and/or kV according to patient size and/or use of iterative reconstruction technique. CONTRAST:  75mL OMNIPAQUE  IOHEXOL  350 MG/ML SOLN COMPARISON:  Prior study from 02/01/2024. FINDINGS: CT HEAD FINDINGS Brain: Cerebral volume within  normal limits. Chronic microvascular ischemic disease with a few small remote lacunar infarcts about the basal ganglia and thalami. No acute intracranial hemorrhage. No acute large vessel territory infarct. No mass lesion or midline shift. No hydrocephalus or extra-axial fluid collection. Vascular: No abnormal hyperdense vessel. Scattered vascular calcifications noted within the carotid siphons. Skull: Scalp soft tissues within normal limits.  Calvarium intact. Sinuses/Orbits: Globes orbital soft tissues within normal limits. Scattered mucosal thickening present about the ethmoidal air cells. Paranasal sinuses are otherwise clear. No significant mastoid effusion. Other: None. Review of the MIP images confirms the above findings CTA NECK FINDINGS Aortic arch: Standard branching. Imaged portion shows no evidence of aneurysm or dissection. No significant stenosis of the major arch vessel origins. Right  carotid system: No evidence of dissection, stenosis (50% or greater), or occlusion. Left carotid system: No evidence of dissection, stenosis (50% or greater), or occlusion. Vertebral arteries: Codominant. No evidence of dissection, stenosis (50% or greater), Skeleton: No worrisome osseous lesions. Moderately advanced cervical spondylosis. Other neck: No other acute finding. Upper chest: Few small pulmonary nodules present within the left upper lobe, largest of which measures 5 mm (series 15, image 24). No other acute finding. Review of the MIP images confirms the above findings CTA HEAD FINDINGS Anterior circulation: Atheromatous change about the carotid siphons without hemodynamically significant stenosis. A1 segments patent bilaterally. Normal anterior communicating artery complex. Anterior cerebral arteries widely patent. No M1 stenosis or occlusion. Distal MCA branches perfused and symmetric. Posterior circulation: Both V4 segments patent without significant stenosis. Both PICA patent at their origins. Basilar patent  without stenosis. Superior cerebral arteries patent bilaterally. Both PCA supplied via the basilar as well as small bilateral posterior arteries. Left PCA patent without stenosis. Focal moderate proximal right P2 stenosis (series 20, image 44). Right PCA otherwise patent. Venous sinuses: Grossly patent allowing for timing the contrast bolus. Anatomic variants: None significant.  No aneurysm. Review of the MIP images confirms the above findings IMPRESSION: CT HEAD: 1. No acute intracranial abnormality. 2. Aspects is 10. 3. Chronic microvascular ischemic disease with a few small remote lacunar infarcts about the basal ganglia and thalami. CTA HEAD AND NECK: 1. Negative CTA for large vessel occlusion or other emergent finding. 2. Focal moderate proximal right P2 stenosis. 3. Otherwise wide patency of the major arterial vasculature of the head and neck. No other hemodynamically significant or correctable stenosis. 4. Few small pulmonary nodules within the left upper lobe, largest of which measures 5 mm. Per Fleischner Society Guidelines, if patient is low risk for malignancy, no routine follow-up imaging is recommended. If patient is high risk for malignancy, a non-contrast Chest CT at 12 months is optional. If performed and the nodule is stable at 12 months, no further follow-up is recommended. These guidelines do not apply to immunocompromised patients and patients with cancer. Follow up in patients with significant comorbidities as clinically warranted. For lung cancer screening, adhere to Lung-RADS guidelines. Reference: Radiology. 2017; 284(1):228-43. Results were called by telephone at the time of interpretation on 02/09/2024 at 12:09 am to provider [facility] , who verbally acknowledged these results. Electronically Signed   By: Morene Hoard M.D.   On: 02/09/2024 00:13   CT ANGIO HEAD NECK W WO CM (CODE STROKE) Result Date: 02/09/2024 CLINICAL DATA:  Initial evaluation for acute neuro deficit,  stroke suspected. No other relevant history provided. EXAM: CT HEAD WITHOUT CONTRAST CT ANGIOGRAPHY HEAD AND NECK TECHNIQUE: Multidetector CT imaging of the head and neck was performed using the standard protocol during bolus administration of intravenous contrast. Multiplanar CT image reconstructions and MIPs were obtained to evaluate the vascular anatomy. Carotid stenosis measurements (when applicable) are obtained utilizing NASCET criteria, using the distal internal carotid diameter as the denominator. RADIATION DOSE REDUCTION: This exam was performed according to the departmental dose-optimization program which includes automated exposure control, adjustment of the mA and/or kV according to patient size and/or use of iterative reconstruction technique. CONTRAST:  75mL OMNIPAQUE  IOHEXOL  350 MG/ML SOLN COMPARISON:  Prior study from 02/01/2024. FINDINGS: CT HEAD FINDINGS Brain: Cerebral volume within normal limits. Chronic microvascular ischemic disease with a few small remote lacunar infarcts about the basal ganglia and thalami. No acute intracranial hemorrhage. No acute large vessel territory infarct. No mass  lesion or midline shift. No hydrocephalus or extra-axial fluid collection. Vascular: No abnormal hyperdense vessel. Scattered vascular calcifications noted within the carotid siphons. Skull: Scalp soft tissues within normal limits.  Calvarium intact. Sinuses/Orbits: Globes orbital soft tissues within normal limits. Scattered mucosal thickening present about the ethmoidal air cells. Paranasal sinuses are otherwise clear. No significant mastoid effusion. Other: None. Review of the MIP images confirms the above findings CTA NECK FINDINGS Aortic arch: Standard branching. Imaged portion shows no evidence of aneurysm or dissection. No significant stenosis of the major arch vessel origins. Right carotid system: No evidence of dissection, stenosis (50% or greater), or occlusion. Left carotid system: No evidence of  dissection, stenosis (50% or greater), or occlusion. Vertebral arteries: Codominant. No evidence of dissection, stenosis (50% or greater), Skeleton: No worrisome osseous lesions. Moderately advanced cervical spondylosis. Other neck: No other acute finding. Upper chest: Few small pulmonary nodules present within the left upper lobe, largest of which measures 5 mm (series 15, image 24). No other acute finding. Review of the MIP images confirms the above findings CTA HEAD FINDINGS Anterior circulation: Atheromatous change about the carotid siphons without hemodynamically significant stenosis. A1 segments patent bilaterally. Normal anterior communicating artery complex. Anterior cerebral arteries widely patent. No M1 stenosis or occlusion. Distal MCA branches perfused and symmetric. Posterior circulation: Both V4 segments patent without significant stenosis. Both PICA patent at their origins. Basilar patent without stenosis. Superior cerebral arteries patent bilaterally. Both PCA supplied via the basilar as well as small bilateral posterior arteries. Left PCA patent without stenosis. Focal moderate proximal right P2 stenosis (series 20, image 44). Right PCA otherwise patent. Venous sinuses: Grossly patent allowing for timing the contrast bolus. Anatomic variants: None significant.  No aneurysm. Review of the MIP images confirms the above findings IMPRESSION: CT HEAD: 1. No acute intracranial abnormality. 2. Aspects is 10. 3. Chronic microvascular ischemic disease with a few small remote lacunar infarcts about the basal ganglia and thalami. CTA HEAD AND NECK: 1. Negative CTA for large vessel occlusion or other emergent finding. 2. Focal moderate proximal right P2 stenosis. 3. Otherwise wide patency of the major arterial vasculature of the head and neck. No other hemodynamically significant or correctable stenosis. 4. Few small pulmonary nodules within the left upper lobe, largest of which measures 5 mm. Per Fleischner  Society Guidelines, if patient is low risk for malignancy, no routine follow-up imaging is recommended. If patient is high risk for malignancy, a non-contrast Chest CT at 12 months is optional. If performed and the nodule is stable at 12 months, no further follow-up is recommended. These guidelines do not apply to immunocompromised patients and patients with cancer. Follow up in patients with significant comorbidities as clinically warranted. For lung cancer screening, adhere to Lung-RADS guidelines. Reference: Radiology. 2017; 284(1):228-43. Results were called by telephone at the time of interpretation on 02/09/2024 at 12:09 am to provider [facility] , who verbally acknowledged these results. Electronically Signed   By: Morene Hoard M.D.   On: 02/09/2024 00:13   DG Chest 2 View Result Date: 02/08/2024 CLINICAL DATA:  sob EXAM: CHEST - 2 VIEW COMPARISON:  Chest x-ray 02/01/2024, CT angio chest 02/01/2024 FINDINGS: The heart and mediastinal contours are unchanged. Atherosclerotic plaque. No focal consolidation. Chronic coarsened interstitial markings with no overt pulmonary edema. No pleural effusion. No pneumothorax. No acute osseous abnormality. IMPRESSION: 1. No active cardiopulmonary disease. 2. Aortic Atherosclerosis (ICD10-I70.0) and Emphysema (ICD10-J43.9). Electronically Signed   By: Morgane  Naveau M.D.   On: 02/08/2024  22:55     .Critical Care  Performed by: Ruthell Lonni FALCON, PA-C Authorized by: Ruthell Lonni FALCON, PA-C   Critical care provider statement:    Critical care was necessary to treat or prevent imminent or life-threatening deterioration of the following conditions:  CNS failure or compromise   Critical care was time spent personally by me on the following activities:  Blood draw for specimens, development of treatment plan with patient or surrogate, discussions with consultants, discussions with primary provider, evaluation of patient's response to treatment,  examination of patient, interpretation of cardiac output measurements, obtaining history from patient or surrogate, ordering and review of radiographic studies, pulse oximetry, re-evaluation of patient's condition, ordering and performing treatments and interventions, ordering and review of laboratory studies and review of old charts   I assumed direction of critical care for this patient from another provider in my specialty: no     Care discussed with: accepting provider at another facility      Medications Ordered in the ED  hydrALAZINE  (APRESOLINE ) tablet 50 mg (50 mg Oral Given 02/09/24 0602)  irbesartan  (AVAPRO ) tablet 150 mg (has no administration in time range)  enoxaparin  (LOVENOX ) injection 30 mg (has no administration in time range)  sodium chloride  flush (NS) 0.9 % injection 3 mL (has no administration in time range)  acetaminophen  (TYLENOL ) tablet 650 mg (has no administration in time range)    Or  acetaminophen  (TYLENOL ) suppository 650 mg (has no administration in time range)  polyethylene glycol (MIRALAX / GLYCOLAX) packet 17 g (has no administration in time range)  ondansetron  (ZOFRAN ) tablet 4 mg (has no administration in time range)    Or  ondansetron  (ZOFRAN ) injection 4 mg (has no administration in time range)  LORazepam  (ATIVAN ) 2 MG/ML injection (0.5 mg  Given 02/08/24 2350)  iohexol  (OMNIPAQUE ) 350 MG/ML injection 75 mL (75 mLs Intravenous Contrast Given 02/08/24 2351)  LORazepam  (ATIVAN ) tablet 1 mg (1 mg Oral Given 02/09/24 0203)  potassium chloride  SA (KLOR-CON  M) CR tablet 20 mEq (20 mEq Oral Given 02/09/24 0603)     Medical Decision Making Amount and/or Complexity of Data Reviewed Labs: ordered. Radiology: ordered.  Risk Prescription drug management. Decision regarding hospitalization.   78 year old female presents to the ED for evaluation.  Please see HPI for further details.  On my initial examination, the patient is laying in bed.  She is unable to  follow simple commands.  She will not allow me to assess her strength.  She is unable to name simple objects.  She utters nonsensical words.  PERRL.  She is afebrile and nontachycardic.  She is not hypoxic.  Her lung sounds are clear bilaterally.  After initial exam, called attending Dr. Griselda to the bedside out of concern for neurological event.  Dr. Griselda assessed patient and determined code stroke should be called at this time.  Code stroke was called.  Labs were drawn which showed a potassium 3.3 a creatinine 1.06 which appears to be patient baseline.  Further lab work is grossly unremarkable.  Discussed with on-call neurologist.  We feel the patient presentation most likely consistent with panic attack versus some kind of other underlying psychiatric illness.  This is her third visit in the last week for similar presentation.  Despite this, I feel the patient requires further workup to rule out any kind of underlying sinister cause of her presentation.  CT head, CTA head and neck were both negative for acute abnormality.  Patient was transferred at this time to  Karla Richards for MRI.  I did discuss with attending Dr. Raford at [facility] and he did accept patient for transfer.  Patient was stable prior to transfer.    Final diagnoses:  Aphasia    ED Discharge Orders     None          Ruthell Lonni JULIANNA DEVONNA 02/09/24 [id number]    Griselda Norris, MD 02/09/24 [phone]

## 2024-02-08 NOTE — ED Provider Notes (Incomplete)
  EMERGENCY DEPARTMENT AT [facility] Provider Note   CSN: [id number] Arrival date & time: 02/08/24  2210     Patient presents with: Shortness of Breath   Karla Richards is a 78 y.o. female with medical history of TIA, retention.  Patient not on blood thinners.  Presents to ED for complaint of shortness of breath.  Husband at the bedside provides the majority of the history.  Per patient husband, the patient has been to the ED 3 times this week.  This is her third visit.  According patient has been, patient initially presented on 6/19 out of concern for shortness of breath.  Unremarkable workup was obtained and patient was discharged home.  She returned a few days later citing similar symptoms but apparently saw a friend of hers in the lobby and requested to go home a few hours later.  The patient husband states that she had a PCP follow-up appointment today where she had labs drawn.  Reports that she then went home around 530, ate pancakes and lay down for sleep which is at her typical routine.  He reports that she woke up around 10 PM, hunched over walking to him stating that she needed to go to the hospital.  She was complaining of shortness of breath.  On my examination, the patient is unable to follow commands.  She is unable to find her words.  She appears to be having some kind of possible panic attack however due to the fact that she cannot provide her words, cannot name simple objects and cannot follow commands I had my attending Dr. Griselda come to the bedside and evaluate the patient.  Code stroke was called this time after discussion and evaluation with Dr. Griselda.    Shortness of Breath      Prior to Admission medications   Medication Sig Start Date End Date Taking? Authorizing Provider  Abaloparatide (TYMLOS) 3120 MCG/1.56ML SOPN Inject 80 mcg into the skin daily. 02/08/24   Cleatrice Ludie SAUNDERS, MD  b complex vitamins capsule Take 1 capsule by mouth daily.     [provider]  Calcium  Carbonate-Vit D-Min (CALCIUM  600+D PLUS MINERALS) 600-400 MG-UNIT TABS Take 1 tablet by mouth in the morning and at bedtime. 12/14/23   Hudnall, Ludie SAUNDERS, MD  cetirizine  (ZYRTEC ) 5 MG tablet Take 1 tablet (5 mg total) by mouth daily. 08/04/22   Rising, Asberry, PA-C  diphenhydrAMINE  (BENADRYL ) 25 MG tablet Take 50 mg by mouth 2 (two) times daily as needed for allergies or itching.    [provider]  famotidine  (PEPCID ) 10 MG tablet Take 10 mg by mouth 2 (two) times daily as needed for heartburn or indigestion.    [provider]  fluoruracil (CARAC ) 0.5 % cream Apply 1 application topically daily as needed (skin cancer flare up).    [provider]  hydrALAZINE  (APRESOLINE ) 50 MG tablet Take 50 mg by mouth daily.    [provider]  Insulin Pen Needle (ASSURE ID DUO PRO PEN NEEDLES) 31G X 5 MM MISC 1 each by Does not apply route daily. 02/08/24   Cleatrice Ludie SAUNDERS, MD  isosorbide  mononitrate (IMDUR ) 60 MG 24 hr tablet Take 1 tablet (60 mg total) by mouth daily. 06/12/22   Odell Celinda Balo, MD  Multiple Vitamins-Minerals (MULTIVITAMIN ADULTS PO) Take 1 tablet by mouth daily.    [provider]  olopatadine  (PATANOL) 0.1 % ophthalmic solution Place 1 drop into both eyes 2 (two) times daily. 08/04/22  Rising, Asberry, PA-C  pantoprazole  (PROTONIX ) 40 MG tablet Take 1 tablet (40 mg total) by mouth daily. 10/21/22   Patsey Lot, MD  tretinoin  (RETIN-A ) 0.05 % cream Apply 1 application topically at bedtime. 11/23/19   [provider]  valsartan (DIOVAN) 80 MG tablet Take 160 mg by mouth daily. 11/21/18   [provider]  Vitamin D , Ergocalciferol , (DRISDOL ) 1.25 MG (50000 UNIT) CAPS capsule Take 1 capsule (50,000 Units total) by mouth every 7 (seven) days. 12/14/23   Hudnall, Ludie SAUNDERS, MD  ZIOPTAN  0.0015 % SOLN Place 1 drop into the left eye at bedtime. 02/12/21   [provider]    Allergies: Fish  allergy, Justicia adhatoda, Mango flavoring agent (non-screening), Other, Peanuts [peanut oil], Penicillins, Shellfish allergy, Ciprofloxacin hcl, Coconut fatty acid, Fluorescein -benoxinate, Seconal [secobarbital sodium], Turmeric, Ace inhibitors, Alcohol-sulfur [elemental sulfur], Aspirin , Beta adrenergic blockers, Brimonidine , Caffeine, Iodine solution [povidone iodine], Latex, Milk-related compounds, Motrin [ibuprofen], Norvasc [amlodipine], and Sulfa antibiotics    Review of Systems  Respiratory:  Positive for shortness of breath.     Updated Vital Signs BP (!) 162/93   Pulse 79   Temp 97.7 F (36.5 C) (Oral)   Resp (!) 25   SpO2 100%   Physical Exam  (all labs ordered are listed, but only abnormal results are displayed) Labs Reviewed  COMPREHENSIVE METABOLIC PANEL WITH GFR  CBC  URINALYSIS, ROUTINE W REFLEX MICROSCOPIC  ETHANOL  PROTIME-INR  APTT  DIFFERENTIAL  RAPID URINE DRUG SCREEN, HOSP PERFORMED  I-STAT CHEM 8, ED    EKG: None  Radiology: DG Chest 2 View Result Date: 02/08/2024 CLINICAL DATA:  sob EXAM: CHEST - 2 VIEW COMPARISON:  Chest x-ray 02/01/2024, CT angio chest 02/01/2024 FINDINGS: The heart and mediastinal contours are unchanged. Atherosclerotic plaque. No focal consolidation. Chronic coarsened interstitial markings with no overt pulmonary edema. No pleural effusion. No pneumothorax. No acute osseous abnormality. IMPRESSION: 1. No active cardiopulmonary disease. 2. Aortic Atherosclerosis (ICD10-I70.0) and Emphysema (ICD10-J43.9). Electronically Signed   By: Morgane  Naveau M.D.   On: 02/08/2024 22:55    {Document cardiac monitor, telemetry assessment procedure when appropriate:32947} Procedures   Medications Ordered in the ED - No data to display    {Click here for ABCD2, HEART and other calculators REFRESH Note before signing:1}                              Medical Decision Making Amount and/or Complexity of Data Reviewed Labs: ordered. Radiology:  ordered.   ***  {Document critical care time when appropriate  Document review of labs and clinical decision tools ie CHADS2VASC2, etc  Document your independent review of radiology images and any outside records  Document your discussion with family members, caretakers and with consultants  Document social determinants of health affecting pt's care  Document your decision making why or why not admission, treatments were needed:32947:::1}   Final diagnoses:  None    ED Discharge Orders     None

## 2024-02-09 ENCOUNTER — Encounter (HOSPITAL_COMMUNITY): Payer: Self-pay | Admitting: Family Medicine

## 2024-02-09 ENCOUNTER — Other Ambulatory Visit: Payer: Self-pay

## 2024-02-09 ENCOUNTER — Emergency Department (HOSPITAL_COMMUNITY)

## 2024-02-09 ENCOUNTER — Ambulatory Visit: Payer: Self-pay | Admitting: Family Medicine

## 2024-02-09 ENCOUNTER — Telehealth: Payer: Self-pay

## 2024-02-09 DIAGNOSIS — I10 Essential (primary) hypertension: Secondary | ICD-10-CM

## 2024-02-09 DIAGNOSIS — N1831 Chronic kidney disease, stage 3a: Secondary | ICD-10-CM | POA: Diagnosis not present

## 2024-02-09 DIAGNOSIS — R4781 Slurred speech: Secondary | ICD-10-CM | POA: Diagnosis not present

## 2024-02-09 DIAGNOSIS — F410 Panic disorder [episodic paroxysmal anxiety] without agoraphobia: Secondary | ICD-10-CM | POA: Diagnosis not present

## 2024-02-09 DIAGNOSIS — R4189 Other symptoms and signs involving cognitive functions and awareness: Secondary | ICD-10-CM

## 2024-02-09 DIAGNOSIS — R4701 Aphasia: Secondary | ICD-10-CM | POA: Diagnosis present

## 2024-02-09 DIAGNOSIS — G9340 Encephalopathy, unspecified: Secondary | ICD-10-CM

## 2024-02-09 DIAGNOSIS — E876 Hypokalemia: Secondary | ICD-10-CM

## 2024-02-09 DIAGNOSIS — Z7401 Bed confinement status: Secondary | ICD-10-CM | POA: Diagnosis not present

## 2024-02-09 DIAGNOSIS — R0600 Dyspnea, unspecified: Secondary | ICD-10-CM | POA: Diagnosis not present

## 2024-02-09 LAB — I-STAT CHEM 8, ED
BUN: 14 mg/dL (ref 8–23)
Calcium, Ion: 1.12 mmol/L — ABNORMAL LOW (ref 1.15–1.40)
Chloride: 104 mmol/L (ref 98–111)
Creatinine, Ser: 1.1 mg/dL — ABNORMAL HIGH (ref 0.44–1.00)
Glucose, Bld: 100 mg/dL — ABNORMAL HIGH (ref 70–99)
HCT: 36 % (ref 36.0–46.0)
Hemoglobin: 12.2 g/dL (ref 12.0–15.0)
Potassium: 3.1 mmol/L — ABNORMAL LOW (ref 3.5–5.1)
Sodium: 135 mmol/L (ref 135–145)
TCO2: 19 mmol/L — ABNORMAL LOW (ref 22–32)

## 2024-02-09 LAB — BASIC METABOLIC PANEL WITH GFR
Anion gap: 8 (ref 5–15)
BUN: 15 mg/dL (ref 8–23)
CO2: 24 mmol/L (ref 22–32)
Calcium: 10.5 mg/dL — ABNORMAL HIGH (ref 8.9–10.3)
Chloride: 104 mmol/L (ref 98–111)
Creatinine, Ser: 1.01 mg/dL — ABNORMAL HIGH (ref 0.44–1.00)
GFR, Estimated: 57 mL/min — ABNORMAL LOW (ref >60)
Glucose, Bld: 78 mg/dL (ref 70–99)
Potassium: 5.7 mmol/L — ABNORMAL HIGH (ref 3.5–5.1)
Sodium: 136 mmol/L (ref 135–145)

## 2024-02-09 LAB — COMPREHENSIVE METABOLIC PANEL WITH GFR
ALT: 14 U/L (ref 0–44)
AST: 28 U/L (ref 15–41)
Albumin: 4.4 g/dL (ref 3.5–5.0)
Alkaline Phosphatase: 65 U/L (ref 38–126)
Anion gap: 12 (ref 5–15)
BUN: 16 mg/dL (ref 8–23)
CO2: 22 mmol/L (ref 22–32)
Calcium: 10.3 mg/dL (ref 8.9–10.3)
Chloride: 103 mmol/L (ref 98–111)
Creatinine, Ser: 1.06 mg/dL — ABNORMAL HIGH (ref 0.44–1.00)
GFR, Estimated: 53 mL/min — ABNORMAL LOW (ref >60)
Glucose, Bld: 89 mg/dL (ref 70–99)
Potassium: 3.3 mmol/L — ABNORMAL LOW (ref 3.5–5.1)
Sodium: 137 mmol/L (ref 135–145)
Total Bilirubin: 0.8 mg/dL (ref 0.0–1.2)
Total Protein: 7.5 g/dL (ref 6.5–8.1)

## 2024-02-09 LAB — URINALYSIS, ROUTINE W REFLEX MICROSCOPIC
Bilirubin Urine: NEGATIVE
Glucose, UA: NEGATIVE mg/dL
Hgb urine dipstick: NEGATIVE
Ketones, ur: NEGATIVE mg/dL
Leukocytes,Ua: NEGATIVE
Nitrite: NEGATIVE
Protein, ur: NEGATIVE mg/dL
Specific Gravity, Urine: 1.012 (ref 1.005–1.030)
pH: 8 (ref 5.0–8.0)

## 2024-02-09 LAB — RAPID URINE DRUG SCREEN, HOSP PERFORMED
Amphetamines: NOT DETECTED
Barbiturates: NOT DETECTED
Benzodiazepines: NOT DETECTED
Cocaine: NOT DETECTED
Opiates: NOT DETECTED
Tetrahydrocannabinol: NOT DETECTED

## 2024-02-09 LAB — PHOSPHORUS: Phosphorus: 2.9 mg/dL (ref 2.5–4.6)

## 2024-02-09 LAB — AMMONIA: Ammonia: <13 umol/L (ref 9–35)

## 2024-02-09 LAB — TSH: TSH: 9.839 u[IU]/mL — ABNORMAL HIGH (ref 0.350–4.500)

## 2024-02-09 LAB — MAGNESIUM: Magnesium: 2.2 mg/dL (ref 1.7–2.4)

## 2024-02-09 LAB — PROTIME-INR
INR: 1 (ref 0.8–1.2)
Prothrombin Time: 13.9 s (ref 11.4–15.2)

## 2024-02-09 LAB — VITAMIN D 25 HYDROXY (VIT D DEFICIENCY, FRACTURES): Vit D, 25-Hydroxy: 52.6 ng/mL (ref 30.0–100.0)

## 2024-02-09 LAB — FOLATE: Folate: 29.6 ng/mL (ref >5.9)

## 2024-02-09 LAB — ETHANOL: Alcohol, Ethyl (B): <15 mg/dL (ref <15)

## 2024-02-09 LAB — T4, FREE: Free T4: 1 ng/dL (ref 0.61–1.12)

## 2024-02-09 LAB — APTT: aPTT: 34 s (ref 24–36)

## 2024-02-09 LAB — VITAMIN B12: Vitamin B-12: 88 pg/mL — ABNORMAL LOW (ref 180–914)

## 2024-02-09 MED ORDER — METOPROLOL TARTRATE 5 MG/5ML IV SOLN: 5.0000 mg | INTRAVENOUS | Status: DC | PRN

## 2024-02-09 MED ORDER — GLUCAGON HCL RDNA (DIAGNOSTIC) 1 MG IJ SOLR: 1.0000 mg | INTRAMUSCULAR | Status: DC | PRN

## 2024-02-09 MED ORDER — ONDANSETRON HCL 4 MG PO TABS: 4.0000 mg | ORAL_TABLET | Freq: Four times a day (QID) | ORAL | Status: DC | PRN

## 2024-02-09 MED ORDER — CALCIUM GLUCONATE-NACL 2-0.675 GM/100ML-% IV SOLN
2.0000 g | Freq: Once | INTRAVENOUS | Status: AC
  Administered 2024-02-09: 2000 mg via INTRAVENOUS
  Filled 2024-02-09: qty 100

## 2024-02-09 MED ORDER — SENNOSIDES-DOCUSATE SODIUM 8.6-50 MG PO TABS: 1.0000 | ORAL_TABLET | Freq: Every evening | ORAL | Status: DC | PRN

## 2024-02-09 MED ORDER — HYDRALAZINE HCL 20 MG/ML IJ SOLN: 10.0000 mg | INTRAMUSCULAR | Status: DC | PRN

## 2024-02-09 MED ORDER — HYDRALAZINE HCL 50 MG PO TABS
50.0000 mg | ORAL_TABLET | Freq: Four times a day (QID) | ORAL | Status: DC
  Administered 2024-02-09 – 2024-02-10 (×4): 50 mg via ORAL
  Filled 2024-02-09: qty 1
  Filled 2024-02-09: qty 2
  Filled 2024-02-09: qty 1
  Filled 2024-02-09: qty 2

## 2024-02-09 MED ORDER — ENOXAPARIN SODIUM 30 MG/0.3ML IJ SOSY
30.0000 mg | PREFILLED_SYRINGE | INTRAMUSCULAR | Status: DC
  Administered 2024-02-09: 30 mg via SUBCUTANEOUS
  Filled 2024-02-09 (×2): qty 0.3

## 2024-02-09 MED ORDER — SODIUM CHLORIDE 0.9% FLUSH
3.0000 mL | Freq: Two times a day (BID) | INTRAVENOUS | Status: DC
  Administered 2024-02-09 – 2024-02-10 (×3): 3 mL via INTRAVENOUS

## 2024-02-09 MED ORDER — POTASSIUM CHLORIDE 20 MEQ PO PACK
60.0000 meq | PACK | Freq: Once | ORAL | Status: AC
  Administered 2024-02-09: 60 meq via ORAL
  Filled 2024-02-09: qty 3

## 2024-02-09 MED ORDER — POTASSIUM CHLORIDE CRYS ER 20 MEQ PO TBCR
20.0000 meq | EXTENDED_RELEASE_TABLET | Freq: Once | ORAL | Status: AC
  Administered 2024-02-09: 20 meq via ORAL
  Filled 2024-02-09: qty 1

## 2024-02-09 MED ORDER — IRBESARTAN 150 MG PO TABS
150.0000 mg | ORAL_TABLET | Freq: Every day | ORAL | Status: DC
  Administered 2024-02-09: 150 mg via ORAL
  Filled 2024-02-09 (×2): qty 1

## 2024-02-09 MED ORDER — POLYETHYLENE GLYCOL 3350 17 G PO PACK: 17.0000 g | PACK | Freq: Every day | ORAL | Status: DC | PRN

## 2024-02-09 MED ORDER — ACETAMINOPHEN 325 MG PO TABS
650.0000 mg | ORAL_TABLET | Freq: Four times a day (QID) | ORAL | Status: DC | PRN
Start: 2024-02-09 — End: 2024-02-10

## 2024-02-09 MED ORDER — ACETAMINOPHEN 650 MG RE SUPP: 650.0000 mg | Freq: Four times a day (QID) | RECTAL | Status: DC | PRN

## 2024-02-09 MED ORDER — VITAMIN B-12 1000 MCG PO TABS
1000.0000 ug | ORAL_TABLET | Freq: Every day | ORAL | Status: DC
  Administered 2024-02-09: 1000 ug via ORAL
  Filled 2024-02-09 (×2): qty 1

## 2024-02-09 MED ORDER — LORAZEPAM 1 MG PO TABS
1.0000 mg | ORAL_TABLET | Freq: Once | ORAL | Status: AC
  Administered 2024-02-09: 1 mg via ORAL
  Filled 2024-02-09: qty 1

## 2024-02-09 MED ORDER — ONDANSETRON HCL 4 MG/2ML IJ SOLN: 4.0000 mg | Freq: Four times a day (QID) | INTRAMUSCULAR | Status: DC | PRN

## 2024-02-09 MED ORDER — POTASSIUM CHLORIDE 10 MEQ/100ML IV SOLN
10.0000 meq | INTRAVENOUS | Status: AC
  Administered 2024-02-09 (×2): 10 meq via INTRAVENOUS
  Filled 2024-02-09 (×2): qty 100

## 2024-02-09 MED ORDER — IPRATROPIUM-ALBUTEROL 0.5-2.5 (3) MG/3ML IN SOLN: 3.0000 mL | RESPIRATORY_TRACT | Status: DC | PRN

## 2024-02-09 NOTE — Plan of Care (Signed)
  Problem: Clinical Measurements: Goal: Will remain free from infection Outcome: Progressing Goal: Diagnostic test results will improve Outcome: Progressing Goal: Respiratory complications will improve Outcome: Progressing Goal: Cardiovascular complication will be avoided Outcome: Progressing   Problem: Nutrition: Goal: Adequate nutrition will be maintained Outcome: Progressing   Problem: Elimination: Goal: Will not experience complications related to urinary retention Outcome: Progressing   Problem: Pain Managment: Goal: General experience of comfort will improve and/or be controlled Outcome: Progressing   Problem: Safety: Goal: Ability to remain free from injury will improve Outcome: Progressing   Problem: Skin Integrity: Goal: Risk for impaired skin integrity will decrease Outcome: Progressing   Problem: Education: Goal: Knowledge of General Education information will improve Description: Including pain rating scale, medication(s)/side effects and non-pharmacologic comfort measures Outcome: Not Progressing   Problem: Health Behavior/Discharge Planning: Goal: Ability to manage health-related needs will improve Outcome: Not Progressing   Problem: Clinical Measurements: Goal: Ability to maintain clinical measurements within normal limits will improve Outcome: Not Progressing   Problem: Activity: Goal: Risk for activity intolerance will decrease Outcome: Not Progressing   Problem: Coping: Goal: Level of anxiety will decrease Outcome: Not Progressing

## 2024-02-09 NOTE — Hospital Course (Addendum)
 Brief Narrative:  78 year old with history of HTN, CKD 3A, osteoporosis, extensive allergy list admitted for acute shortness of breath and speech difficulty.  Apparently patient was having difficulty explaining her symptoms.  Initially short of breath but it resolved in the ER.  CT head, chest x-ray were negative.  Teleneurology recommended transferring patient from [facility] to [facility].  MRI brain is also unremarkable. During the hospitalization she also required electrolyte repletion and was started on B12 supplements due to B12 deficiency.  Assessment & Plan:  Principal Problem:   Acute encephalopathy Active Problems:   HTN (hypertension)   Stage 3a chronic kidney disease (HCC)   Hypokalemia    Acute encephalopathy Seen by teleneurology.  CT head, MRI brain are negative.  Ammonia, folate are normal.  Elevated TSH, normal free T4.  B12 levels are also low.  B12 deficiency - Start supplements  Hypocalcemia/hypokalemia - As needed repletion.   Hypertension  - Continue hydralazine  and ARB - IV as needed   CKD 3A  - Appears close to baseline - Renally-dose medications      DVT prophylaxis: enoxaparin  (LOVENOX ) injection 30 mg Start: 02/09/24 1000    Code Status: Full Code Family Communication:  Called Lupita (no answer); Called Gwenn (no answer) Repeat Lytes and PT/OT. If stable may be able [place] home later.     Subjective:  Patient seen at bedside, tells me her mentation feels back to her baseline.  Examination:  General exam: Appears calm and comfortable  Respiratory system: Clear to auscultation. Respiratory effort normal. Cardiovascular system: S1 & S2 heard, RRR. No JVD, murmurs, rubs, gallops or clicks. No pedal edema. Gastrointestinal system: Abdomen is nondistended, soft and nontender. No organomegaly or masses felt. Normal bowel sounds heard. Central nervous system: Alert and oriented. No focal neurological deficits. Extremities: Symmetric 5 x 5  power. Skin: No rashes, lesions or ulcers Psychiatry: Judgement and insight appear normal. Mood & affect appropriate.

## 2024-02-09 NOTE — Consult Note (Signed)
 [facility]  Audio Consult The patient was not seen due to failure of the hospital video carts and instead the case was discussed with primary provider.  Patient Name:   Karla Richards, Karla Richards Date of Birth:   1946-07-08  Differential Diagnosis (based on reported history and exam):       G93.41 - Encephalopathy Metabolic  Assessment and Plan: 78 yo F who presents with change in speech. Head CT personally reviewed and negative for hemorrhage or acute area of developing infarct on my direct view. CTA head/neck without LVO noted on my personal read (no LVO per verbal report ED physician received as well). With LKN outside 4.5 hour window, not a candidate for thrombolytics. With report of negative LVO on CTA head/neck (also on my read, there is report of moderate right P2 stenosis per radiology), no role for thrombectomy. Next step at this time would be to admit/transfer for MRI brain and metabolic/infectious workup (management of SOB per primary team). Full recommendations as follows:   Recommendations:  -transfer for q4h neurochecks/vitals  -obtain MRI brain w/o contrast (routine)  --if + for acute stroke = will need full stroke workup (including lipid panel, A1c, TTE, antiplatelets/statin, etc)  --allow for permissive HTN up to 220 systolic while MRI brain pending  --if MRI negative for stroke = goal of normotension per primary team   -cont on asa 81 mg po qdaily for now  -obtain B12, folate, TSH, ammonia  -obtain UA, CXR -obtain UDS, Etoh Level  -obtain CMP, CBC if not already done  -dvt ppx per primary team  -PT/OT/ST consults  - inpatient rehab if recommended  -call neurology immediately with significant change in exam  -----------------------------------------------------------  HPI (Provided by ED Team): 78 yo F who presents with change in speech. Per ED physician, patient has been to ED 3x in past week. This evening was in  her normal state of health. She went to bed at 630 pm. Came into his room at 10 pm and was hunched over, trouble breathing and was having trouble speaking (was last speaking normally prior to going to bed at around 630 pm per ED physician who clarified with patient's husband while I was on phone with ED physician). In ED, having trouble naming, talking and following commands; no definitive words spoken. Seems to be having trouble participating in exam for ED physician. No focal weakness or deficits noted otherwise.  Exam findings (Reported by the ED team): Per ED physician, patient with following exam: BP: 177/89, Pulse: 83 No focal weakness, difficult to assess for full strength testing, all extremities noted to have antigravity, no clear asymmetry; Responds to noxious stimuli in all four extremities. No definitive words spoken, well make an uh sound to questions. Not following commands. No facial droop, no eye movement abnormalities noted.  Test/Imaging results (Reported by the ED team or reviewed in EMR): Head CT personally reviewed and negative for hemorrhage or acute area of developing infarct on my direct view. CTA head/neck without LVO noted on my personal read (no LVO per verbal report ED physician received as well).  Consider thrombolytic if no contraindications identified by ED: No outside window for thrombolytics  CTA / Advanced imaging recommended: Yes If LVO is identified, the ED team will contact NIR (no LVO noted)   Dr Garn[facility]   [facility] For Inpatient follow-up with [facility] physician please call [facility] at [phone]. As we are not an outpatient service for any post hospital discharge needs please contact  the hospital for assistance.  If you have any questions for the [facility] physicians or need to reconsult for clinical or diagnostic changes please contact us  via [facility] at [phone].
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
## 2024-02-09 NOTE — ED Provider Notes (Signed)
 Patient transferred from [facility] with concern for possible TIA.  Plan was to obtain an MRI scan to rule out stroke.  MRI has been obtained and shows evidence of an old thalamic stroke but no acute stroke.  I have independently viewed the images, and agree with the radiologist's interpretation.  I went to discussed the findings with the patient and with her spouse and this is her third episode in about a week.  She had been to the emergency department once and a second time she went to the emergency department but left without being seen.  She gets short of breath and then is unable to speak.  In the other episode in the ED, she seemed to get better following lorazepam .  I have a strong suspicion that this is indeed a panic attack.  However, I would feel that TIA evaluation needs to be completed.  Prior to coming here she had negative CT of head and CTA of head and neck.  Incidental finding of lung nodule which will need follow-up.  I have discussed case with Dr. Charlton of [facility], who agrees to admit the patient, also discussed the case with Dr. Jerrie of neurology service who agrees to see the patient in consultation.  Decision needs to be made whether she should be on low-dose aspirin  since she does have history of GI bleed.  She also would probably benefit from antianxiety medication on an as needed basis.  Results for orders placed or performed during the hospital encounter of 02/08/24  Comprehensive metabolic panel   Collection Time: 02/08/24 11:30 PM  Result Value Ref Range   Sodium 137 135 - 145 mmol/L   Potassium 3.3 (L) 3.5 - 5.1 mmol/L   Chloride 103 98 - 111 mmol/L   CO2 22 22 - 32 mmol/L   Glucose, Bld 89 70 - 99 mg/dL   BUN 16 8 - 23 mg/dL   Creatinine, Ser 8.93 (H) 0.44 - 1.00 mg/dL   Calcium  10.3 8.9 - 10.3 mg/dL   Total Protein 7.5 6.5 - 8.1 g/dL   Albumin 4.4 3.5 - 5.0 g/dL   AST 28 15 - 41 U/L   ALT 14 0 - 44 U/L   Alkaline Phosphatase 65 38 - 126 U/L   Total  Bilirubin 0.8 0.0 - 1.2 mg/dL   GFR, Estimated 53 (L) >60 mL/min   Anion gap 12 5 - 15  CBC with Differential/Platelet   Collection Time: 02/08/24 11:30 PM  Result Value Ref Range   WBC 5.8 4.0 - 10.5 K/uL   RBC 4.50 3.87 - 5.11 MIL/uL   Hemoglobin 13.2 12.0 - 15.0 g/dL   HCT 59.4 63.9 - 53.9 %   MCV 90.0 80.0 - 100.0 fL   MCH 29.3 26.0 - 34.0 pg   MCHC 32.6 30.0 - 36.0 g/dL   RDW 86.0 88.4 - 84.4 %   Platelets 242 150 - 400 K/uL   nRBC 0.0 0.0 - 0.2 %   Neutrophils Relative % 47 %   Neutro Abs 2.7 1.7 - 7.7 K/uL   Lymphocytes Relative 36 %   Lymphs Abs 2.1 0.7 - 4.0 K/uL   Monocytes Relative 15 %   Monocytes Absolute 0.9 0.1 - 1.0 K/uL   Eosinophils Relative 1 %   Eosinophils Absolute 0.1 0.0 - 0.5 K/uL   Basophils Relative 1 %   Basophils Absolute 0.1 0.0 - 0.1 K/uL   Immature Granulocytes 0 %   Abs Immature Granulocytes 0.01 0.00 -  0.07 K/uL  CBG monitoring, ED   Collection Time: 02/08/24 11:47 PM  Result Value Ref Range   Glucose-Capillary 100 (H) 70 - 99 mg/dL  Ethanol   Collection Time: 02/08/24 11:54 PM  Result Value Ref Range   Alcohol, Ethyl (B) <15 <15 mg/dL  Protime-INR   Collection Time: 02/08/24 11:54 PM  Result Value Ref Range   Prothrombin Time 13.9 11.4 - 15.2 seconds   INR 1.0 0.8 - 1.2  APTT   Collection Time: 02/08/24 11:54 PM  Result Value Ref Range   aPTT 34 24 - 36 seconds  I-stat chem 8, ED   Collection Time: 02/09/24 12:08 AM  Result Value Ref Range   Sodium 135 135 - 145 mmol/L   Potassium 3.1 (L) 3.5 - 5.1 mmol/L   Chloride 104 98 - 111 mmol/L   BUN 14 8 - 23 mg/dL   Creatinine, Ser 8.89 (H) 0.44 - 1.00 mg/dL   Glucose, Bld 899 (H) 70 - 99 mg/dL   Calcium , Ion 1.12 (L) 1.15 - 1.40 mmol/L   TCO2 19 (L) 22 - 32 mmol/L   Hemoglobin 12.2 12.0 - 15.0 g/dL   HCT 63.9 63.9 - 53.9 %  Urinalysis, Routine w reflex microscopic -Urine, Clean Catch   Collection Time: 02/09/24 12:18 AM  Result Value Ref Range   Color, Urine YELLOW YELLOW    APPearance CLEAR CLEAR   Specific Gravity, Urine 1.012 1.005 - 1.030   pH 8.0 5.0 - 8.0   Glucose, UA NEGATIVE NEGATIVE mg/dL   Hgb urine dipstick NEGATIVE NEGATIVE   Bilirubin Urine NEGATIVE NEGATIVE   Ketones, ur NEGATIVE NEGATIVE mg/dL   Protein, ur NEGATIVE NEGATIVE mg/dL   Nitrite NEGATIVE NEGATIVE   Leukocytes,Ua NEGATIVE NEGATIVE  Urine rapid drug screen (hosp performed)   Collection Time: 02/09/24 12:18 AM  Result Value Ref Range   Opiates NONE DETECTED NONE DETECTED   Cocaine NONE DETECTED NONE DETECTED   Benzodiazepines NONE DETECTED NONE DETECTED   Amphetamines NONE DETECTED NONE DETECTED   Tetrahydrocannabinol NONE DETECTED NONE DETECTED   Barbiturates NONE DETECTED NONE DETECTED   MR BRAIN WO CONTRAST Result Date: 02/09/2024 CLINICAL DATA:  Initial evaluation for acute neuro deficit, stroke suspected. EXAM: MRI HEAD WITHOUT CONTRAST TECHNIQUE: Multiplanar, multiecho pulse sequences of the brain and surrounding structures were obtained without intravenous contrast. COMPARISON:  CTs from 02/08/2024. FINDINGS: Brain: Cerebral volume within normal limits for age. Patchy T2/FLAIR hyperintensity involving the periventricular deep white matter both cerebral hemispheres, consistent with chronic small vessel ischemic disease, moderate in nature. Remote lacunar infarct present at the left thalamus. No abnormal foci of restricted diffusion to suggest acute or subacute ischemia. Gray-white matter differentiation maintained. No areas of chronic cortical infarction. No acute intracranial hemorrhage. Few scattered chronic micro hemorrhages noted, likely hypertensive in nature. No mass lesion, midline shift or mass effect. No hydrocephalus or extra-axial fluid collection. Pituitary gland within normal limits. Vascular: Major intracranial vascular flow voids are maintained. Skull and upper cervical spine: Craniocervical junction within normal limits. Bone marrow signal intensity normal. No scalp  soft tissue abnormality. Sinuses/Orbits: Prior bilateral ocular lens replacement. Paranasal sinuses are largely clear. Trace bilateral mastoid effusions noted, of doubtful significance. Other: None. IMPRESSION: 1. No acute intracranial abnormality. 2. Moderate chronic microvascular ischemic disease with remote left thalamic lacunar infarct. Electronically Signed   By: Morene Hoard M.D.   On: 02/09/2024 02:59   CT HEAD CODE STROKE WO CONTRAST Result Date: 02/09/2024 CLINICAL DATA:  Initial evaluation for acute  neuro deficit, stroke suspected. No other relevant history provided. EXAM: CT HEAD WITHOUT CONTRAST CT ANGIOGRAPHY HEAD AND NECK TECHNIQUE: Multidetector CT imaging of the head and neck was performed using the standard protocol during bolus administration of intravenous contrast. Multiplanar CT image reconstructions and MIPs were obtained to evaluate the vascular anatomy. Carotid stenosis measurements (when applicable) are obtained utilizing NASCET criteria, using the distal internal carotid diameter as the denominator. RADIATION DOSE REDUCTION: This exam was performed according to the departmental dose-optimization program which includes automated exposure control, adjustment of the mA and/or kV according to patient size and/or use of iterative reconstruction technique. CONTRAST:  75mL OMNIPAQUE  IOHEXOL  350 MG/ML SOLN COMPARISON:  Prior study from 02/01/2024. FINDINGS: CT HEAD FINDINGS Brain: Cerebral volume within normal limits. Chronic microvascular ischemic disease with a few small remote lacunar infarcts about the basal ganglia and thalami. No acute intracranial hemorrhage. No acute large vessel territory infarct. No mass lesion or midline shift. No hydrocephalus or extra-axial fluid collection. Vascular: No abnormal hyperdense vessel. Scattered vascular calcifications noted within the carotid siphons. Skull: Scalp soft tissues within normal limits.  Calvarium intact. Sinuses/Orbits: Globes  orbital soft tissues within normal limits. Scattered mucosal thickening present about the ethmoidal air cells. Paranasal sinuses are otherwise clear. No significant mastoid effusion. Other: None. Review of the MIP images confirms the above findings CTA NECK FINDINGS Aortic arch: Standard branching. Imaged portion shows no evidence of aneurysm or dissection. No significant stenosis of the major arch vessel origins. Right carotid system: No evidence of dissection, stenosis (50% or greater), or occlusion. Left carotid system: No evidence of dissection, stenosis (50% or greater), or occlusion. Vertebral arteries: Codominant. No evidence of dissection, stenosis (50% or greater), Skeleton: No worrisome osseous lesions. Moderately advanced cervical spondylosis. Other neck: No other acute finding. Upper chest: Few small pulmonary nodules present within the left upper lobe, largest of which measures 5 mm (series 15, image 24). No other acute finding. Review of the MIP images confirms the above findings CTA HEAD FINDINGS Anterior circulation: Atheromatous change about the carotid siphons without hemodynamically significant stenosis. A1 segments patent bilaterally. Normal anterior communicating artery complex. Anterior cerebral arteries widely patent. No M1 stenosis or occlusion. Distal MCA branches perfused and symmetric. Posterior circulation: Both V4 segments patent without significant stenosis. Both PICA patent at their origins. Basilar patent without stenosis. Superior cerebral arteries patent bilaterally. Both PCA supplied via the basilar as well as small bilateral posterior arteries. Left PCA patent without stenosis. Focal moderate proximal right P2 stenosis (series 20, image 44). Right PCA otherwise patent. Venous sinuses: Grossly patent allowing for timing the contrast bolus. Anatomic variants: None significant.  No aneurysm. Review of the MIP images confirms the above findings IMPRESSION: CT HEAD: 1. No acute  intracranial abnormality. 2. Aspects is 10. 3. Chronic microvascular ischemic disease with a few small remote lacunar infarcts about the basal ganglia and thalami. CTA HEAD AND NECK: 1. Negative CTA for large vessel occlusion or other emergent finding. 2. Focal moderate proximal right P2 stenosis. 3. Otherwise wide patency of the major arterial vasculature of the head and neck. No other hemodynamically significant or correctable stenosis. 4. Few small pulmonary nodules within the left upper lobe, largest of which measures 5 mm. Per Fleischner Society Guidelines, if patient is low risk for malignancy, no routine follow-up imaging is recommended. If patient is high risk for malignancy, a non-contrast Chest CT at 12 months is optional. If performed and the nodule is stable at 12 months, no further follow-up  is recommended. These guidelines do not apply to immunocompromised patients and patients with cancer. Follow up in patients with significant comorbidities as clinically warranted. For lung cancer screening, adhere to Lung-RADS guidelines. Reference: Radiology. 2017; 284(1):228-43. Results were called by telephone at the time of interpretation on 02/09/2024 at 12:09 am to provider [facility] , who verbally acknowledged these results. Electronically Signed   By: Morene Hoard M.D.   On: 02/09/2024 00:13   CT ANGIO HEAD NECK W WO CM (CODE STROKE) Result Date: 02/09/2024 CLINICAL DATA:  Initial evaluation for acute neuro deficit, stroke suspected. No other relevant history provided. EXAM: CT HEAD WITHOUT CONTRAST CT ANGIOGRAPHY HEAD AND NECK TECHNIQUE: Multidetector CT imaging of the head and neck was performed using the standard protocol during bolus administration of intravenous contrast. Multiplanar CT image reconstructions and MIPs were obtained to evaluate the vascular anatomy. Carotid stenosis measurements (when applicable) are obtained utilizing NASCET criteria, using the distal internal carotid  diameter as the denominator. RADIATION DOSE REDUCTION: This exam was performed according to the departmental dose-optimization program which includes automated exposure control, adjustment of the mA and/or kV according to patient size and/or use of iterative reconstruction technique. CONTRAST:  75mL OMNIPAQUE  IOHEXOL  350 MG/ML SOLN COMPARISON:  Prior study from 02/01/2024. FINDINGS: CT HEAD FINDINGS Brain: Cerebral volume within normal limits. Chronic microvascular ischemic disease with a few small remote lacunar infarcts about the basal ganglia and thalami. No acute intracranial hemorrhage. No acute large vessel territory infarct. No mass lesion or midline shift. No hydrocephalus or extra-axial fluid collection. Vascular: No abnormal hyperdense vessel. Scattered vascular calcifications noted within the carotid siphons. Skull: Scalp soft tissues within normal limits.  Calvarium intact. Sinuses/Orbits: Globes orbital soft tissues within normal limits. Scattered mucosal thickening present about the ethmoidal air cells. Paranasal sinuses are otherwise clear. No significant mastoid effusion. Other: None. Review of the MIP images confirms the above findings CTA NECK FINDINGS Aortic arch: Standard branching. Imaged portion shows no evidence of aneurysm or dissection. No significant stenosis of the major arch vessel origins. Right carotid system: No evidence of dissection, stenosis (50% or greater), or occlusion. Left carotid system: No evidence of dissection, stenosis (50% or greater), or occlusion. Vertebral arteries: Codominant. No evidence of dissection, stenosis (50% or greater), Skeleton: No worrisome osseous lesions. Moderately advanced cervical spondylosis. Other neck: No other acute finding. Upper chest: Few small pulmonary nodules present within the left upper lobe, largest of which measures 5 mm (series 15, image 24). No other acute finding. Review of the MIP images confirms the above findings CTA HEAD FINDINGS  Anterior circulation: Atheromatous change about the carotid siphons without hemodynamically significant stenosis. A1 segments patent bilaterally. Normal anterior communicating artery complex. Anterior cerebral arteries widely patent. No M1 stenosis or occlusion. Distal MCA branches perfused and symmetric. Posterior circulation: Both V4 segments patent without significant stenosis. Both PICA patent at their origins. Basilar patent without stenosis. Superior cerebral arteries patent bilaterally. Both PCA supplied via the basilar as well as small bilateral posterior arteries. Left PCA patent without stenosis. Focal moderate proximal right P2 stenosis (series 20, image 44). Right PCA otherwise patent. Venous sinuses: Grossly patent allowing for timing the contrast bolus. Anatomic variants: None significant.  No aneurysm. Review of the MIP images confirms the above findings IMPRESSION: CT HEAD: 1. No acute intracranial abnormality. 2. Aspects is 10. 3. Chronic microvascular ischemic disease with a few small remote lacunar infarcts about the basal ganglia and thalami. CTA HEAD AND NECK: 1. Negative CTA for large  vessel occlusion or other emergent finding. 2. Focal moderate proximal right P2 stenosis. 3. Otherwise wide patency of the major arterial vasculature of the head and neck. No other hemodynamically significant or correctable stenosis. 4. Few small pulmonary nodules within the left upper lobe, largest of which measures 5 mm. Per Fleischner Society Guidelines, if patient is low risk for malignancy, no routine follow-up imaging is recommended. If patient is high risk for malignancy, a non-contrast Chest CT at 12 months is optional. If performed and the nodule is stable at 12 months, no further follow-up is recommended. These guidelines do not apply to immunocompromised patients and patients with cancer. Follow up in patients with significant comorbidities as clinically warranted. For lung cancer screening, adhere to  Lung-RADS guidelines. Reference: Radiology. 2017; 284(1):228-43. Results were called by telephone at the time of interpretation on 02/09/2024 at 12:09 am to provider [facility] , who verbally acknowledged these results. Electronically Signed   By: Morene Hoard M.D.   On: 02/09/2024 00:13   DG Chest 2 View Result Date: 02/08/2024 CLINICAL DATA:  sob EXAM: CHEST - 2 VIEW COMPARISON:  Chest x-ray 02/01/2024, CT angio chest 02/01/2024 FINDINGS: The heart and mediastinal contours are unchanged. Atherosclerotic plaque. No focal consolidation. Chronic coarsened interstitial markings with no overt pulmonary edema. No pleural effusion. No pneumothorax. No acute osseous abnormality. IMPRESSION: 1. No active cardiopulmonary disease. 2. Aortic Atherosclerosis (ICD10-I70.0) and Emphysema (ICD10-J43.9). Electronically Signed   By: Morgane  Naveau M.D.   On: 02/08/2024 22:55   CT Angio Chest PE W and/or Wo Contrast Result Date: 02/01/2024 CLINICAL DATA:  78 year old female with altered mental status, shortness of breath. EXAM: CT ANGIOGRAPHY CHEST WITH CONTRAST TECHNIQUE: Multidetector CT imaging of the chest was performed using the standard protocol during bolus administration of intravenous contrast. Multiplanar CT image reconstructions and MIPs were obtained to evaluate the vascular anatomy. RADIATION DOSE REDUCTION: This exam was performed according to the departmental dose-optimization program which includes automated exposure control, adjustment of the mA and/or kV according to patient size and/or use of iterative reconstruction technique. CONTRAST:  60mL OMNIPAQUE  IOHEXOL  350 MG/ML SOLN COMPARISON:  Chest radiographs 0156 hours today. FINDINGS: Cardiovascular: Excellent contrast bolus timing in the pulmonary arterial tree. No pulmonary artery filling defect. Calcified coronary artery atherosclerosis (series 11, image 172). Little contrast in the aorta. Heart size within normal limits. No pericardial  effusion. Mediastinum/Nodes: Negative for mediastinal mass or lymphadenopathy. Lungs/Pleura: Central airways are patent. Low normal lung volumes. Mild and fairly symmetric costophrenic angle curvilinear opacity most resembles atelectasis or scarring. No pleural effusion, confluent or suspicious lung opacity. Upper Abdomen: Negative early arterial enhanced appearance of the visible liver, gallbladder, spleen, adrenal glands and kidneys. Pancreas and proximal small bowel appear indistinct, which might be artifact from early contrast timing. No pneumoperitoneum or free fluid in the visible upper abdomen. Musculoskeletal: Osteopenia. Advanced thoracic disc and endplate degeneration. Widespread thoracic endplate osteophytes but no convincing vertebral ankylosis. No acute or suspicious osseous lesion identified. Review of the MIP images confirms the above findings. IMPRESSION: 1. Negative for acute pulmonary embolus. 2. No acute finding identified in the chest. 3. Calcified coronary artery, Aortic Atherosclerosis (ICD10-I70.0). Electronically Signed   By: VEAR Hurst M.D.   On: 02/01/2024 04:34   CT Head Wo Contrast Result Date: 02/01/2024 CLINICAL DATA:  78 year old female with altered mental status, shortness of breath. EXAM: CT HEAD WITHOUT CONTRAST TECHNIQUE: Contiguous axial images were obtained from the base of the skull through the vertex without intravenous contrast. RADIATION  DOSE REDUCTION: This exam was performed according to the departmental dose-optimization program which includes automated exposure control, adjustment of the mA and/or kV according to patient size and/or use of iterative reconstruction technique. COMPARISON:  Brain MRI 03/01/2021.  Head CT 04/15/2023. FINDINGS: Brain: Cerebral volume remains normal for age. No midline shift, ventriculomegaly, mass effect, evidence of mass lesion, intracranial hemorrhage or evidence of cortically based acute infarction. Moderate patchy, asymmetric cerebral  white matter hypodensity including deep white matter capsule involvement in the left hemisphere. Chronic left thalamic lacunar infarct. Stable gray-white matter differentiation throughout the brain. No cortical encephalomalacia identified. Vascular: No suspicious intracranial vascular hyperdensity. Calcified atherosclerosis at the skull base. Skull: Stable and intact. Sinuses/Orbits: Visualized paranasal sinuses and mastoids are stable and well aerated. Other: No acute orbit or scalp soft tissue finding. IMPRESSION: 1. No acute intracranial abnormality. 2. Stable non contrast CT appearance of moderate for age chronic small vessel disease. Electronically Signed   By: VEAR Hurst M.D.   On: 02/01/2024 04:27   DG Chest 2 View Result Date: 02/01/2024 EXAM: 2 VIEW(S) XRAY OF THE CHEST 02/01/2024 02:00:00 AM COMPARISON: 01/06/2024 CLINICAL HISTORY: sob. Shortness of breath FINDINGS: LUNGS AND PLEURA: No focal pulmonary opacity. No pulmonary edema. No pleural effusion. No pneumothorax. HEART AND MEDIASTINUM: No acute abnormality of the cardiac and mediastinal silhouettes. BONES AND SOFT TISSUES: Mild degenerative changes of the visualized thoracolumbar spine. No acute osseous abnormality. IMPRESSION: 1. No acute process. Electronically signed by: Pinkie Pebbles MD 02/01/2024 02:03 AM EDT RP Workstation: [id number]      Raford Lenis, MD 02/09/24 [phone]

## 2024-02-09 NOTE — ED Notes (Signed)
 Neuro at bedside.

## 2024-02-09 NOTE — Telephone Encounter (Signed)
 Pharmacy Patient Advocate Encounter   Received notification from Patient Pharmacy that prior authorization for Tymlos is required/requested.   Insurance verification completed.   The patient is insured through [employer] .   Per test claim: PA required; PA submitted to above mentioned insurance via CoverMyMeds Key/confirmation #/EOC [id number] Status is pending

## 2024-02-09 NOTE — ED Notes (Signed)
 CCMD [facility]) contacted to admit the patient to cardiac monitoring.

## 2024-02-09 NOTE — Consult Note (Addendum)
 NEUROLOGY CONSULT NOTE   Date of service: February 09, 2024 Patient Name: Karla Richards MRN:  [id number] DOB:  [birth year] Chief Complaint: shortness of breath Requesting Provider: Raford Lenis, MD  History of Present Illness  Karla Richards is a 78 y.o. female with hx of panic attacks, hypertension, osteoporosis, hypothyroidism, glaucoma, macular degeneration, multiple listed allergies including food allergies, degenerative disc disease with moderate C-spine stenosis  She has experienced three episodes of severe distress and hyperventilation in the past week, leading to emergency room visits. During these episodes, she feels severely distressed, hyperventilates, and experiences shaking throughout her body. She describes these episodes as 'unbearable' and notes that they are accompanied by moaning and an inability to communicate effectively. She recalls being unable to answer questions or follow commands during these episodes, such as stating the year or her last name, and she mistakenly raised the wrong leg when asked. Her husband reports that during the first episode, she was unable to walk to the car and required EMS transport. In subsequent episodes, she was able to walk but still felt the need to visit the emergency room. During the most recent episode, she was shaking and moaning in the car and was unresponsive to her husband's attempts to communicate. She has been given Ativan  during these episodes, with marked improvement in her symptoms.  As there was an attempt to activate code stroke for speech difficulty, neurology is asked to formally consult regarding whether this could be a TIA  She has a history of panic attacks, which she experienced many years ago, but she does not recognize these current episodes as similar. She has been told by multiple providers that these episodes may be panic attacks, but she is unsure what she might be panicking over. She has a history of  anxiety and has experienced panic attacks in the past, particularly during stressful periods in her life (last episodes were approximately 30 years ago when she was in the middle of a stressful divorce).  Her sister has contacted her husband with concerns that the patient needs to be in therapy for these episodes  She reports significant vision changes, including severe vision changes due to glaucoma and advanced macular degeneration. She describes her vision as not coordinating well between her eyes, leading to diplopia, which she experiences as two images side by side. She is scheduled to see her ophthalmologist, Dr. Dennise, in the coming months.  She has a history of a suspected stroke two years ago, which was later clarified by a neurologist at [facility] as not being a stroke. This was apparently triggered by episode at a baseball game where she felt faint and was cooled down but did not seek immediate medical attention. She later saw her family doctor, who initially suggested she had a stroke, leading to therapy for balance and speech issues.  On review of her neurology note from 05/25/2021 she had been evaluated for hyponatremia and weakness, and there was a possible concern for stroke on MRI but repeat MRI was negative and the initial finding was felt to be artifactual.  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  Headaches and nausea improved with improved sleep hygiene and increased fluid intake.  She has experienced balance issues, which she associates with her current  condition. She reports feeling unsteady and moving slowly, which has affected her confidence in driving. She has a treadmill at home and has been able to walk on it for extended periods when not experiencing these episodes.  Her current medications include treatments for high blood pressure, vitamin D  supplementation, and thyroid  medication. She has used Benadryl  in the past for allergies, particularly when she feels her throat may be closing due to an allergic reaction.   No loss of consciousness, tongue biting, incontinence, or waking up her spouse with shaking.  No history of meningitis/encephalitis  No history of significant head trauma  No family history of seizures No family history of seizures   She denies any recent falls.    ROS  Comprehensive ROS performed and pertinent positives documented in HPI    Past History   Past Medical History:  Diagnosis Date   Arthritis    hands, but reports that she is still active    Cancer (HCC) 2019   skin cancer   Complication of anesthesia    states 44 yrs. ago she was given Seconal for childbirth  & she was passed out & felt like she was on fire   Coronary artery disease    Genital herpes    Glaucoma    bilateral    Hearing difficulty    bilateral, Left worse than R , no aids yet    Hypertension    Osteoporosis    Sleep apnea    tested negative but wakes up gasping for air in her sleep; does not qualify for CPAP    Past Surgical History:  Procedure Laterality Date   CESAREAN SECTION  1970, '72, '79   coronary catherization     MOUTH SURGERY     TRABECULECTOMY Left 03/26/2015   Procedure: TRABECULECTOMY WITH [facility] ON THE LEFT EYE;  Surgeon: Gaither Quan, MD;  Location: [facility] OR;  Service: Ophthalmology;  Laterality: Left;   TUBAL LIGATION      Family History: Family History  Problem Relation Age of Onset   Heart attack Mother 44   Stomach cancer Sister    Leukemia Sister    Stroke Maternal Grandmother 40   Colon cancer  Maternal Uncle    Rectal cancer Maternal Uncle    Esophageal cancer Neg Hx     Social History  reports that she has never smoked. She has never used smokeless tobacco. She reports that she does not drink alcohol and does not use drugs.  Allergies  Allergen Reactions   Fish Allergy Anaphylaxis   Justicia Adhatoda Anaphylaxis   Mango Flavoring Agent (Non-Screening) Anaphylaxis   Other Anaphylaxis    Tree nuts   Peanuts [Peanut Oil] Anaphylaxis   Penicillins Anaphylaxis    Did it involve swelling of the face/tongue/throat, SOB, or low BP? Yes Did it involve sudden or severe rash/hives, skin peeling, or any reaction on the inside of your mouth or nose? No Did you need to seek medical attention at a hospital or doctor's office? No When did it last happen?    Several Years Ago   If all above answers are NO, may proceed with cephalosporin use.     Shellfish Allergy Swelling   Ciprofloxacin Hcl Other (See Comments)    Other reaction(s): Other Pt can take pill but no the drops  Pt can take pill but no the drops    Coconut Fatty Acid     Other reaction(s): Other, Unknown   Fluorescein -Benoxinate     Other reaction(s): Other Eye irritation (lid swelling, itching)   Seconal [Secobarbital Sodium] Other (See Comments)    Pt. felt like she was on fire, states her Mom told she passed out   Turmeric    Ace Inhibitors Hives   Alcohol-Sulfur [Elemental Sulfur] Other (See Comments)    Sleepy   Aspirin  Other (See Comments)    Abdominal irritation   Beta Adrenergic Blockers Hives   Brimonidine  Itching and Other (See Comments)    Puffy eyes - Alphagan     Caffeine Other (See Comments)    Jittery, feels nervous   Iodine Solution [Povidone Iodine] Itching and Other (See Comments)    Topical solution caused severe itching   Latex Itching   Milk-Related Compounds Other (See Comments)    Fluid in the ear and dizziness    Motrin [Ibuprofen] Other (See Comments)    Upset stomach   Norvasc  [Amlodipine] Hives and Swelling   Sulfa Antibiotics Other (See Comments)    Can take with Benadryl     Medications  No current facility-administered medications for this encounter.  Current Outpatient Medications:    Abaloparatide (TYMLOS) 3120 MCG/1.56ML SOPN, Inject 80 mcg into the skin daily., Disp: 1.56 mL, Rfl: 12   b complex vitamins capsule, Take 1 capsule by mouth daily., Disp: , Rfl:    Calcium  Carbonate-Vit D-Min (CALCIUM  600+D PLUS MINERALS) 600-400 MG-UNIT TABS, Take 1 tablet by mouth in the morning and at bedtime., Disp: 60 tablet, Rfl: 5   cetirizine  (ZYRTEC ) 5 MG tablet, Take 1 tablet (5 mg total) by mouth daily., Disp: 30 tablet, Rfl: 2   diphenhydrAMINE  (BENADRYL ) 25 MG tablet, Take 50 mg by mouth 2 (two) times daily as needed for allergies or itching., Disp: , Rfl:    famotidine  (PEPCID ) 10 MG tablet, Take 10 mg by mouth 2 (two) times daily as needed for heartburn or indigestion., Disp: , Rfl:    fluoruracil (CARAC ) 0.5 % cream, Apply 1 application topically daily as needed (skin cancer flare up)., Disp: , Rfl:    hydrALAZINE  (APRESOLINE ) 50 MG tablet, Take 50 mg by mouth daily., Disp: , Rfl:    Insulin Pen Needle (ASSURE ID DUO PRO PEN NEEDLES) 31G X 5 MM MISC, 1 each by Does not apply route daily., Disp: 30 each, Rfl: 24   isosorbide  mononitrate (IMDUR ) 60 MG 24 hr tablet, Take 1 tablet (60 mg total) by mouth daily., Disp: 30 tablet, Rfl: 3   Multiple Vitamins-Minerals (MULTIVITAMIN ADULTS PO), Take 1 tablet by mouth daily., Disp: , Rfl:    olopatadine  (PATANOL) 0.1 % ophthalmic solution, Place 1 drop into both eyes 2 (two) times daily., Disp: 5 mL, Rfl: 2   pantoprazole  (PROTONIX ) 40 MG tablet, Take 1 tablet (40 mg total) by mouth daily., Disp: 30 tablet, Rfl: 1   tretinoin  (RETIN-A ) 0.05 % cream, Apply 1 application topically at bedtime., Disp: , Rfl:    valsartan (DIOVAN) 80 MG tablet, Take 160 mg by mouth daily., Disp: , Rfl:    Vitamin D , Ergocalciferol , (DRISDOL ) 1.25  MG (50000 UNIT) CAPS capsule, Take 1 capsule (50,000 Units total) by mouth every 7 (seven) days., Disp: 6 capsule, Rfl: 0   ZIOPTAN  0.0015 % SOLN, Place 1 drop into the left eye at bedtime., Disp: , Rfl:   Facility-Administered Medications Ordered in Other Encounters:  mitoMYcin  (MUTAMYCIN ) Injection Use in OR only (0.4 mg/ml), 0.5 mL, Left Eye, Once, Cyrus Carwin, MD  Vitals   Vitals:   02/09/24 0015 02/09/24 0131 02/09/24 0145 02/09/24 0200  BP: (!) 172/78 (!) 166/77 (!) 151/71 (!) 145/68  Pulse: 84 77 65 (!) 58  Resp: (!) 27 14 17 12   Temp:      TempSrc:      SpO2: 95% 100% 100% 100%    There is no height or weight on file to calculate BMI.   Physical Exam   Constitutional: Appears well-developed and well-nourished.  Psych: Affect pleasant and cooperative, calm at the time of my evaluation, however highly tangential Eyes: No scleral injection HENT: No oropharyngeal obstruction.  MSK: no major joint deformities, mild to moderate kyphosis.  Cardiovascular: Normal rate and regular rhythm.  Perfusing extremities well Respiratory: Effort normal, non-labored breathing GI: Soft.  No distension. There is no tenderness.  Skin: Warm dry and intact visible skin  Neurologic Examination   Mental Status: Patient repeatedly falling asleep during examination (at 5 AM), but otherwise oriented to person, place, month, year, and situation. Patient is able to give a reasonable history however poor attention/concentration due to fatigue leads to multiple tangential answers No signs of aphasia or neglect Cranial Nerves: II: Visual Fields are full.  Left pupil is postsurgical and only slightly reactive 5 mm to 4 mm, right pupil is round and reactive 4 mm to 2 mm III,IV, VI: EOMI without ptosis or diploplia.  V: Facial sensation is symmetric to light touch VII: Facial movement is symmetric.  VIII: hearing is intact to voice X: Uvula elevates symmetrically XI: Shoulder shrug is  symmetric. XII: tongue is midline without atrophy or fasciculations.  Motor: Tone is normal. Bulk is normal. 5/5 strength was present in all four extremities.  Sensory: Sensation is symmetric to light touch and temperature in the arms and legs. Deep Tendon Reflexes: 3+ in the left brachioradialis and patella, 4+ in the right brachioradialis and patella with 2-3 beats of clonus Plantars: Toes are downgoing bilaterally.  Cerebellar: FNF and HKS are intact bilaterally Gait:  Deferred due to no appropriate nonslip socks available at the time of my evaluation, however patient and family reported she ambulated normally to the bathroom shortly before my evaluation   Labs/Imaging/Neurodiagnostic studies   CBC:  Recent Labs  Lab Feb 21, 2024 2330 02/09/24 0008  WBC 5.8  --   NEUTROABS 2.7  --   HGB 13.2 12.2  HCT 40.5 36.0  MCV 90.0  --   PLT 242  --    Basic Metabolic Panel:  Lab Results  Component Value Date   NA 135 02/09/2024   K 3.1 (L) 02/09/2024   CO2 22 02-21-24   GLUCOSE 100 (H) 02/09/2024   BUN 14 02/09/2024   CREATININE 1.10 (H) 02/09/2024   CALCIUM  10.3 02-21-24   GFRNONAA 53 (L) 2024/02/21   GFRAA 55 (L) 05/02/2019   Lipid Panel:  Lab Results  Component Value Date   LDLCALC 105 (H) 03/01/2021   HgbA1c:  Lab Results  Component Value Date   HGBA1C 5.4 03/01/2021   Urine Drug Screen:     Component Value Date/Time   LABOPIA NONE DETECTED 02/09/2024 0018   COCAINSCRNUR NONE DETECTED 02/09/2024 0018   LABBENZ NONE DETECTED 02/09/2024 0018   AMPHETMU NONE DETECTED 02/09/2024 0018   THCU NONE DETECTED 02/09/2024 0018   LABBARB NONE DETECTED 02/09/2024 0018    Alcohol Level     Component Value Date/Time  ETH <15 02/08/2024 2354   INR  Lab Results  Component Value Date   INR 1.0 02/08/2024   APTT  Lab Results  Component Value Date   APTT 34 02/08/2024   AED levels: No results found for: PHENYTOIN, ZONISAMIDE, LAMOTRIGINE,  LEVETIRACETA  CT Head without contrast(Personally reviewed): No acute intracranial process  CT angio Head and Neck with contrast(Personally reviewed):  1. Negative CTA for large vessel occlusion or other emergent finding. 2. Focal moderate proximal right P2 stenosis. 3. Otherwise wide patency of the major arterial vasculature of the head and neck. No other hemodynamically significant or correctable stenosis. 4. Few small pulmonary nodules within the left upper lobe, largest of which measures 5 mm. Per Fleischner Society Guidelines, if patient is low risk for malignancy, no routine follow-up imaging is recommended. If patient is high risk for malignancy, a non-contrast Chest CT at 12 months is optional. If performed and the nodule is stable at 12 months, no further follow-up is recommended. These guidelines do not apply to immunocompromised patients and patients with cancer. Follow up in patients with significant comorbidities as clinically warranted. For lung cancer screening, adhere to Lung-RADS guidelines. Reference: Radiology. 2017; 284(1):228-43.    CT angio PE protocol 02/01/2024 1. Negative for acute pulmonary embolus. 2. No acute finding identified in the chest. 3. Calcified coronary artery, Aortic Atherosclerosis (ICD10-I70.0).  MRI Brain(Personally reviewed): 1. No acute intracranial abnormality. 2. Moderate chronic microvascular ischemic disease with remote left thalamic lacunar infarct.   ASSESSMENT   Karla Richards is a 78 y.o. woman with a past medical history significant for panic attacks presenting with symptoms most consistent with panic attacks although she subjectively feels these are different than her prior panic attacks 30 years ago in ways that she is not able to fully describe.  It is hard to assess whether the tangential answers she is giving to me and a slight inability to fully grasp the information I am providing is secondary to fatigue/sleep  disruption given time of day versus cognitive impairment.  When she is better rested I think it would be useful to clarify with her the trigger for her events, which may best be done by a therapist/psychiatrist.   RECOMMENDATIONS  -Agree with reversible causes of dementia workup, ammonia, thiamine, B12, MMA, folate, TSH as well as free T4 given recent elevated TSH in April 2025 -Outpatient follow-up with psychiatry -Outpatient neurocognitive evaluation -Discussed conservative measures for hyperventilation including breathing into a paper bag -Pharmacological interventions for significant anxiety per primary team/outpatient providers -Inpatient neurology will sign off at this time, please reach out with any additional questions or concerns ______________________________________________________________________   Lola Jernigan MD-PhD [facility] [phone] Available 7 AM to 7 PM, outside these hours please contact Neurologist on call listed on AMION   Greater than 80 minutes spent in care of this patient, majority in direct patient contact at bedside

## 2024-02-09 NOTE — Progress Notes (Signed)
 PROGRESS NOTE    Karla Richards  FMW:[id number] DOB: October 18, 1945 DOA: 02/08/2024 PCP: Benjamine Aland, MD    Brief Narrative:  78 year old with history of HTN, CKD 3A, osteoporosis, extensive allergy list admitted for acute shortness of breath and speech difficulty.  Apparently patient was having difficulty explaining her symptoms.  Initially short of breath but it resolved in the ER.  CT head, chest x-ray were negative.  Teleneurology recommended transferring patient from [facility] to [facility].  MRI brain is also unremarkable. During the hospitalization she also required electrolyte repletion and was started on B12 supplements due to B12 deficiency.  Assessment & Plan:  Principal Problem:   Acute encephalopathy Active Problems:   HTN (hypertension)   Stage 3a chronic kidney disease (HCC)   Hypokalemia    Acute encephalopathy Seen by teleneurology.  CT head, MRI brain are negative.  Ammonia, folate are normal.  Elevated TSH, normal free T4.  B12 levels are also low.  B12 deficiency - Start supplements  Hypocalcemia/hypokalemia - As needed repletion.   Hypertension  - Continue hydralazine  and ARB - IV as needed   CKD 3A  - Appears close to baseline - Renally-dose medications      DVT prophylaxis: enoxaparin  (LOVENOX ) injection 30 mg Start: 02/09/24 1000    Code Status: Full Code Family Communication:  Called Lupita (no answer); Called Gwenn (no answer) Repeat Lytes and PT/OT. If stable may be able [place] later.     Subjective:  Patient seen at bedside, tells me her mentation feels back to her baseline.  Examination:  General exam: Appears calm and comfortable  Respiratory system: Clear to auscultation. Respiratory effort normal. Cardiovascular system: S1 & S2 heard, RRR. No JVD, murmurs, rubs, gallops or clicks. No pedal edema. Gastrointestinal system: Abdomen is nondistended, soft and nontender. No organomegaly or masses felt. Normal bowel sounds  heard. Central nervous system: Alert and oriented. No focal neurological deficits. Extremities: Symmetric 5 x 5 power. Skin: No rashes, lesions or ulcers Psychiatry: Judgement and insight appear normal. Mood & affect appropriate.                Diet Orders (From admission, onward)     Start     Ordered   02/09/24 0542  Diet regular Room service appropriate? Yes; Fluid consistency: Thin  Diet effective now       Question Answer Comment  Room service appropriate? Yes   Fluid consistency: Thin      02/09/24 0542            Objective: Vitals:   02/09/24 1135 02/09/24 1140 02/09/24 1145 02/09/24 1212  BP: (!) 127/100  (!) 137/107 (!) 152/74  Pulse: 71  65   Resp:  20 17   Temp:      TempSrc:      SpO2: 100%  100%    No intake or output data in the 24 hours ending 02/09/24 1230 There were no vitals filed for this visit.  Scheduled Meds:  vitamin B-12  1,000 mcg Oral Daily   enoxaparin  (LOVENOX ) injection  30 mg Subcutaneous Q24H   hydrALAZINE   50 mg Oral Q6H   irbesartan   150 mg Oral Daily   sodium chloride  flush  3 mL Intravenous Q12H   Continuous Infusions:  Nutritional status     There is no height or weight on file to calculate BMI.  Data Reviewed:   CBC: Recent Labs  Lab 02/08/24 2330 02/09/24 0008  WBC 5.8  --  NEUTROABS 2.7  --   HGB 13.2 12.2  HCT 40.5 36.0  MCV 90.0  --   PLT 242  --    Basic Metabolic Panel: Recent Labs  Lab 02/08/24 2330 02/09/24 0008  NA 137 135  K 3.3* 3.1*  CL 103 104  CO2 22  --   GLUCOSE 89 100*  BUN 16 14  CREATININE 1.06* 1.10*  CALCIUM  10.3  --    GFR: Estimated Creatinine Clearance: 27.6 mL/min (A) (by C-G formula based on SCr of 1.1 mg/dL (H)). Liver Function Tests: Recent Labs  Lab 02/08/24 2330  AST 28  ALT 14  ALKPHOS 65  BILITOT 0.8  PROT 7.5  ALBUMIN 4.4   No results for input(s): LIPASE, AMYLASE in the last 168 hours. Recent Labs  Lab 02/09/24 0600  AMMONIA <13    Coagulation Profile: Recent Labs  Lab 02/08/24 2354  INR 1.0   Cardiac Enzymes: No results for input(s): CKTOTAL, CKMB, CKMBINDEX, TROPONINI in the last 168 hours. BNP (last 3 results) No results for input(s): PROBNP in the last 8760 hours. HbA1C: No results for input(s): HGBA1C in the last 72 hours. CBG: Recent Labs  Lab 02/08/24 2347  GLUCAP 100*   Lipid Profile: No results for input(s): CHOL, HDL, LDLCALC, TRIG, CHOLHDL, LDLDIRECT in the last 72 hours. Thyroid  Function Tests: Recent Labs    02/09/24 0600  TSH 9.839*  FREET4 1.00   Anemia Panel: Recent Labs    02/09/24 0600  VITAMINB12 88*  FOLATE 29.6   Sepsis Labs: No results for input(s): PROCALCITON, LATICACIDVEN in the last 168 hours.  No results found for this or any previous visit (from the past 240 hours).       Radiology Studies: MR BRAIN WO CONTRAST Result Date: 02/09/2024 CLINICAL DATA:  Initial evaluation for acute neuro deficit, stroke suspected. EXAM: MRI HEAD WITHOUT CONTRAST TECHNIQUE: Multiplanar, multiecho pulse sequences of the brain and surrounding structures were obtained without intravenous contrast. COMPARISON:  CTs from 02/08/2024. FINDINGS: Brain: Cerebral volume within normal limits for age. Patchy T2/FLAIR hyperintensity involving the periventricular deep white matter both cerebral hemispheres, consistent with chronic small vessel ischemic disease, moderate in nature. Remote lacunar infarct present at the left thalamus. No abnormal foci of restricted diffusion to suggest acute or subacute ischemia. Gray-white matter differentiation maintained. No areas of chronic cortical infarction. No acute intracranial hemorrhage. Few scattered chronic micro hemorrhages noted, likely hypertensive in nature. No mass lesion, midline shift or mass effect. No hydrocephalus or extra-axial fluid collection. Pituitary gland within normal limits. Vascular: Major intracranial vascular  flow voids are maintained. Skull and upper cervical spine: Craniocervical junction within normal limits. Bone marrow signal intensity normal. No scalp soft tissue abnormality. Sinuses/Orbits: Prior bilateral ocular lens replacement. Paranasal sinuses are largely clear. Trace bilateral mastoid effusions noted, of doubtful significance. Other: None. IMPRESSION: 1. No acute intracranial abnormality. 2. Moderate chronic microvascular ischemic disease with remote left thalamic lacunar infarct. Electronically Signed   By: Morene Hoard M.D.   On: 02/09/2024 02:59   CT HEAD CODE STROKE WO CONTRAST Result Date: 02/09/2024 CLINICAL DATA:  Initial evaluation for acute neuro deficit, stroke suspected. No other relevant history provided. EXAM: CT HEAD WITHOUT CONTRAST CT ANGIOGRAPHY HEAD AND NECK TECHNIQUE: Multidetector CT imaging of the head and neck was performed using the standard protocol during bolus administration of intravenous contrast. Multiplanar CT image reconstructions and MIPs were obtained to evaluate the vascular anatomy. Carotid stenosis measurements (when applicable) are obtained utilizing NASCET criteria, using  the distal internal carotid diameter as the denominator. RADIATION DOSE REDUCTION: This exam was performed according to the departmental dose-optimization program which includes automated exposure control, adjustment of the mA and/or kV according to patient size and/or use of iterative reconstruction technique. CONTRAST:  75mL OMNIPAQUE  IOHEXOL  350 MG/ML SOLN COMPARISON:  Prior study from 02/01/2024. FINDINGS: CT HEAD FINDINGS Brain: Cerebral volume within normal limits. Chronic microvascular ischemic disease with a few small remote lacunar infarcts about the basal ganglia and thalami. No acute intracranial hemorrhage. No acute large vessel territory infarct. No mass lesion or midline shift. No hydrocephalus or extra-axial fluid collection. Vascular: No abnormal hyperdense vessel. Scattered  vascular calcifications noted within the carotid siphons. Skull: Scalp soft tissues within normal limits.  Calvarium intact. Sinuses/Orbits: Globes orbital soft tissues within normal limits. Scattered mucosal thickening present about the ethmoidal air cells. Paranasal sinuses are otherwise clear. No significant mastoid effusion. Other: None. Review of the MIP images confirms the above findings CTA NECK FINDINGS Aortic arch: Standard branching. Imaged portion shows no evidence of aneurysm or dissection. No significant stenosis of the major arch vessel origins. Right carotid system: No evidence of dissection, stenosis (50% or greater), or occlusion. Left carotid system: No evidence of dissection, stenosis (50% or greater), or occlusion. Vertebral arteries: Codominant. No evidence of dissection, stenosis (50% or greater), Skeleton: No worrisome osseous lesions. Moderately advanced cervical spondylosis. Other neck: No other acute finding. Upper chest: Few small pulmonary nodules present within the left upper lobe, largest of which measures 5 mm (series 15, image 24). No other acute finding. Review of the MIP images confirms the above findings CTA HEAD FINDINGS Anterior circulation: Atheromatous change about the carotid siphons without hemodynamically significant stenosis. A1 segments patent bilaterally. Normal anterior communicating artery complex. Anterior cerebral arteries widely patent. No M1 stenosis or occlusion. Distal MCA branches perfused and symmetric. Posterior circulation: Both V4 segments patent without significant stenosis. Both PICA patent at their origins. Basilar patent without stenosis. Superior cerebral arteries patent bilaterally. Both PCA supplied via the basilar as well as small bilateral posterior arteries. Left PCA patent without stenosis. Focal moderate proximal right P2 stenosis (series 20, image 44). Right PCA otherwise patent. Venous sinuses: Grossly patent allowing for timing the contrast  bolus. Anatomic variants: None significant.  No aneurysm. Review of the MIP images confirms the above findings IMPRESSION: CT HEAD: 1. No acute intracranial abnormality. 2. Aspects is 10. 3. Chronic microvascular ischemic disease with a few small remote lacunar infarcts about the basal ganglia and thalami. CTA HEAD AND NECK: 1. Negative CTA for large vessel occlusion or other emergent finding. 2. Focal moderate proximal right P2 stenosis. 3. Otherwise wide patency of the major arterial vasculature of the head and neck. No other hemodynamically significant or correctable stenosis. 4. Few small pulmonary nodules within the left upper lobe, largest of which measures 5 mm. Per Fleischner Society Guidelines, if patient is low risk for malignancy, no routine follow-up imaging is recommended. If patient is high risk for malignancy, a non-contrast Chest CT at 12 months is optional. If performed and the nodule is stable at 12 months, no further follow-up is recommended. These guidelines do not apply to immunocompromised patients and patients with cancer. Follow up in patients with significant comorbidities as clinically warranted. For lung cancer screening, adhere to Lung-RADS guidelines. Reference: Radiology. 2017; 284(1):228-43. Results were called by telephone at the time of interpretation on 02/09/2024 at 12:09 am to provider [facility] , who verbally acknowledged these results. Electronically Signed  By: Morene Hoard M.D.   On: 02/09/2024 00:13   CT ANGIO HEAD NECK W WO CM (CODE STROKE) Result Date: 02/09/2024 CLINICAL DATA:  Initial evaluation for acute neuro deficit, stroke suspected. No other relevant history provided. EXAM: CT HEAD WITHOUT CONTRAST CT ANGIOGRAPHY HEAD AND NECK TECHNIQUE: Multidetector CT imaging of the head and neck was performed using the standard protocol during bolus administration of intravenous contrast. Multiplanar CT image reconstructions and MIPs were obtained to evaluate  the vascular anatomy. Carotid stenosis measurements (when applicable) are obtained utilizing NASCET criteria, using the distal internal carotid diameter as the denominator. RADIATION DOSE REDUCTION: This exam was performed according to the departmental dose-optimization program which includes automated exposure control, adjustment of the mA and/or kV according to patient size and/or use of iterative reconstruction technique. CONTRAST:  75mL OMNIPAQUE  IOHEXOL  350 MG/ML SOLN COMPARISON:  Prior study from 02/01/2024. FINDINGS: CT HEAD FINDINGS Brain: Cerebral volume within normal limits. Chronic microvascular ischemic disease with a few small remote lacunar infarcts about the basal ganglia and thalami. No acute intracranial hemorrhage. No acute large vessel territory infarct. No mass lesion or midline shift. No hydrocephalus or extra-axial fluid collection. Vascular: No abnormal hyperdense vessel. Scattered vascular calcifications noted within the carotid siphons. Skull: Scalp soft tissues within normal limits.  Calvarium intact. Sinuses/Orbits: Globes orbital soft tissues within normal limits. Scattered mucosal thickening present about the ethmoidal air cells. Paranasal sinuses are otherwise clear. No significant mastoid effusion. Other: None. Review of the MIP images confirms the above findings CTA NECK FINDINGS Aortic arch: Standard branching. Imaged portion shows no evidence of aneurysm or dissection. No significant stenosis of the major arch vessel origins. Right carotid system: No evidence of dissection, stenosis (50% or greater), or occlusion. Left carotid system: No evidence of dissection, stenosis (50% or greater), or occlusion. Vertebral arteries: Codominant. No evidence of dissection, stenosis (50% or greater), Skeleton: No worrisome osseous lesions. Moderately advanced cervical spondylosis. Other neck: No other acute finding. Upper chest: Few small pulmonary nodules present within the left upper lobe,  largest of which measures 5 mm (series 15, image 24). No other acute finding. Review of the MIP images confirms the above findings CTA HEAD FINDINGS Anterior circulation: Atheromatous change about the carotid siphons without hemodynamically significant stenosis. A1 segments patent bilaterally. Normal anterior communicating artery complex. Anterior cerebral arteries widely patent. No M1 stenosis or occlusion. Distal MCA branches perfused and symmetric. Posterior circulation: Both V4 segments patent without significant stenosis. Both PICA patent at their origins. Basilar patent without stenosis. Superior cerebral arteries patent bilaterally. Both PCA supplied via the basilar as well as small bilateral posterior arteries. Left PCA patent without stenosis. Focal moderate proximal right P2 stenosis (series 20, image 44). Right PCA otherwise patent. Venous sinuses: Grossly patent allowing for timing the contrast bolus. Anatomic variants: None significant.  No aneurysm. Review of the MIP images confirms the above findings IMPRESSION: CT HEAD: 1. No acute intracranial abnormality. 2. Aspects is 10. 3. Chronic microvascular ischemic disease with a few small remote lacunar infarcts about the basal ganglia and thalami. CTA HEAD AND NECK: 1. Negative CTA for large vessel occlusion or other emergent finding. 2. Focal moderate proximal right P2 stenosis. 3. Otherwise wide patency of the major arterial vasculature of the head and neck. No other hemodynamically significant or correctable stenosis. 4. Few small pulmonary nodules within the left upper lobe, largest of which measures 5 mm. Per Fleischner Society Guidelines, if patient is low risk for malignancy, no routine follow-up imaging  is recommended. If patient is high risk for malignancy, a non-contrast Chest CT at 12 months is optional. If performed and the nodule is stable at 12 months, no further follow-up is recommended. These guidelines do not apply to immunocompromised  patients and patients with cancer. Follow up in patients with significant comorbidities as clinically warranted. For lung cancer screening, adhere to Lung-RADS guidelines. Reference: Radiology. 2017; 284(1):228-43. Results were called by telephone at the time of interpretation on 02/09/2024 at 12:09 am to provider [facility] , who verbally acknowledged these results. Electronically Signed   By: Morene Hoard M.D.   On: 02/09/2024 00:13   DG Chest 2 View Result Date: 02/08/2024 CLINICAL DATA:  sob EXAM: CHEST - 2 VIEW COMPARISON:  Chest x-ray 02/01/2024, CT angio chest 02/01/2024 FINDINGS: The heart and mediastinal contours are unchanged. Atherosclerotic plaque. No focal consolidation. Chronic coarsened interstitial markings with no overt pulmonary edema. No pleural effusion. No pneumothorax. No acute osseous abnormality. IMPRESSION: 1. No active cardiopulmonary disease. 2. Aortic Atherosclerosis (ICD10-I70.0) and Emphysema (ICD10-J43.9). Electronically Signed   By: Morgane  Naveau M.D.   On: 02/08/2024 22:55           LOS: 0 days   Time spent= 35 mins    [place]urgess JAYSON Dare, MD [facility]  If 7PM-7AM, please contact night-coverage  02/09/2024, 12:30 PM

## 2024-02-09 NOTE — ED Notes (Signed)
 CCMD called to admit the patient for cardiac monitoring.

## 2024-02-09 NOTE — ED Notes (Signed)
 Patient back from MRI.

## 2024-02-09 NOTE — ED Triage Notes (Signed)
 Trx from [place] for MRI. Pt has no c/o pain at this time. Bp with ems 168/75 74 hr

## 2024-02-09 NOTE — ED Notes (Signed)
 Called CCMD to transfer pt to room 46
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
## 2024-02-09 NOTE — Evaluation (Signed)
 Occupational Therapy Evaluation Patient Details Name: Karla Richards MRN: [id number] DOB: 09-Apr-1946 Today's Date: 02/09/2024   History of Present Illness   78 yo female presents to [facility] 6/25 for SOB, speech difficulty. Similar ED visits 6/18 and 6/19, first time pt sent home and second time pt left before seeing MD. Workup for acute encephalopathy. PMH includes HTN, CKD IIIA, osteoporosis, CAD, OSA, glaucoma, macular degeneration.     Clinical Impressions PTA, pt lives with spouse and reports typically completely Independent in all ADLs, IADLs, driving and mobility without AD. Pt presents now with deficits in balance and endurance. Pt initially required Mod A to mobilize without AD d/t frequent LOB, improving to intermittent Min A when using RW. Pt requires Setup for UB ADL and Mod A for LB ADLs. Pt declines need for inpatient rehab stay and opting for home. Would recommend HHOT, RW and shower chair to decrease overall fall risk with daily tasks.      If plan is discharge home, recommend the following:   A lot of help with walking and/or transfers;A lot of help with bathing/dressing/bathroom;Assistance with cooking/housework;Help with stairs or ramp for entrance     Functional Status Assessment   Patient has had a recent decline in their functional status and demonstrates the ability to make significant improvements in function in a reasonable and predictable amount of time.     Equipment Recommendations   Other (comment);Tub/shower bench;BSC/3in1 (RW)     Recommendations for Other Services   Rehab consult     Precautions/Restrictions   Precautions Precautions: Fall Restrictions Weight Bearing Restrictions Per Provider Order: No     Mobility Bed Mobility Overal bed mobility: Needs Assistance Bed Mobility: Supine to Sit     Supine to sit: Min assist          Transfers Overall transfer level: Needs assistance Equipment used: None Transfers: Sit  to/from Stand Sit to Stand: Min assist                  Balance Overall balance assessment: Needs assistance Sitting-balance support: No upper extremity supported, Feet supported Sitting balance-Leahy Scale: Fair     Standing balance support: No upper extremity supported, Reliant on assistive device for balance, During functional activity, Bilateral upper extremity supported Standing balance-Leahy Scale: Poor Standing balance comment: frequent LOB, worsened without use of AD                           ADL either performed or assessed with clinical judgement   ADL Overall ADL's : Needs assistance/impaired Eating/Feeding: Independent   Grooming: Minimal assistance;Standing;Wash/dry hands Grooming Details (indicate cue type and reason): to correct balance Upper Body Bathing: Set up;Sitting   Lower Body Bathing: Sitting/lateral leans;Sit to/from stand;Moderate assistance   Upper Body Dressing : Set up;Sitting   Lower Body Dressing: Moderate assistance;Sitting/lateral leans;Sit to/from stand   Toilet Transfer: Minimal assistance;Ambulation;Rolling walker (2 wheels) Toilet Transfer Details (indicate cue type and reason): cues for turning with RW, Min A to steady with LOB standing from toilet Toileting- Clothing Manipulation and Hygiene: Minimal assistance;Sitting/lateral lean;Sit to/from stand Toileting - Clothing Manipulation Details (indicate cue type and reason): balance with clothing mgmt, able to manage hygiene seated on toilet     Functional mobility during ADLs: Minimal assistance;Rolling walker (2 wheels);Moderate assistance General ADL Comments: Significant balance deficits without AD requiring up to Mod A to walk. Improved with RW to frequent MIn A to correct balance.  Vision Baseline Vision/History: 1 Wears glasses;3 Glaucoma;6 Macular Degeneration Ability to See in Adequate Light: 1 Impaired Patient Visual Report: No change from baseline Vision  Assessment?: Vision impaired- to be further tested in functional context Additional Comments: reports glaucoma worsening and macular degeneration at baseline. when asked about diplopia noted in neurology note, pt reports oh that was a long time ago. denied diplopia this AM     Perception         Praxis         Pertinent Vitals/Pain Pain Assessment Pain Assessment: No/denies pain     Extremity/Trunk Assessment Upper Extremity Assessment Upper Extremity Assessment: Overall WFL for tasks assessed;Right hand dominant   Lower Extremity Assessment Lower Extremity Assessment: Defer to PT evaluation   Cervical / Trunk Assessment Cervical / Trunk Assessment: Normal   Communication Communication Communication: No apparent difficulties   Cognition Arousal: Alert Behavior During Therapy: WFL for tasks assessed/performed Cognition: Cognition impaired     Awareness: Intellectual awareness intact, Online awareness impaired   Attention impairment (select first level of impairment): Selective attention Executive functioning impairment (select all impairments): Reasoning, Problem solving OT - Cognition Comments: shows insight into balance deficits but not the translation to high fall risk if home today                 Following commands: Impaired Following commands impaired: Only follows one step commands consistently, Follows one step commands with increased time, Follows multi-step commands inconsistently     Cueing  General Comments   Cueing Techniques: Verbal cues;Gestural cues      Exercises     Shoulder Instructions      Home Living Family/patient expects to be discharged to:: Private residence Living Arrangements: Spouse/significant other Available Help at Discharge: Family Type of Home: House Home Access: Stairs to enter [employer] of Steps: 7 + 7 Entrance Stairs-Rails: Right;Left Home Layout: Two level;Laundry or work area in  Artist of Steps: flight to basement   [employer] Shower/Tub: Doctor, general practice: None          Prior Functioning/Environment Prior Level of Function : Independent/Modified Independent;Driving             Mobility Comments: reports no AD for mobility ADLs Comments: reports Indep with ADLs, IADLs, driving, grocery shopping and active at baseline    OT Problem List: Impaired balance (sitting and/or standing);Decreased activity tolerance;Decreased knowledge of use of DME or AE;Decreased cognition;Decreased safety awareness   OT Treatment/Interventions: Self-care/ADL training;Therapeutic exercise;Energy conservation;DME and/or AE instruction;Therapeutic activities;Patient/family education;Balance training      OT Goals(Current goals can be found in the care plan section)   Acute Rehab OT Goals Patient Stated Goal: improve balance, get back home OT Goal Formulation: With patient Time For Goal Achievement: 02/23/24 Potential to Achieve Goals: Good   OT Frequency:  Min 2X/week    Co-evaluation              AM-PAC OT 6 Clicks Daily Activity     Outcome Measure Help from another person eating meals?: None Help from another person taking care of personal grooming?: A Little Help from another person toileting, which includes using toliet, bedpan, or urinal?: A Little Help from another person bathing (including washing, rinsing, drying)?: A Lot Help from another person to put on and taking off regular upper body clothing?: A Little Help from another person to put on and taking off regular lower body clothing?:  A Lot 6 Click Score: 17   End of Session Equipment Utilized During Treatment: Gait belt;Rolling walker (2 wheels) Nurse Communication: Mobility status  Activity Tolerance: Patient tolerated treatment well Patient left: in bed;with call bell/phone within reach;with nursing/sitter in  room  OT Visit Diagnosis: Other abnormalities of gait and mobility (R26.89);Unsteadiness on feet (R26.81)                Time: 9081-9062 OT Time Calculation (min): 19 min Charges:  OT General Charges $OT Visit: 1 Visit OT Evaluation $OT Eval Moderate Complexity: 1 Mod  Mliss NOVAK, OTR/L Acute Rehab Services Office: [phone]   Mliss Fish 02/09/2024, 10:20 AM

## 2024-02-09 NOTE — ED Notes (Signed)
 Patient transported to MRI

## 2024-02-09 NOTE — Evaluation (Signed)
 Physical Therapy Evaluation Patient Details Name: Karla Richards MRN: [id number] DOB: 1946/02/27 Today's Date: 02/09/2024  History of Present Illness  78 yo female presents to [facility] 6/25 for SOB, speech difficulty. Similar ED visits 6/18 and 6/19, first time pt sent home and second time pt left before seeing MD. Workup for acute encephalopathy. PMH includes HTN, CKD IIIA, osteoporosis, CAD, OSA, glaucoma, macular degeneration.  Clinical Impression   Pt presents with functional weakness, impaired balance with occasional steadying assist needed, impaired activity tolerance vs pt reported baseline. Pt to benefit from acute PT to address deficits. Pt ambulated good hallway distance without AD, does lose balance towards R, at times able to self correct but also needs PT steadying assist. PT discussed concerns about pt balance and fall risk, pt states she feels close to baseline and will have family to assist her as needed. PT encouraged pt to have husband be standby assist upon d/c home for balance as needed, pt expresses understanding. Pt declines follow up PT once d/c from acute setting. PT to progress mobility as tolerated, and will continue to follow acutely.          If plan is discharge home, recommend the following: A little help with walking and/or transfers;A little help with bathing/dressing/bathroom   Can travel by private vehicle        Equipment Recommendations None recommended by PT  Recommendations for Other Services       Functional Status Assessment Patient has had a recent decline in their functional status and demonstrates the ability to make significant improvements in function in a reasonable and predictable amount of time.     Precautions / Restrictions Precautions Precautions: Fall Restrictions Weight Bearing Restrictions Per Provider Order: No      Mobility  Bed Mobility Overal bed mobility: Needs Assistance Bed Mobility: Supine to Sit, Sit to Supine      Supine to sit: Supervision, HOB elevated, Used rails Sit to supine: Supervision, HOB elevated, Used rails        Transfers Overall transfer level: Needs assistance Equipment used: None Transfers: Sit to/from Stand Sit to Stand: Contact guard assist           General transfer comment: slow to rise and sit, no physical assist    Ambulation/Gait Ambulation/Gait assistance: Contact guard assist, Min assist Gait Distance (Feet): 200 Feet Assistive device: None Gait Pattern/deviations: Step-through pattern, Staggering right, Drifts right/left, Decreased stride length Gait velocity: decr     General Gait Details: pt veering R especially with challenge (head turns, 90 deg turns), occasionally requiring light assist to steady but otherwise pt-corrected  Stairs Stairs: Yes (simulated stairs with holding onto single railing and standing marches x10, min posterior bias but improved with repeated reps)          Wheelchair Mobility     Tilt Bed    Modified Rankin (Stroke Patients Only)       Balance Overall balance assessment: Needs assistance Sitting-balance support: No upper extremity supported, Feet supported Sitting balance-Leahy Scale: Fair     Standing balance support: No upper extremity supported, Reliant on assistive device for balance, During functional activity, Bilateral upper extremity supported Standing balance-Leahy Scale: Poor Standing balance comment: fair to poor, periods of needing light steadying assist to correct LOB towards R                             Pertinent Vitals/Pain Pain Assessment Pain Assessment: Faces Faces  Pain Scale: Hurts a little bit Pain Location: neck, head (intermittent) Pain Descriptors / Indicators: Sore Pain Intervention(s): Limited activity within patient's tolerance, Monitored during session, Repositioned    Home Living Family/patient expects to be discharged to:: Private residence Living Arrangements:  Spouse/significant other Available Help at Discharge: Family Type of Home: House Home Access: Stairs to enter Entrance Stairs-Rails: Doctor, general practice of Steps: 7 + 7 Alternate Level Stairs-Number of Steps: flight to basement Home Layout: Two level;Laundry or work area in [employer]: None      Prior Function Prior Level of Function : Independent/Modified Independent;Driving             Mobility Comments: reports no AD for mobility ADLs Comments: reports Indep with ADLs, IADLs, driving, grocery shopping and active at baseline     Extremity/Trunk Assessment   Upper Extremity Assessment Upper Extremity Assessment: Defer to OT evaluation    Lower Extremity Assessment Lower Extremity Assessment: Generalized weakness    Cervical / Trunk Assessment Cervical / Trunk Assessment: Normal  Communication   Communication Communication: No apparent difficulties    Cognition Arousal: Alert Behavior During Therapy: WFL for tasks assessed/performed                             Following commands: Impaired Following commands impaired: Only follows one step commands consistently, Follows one step commands with increased time     Cueing Cueing Techniques: Verbal cues, Gestural cues     General Comments General comments (skin integrity, edema, etc.): vss    Exercises     Assessment/Plan    PT Assessment Patient needs continued PT services  PT Problem List Decreased strength;Decreased mobility;Decreased activity tolerance;Decreased balance;Decreased knowledge of use of DME;Pain;Decreased safety awareness       PT Treatment Interventions Therapeutic activities;DME instruction;Therapeutic exercise;Gait training;Patient/family education;Balance training;Stair training;Functional mobility training;Neuromuscular re-education    PT Goals (Current goals can be found in the Care Plan section)  Acute Rehab PT Goals PT Goal Formulation: With  patient Time For Goal Achievement: 02/23/24 Potential to Achieve Goals: Good    Frequency Min 2X/week     Co-evaluation               AM-PAC PT 6 Clicks Mobility  Outcome Measure Help needed turning from your back to your side while in a flat bed without using bedrails?: A Little Help needed moving from lying on your back to sitting on the side of a flat bed without using bedrails?: A Little Help needed moving to and from a bed to a chair (including a wheelchair)?: A Little Help needed standing up from a chair using your arms (e.g., wheelchair or bedside chair)?: A Little Help needed to walk in hospital room?: A Little Help needed climbing 3-5 steps with a railing? : A Little 6 Click Score: 18    End of Session Equipment Utilized During Treatment: Gait belt Activity Tolerance: Patient tolerated treatment well Patient left: in bed;with call bell/phone within reach;Other (comment) (rails up, on ED stretcher) Nurse Communication: Mobility status PT Visit Diagnosis: Other abnormalities of gait and mobility (R26.89);Unsteadiness on feet (R26.81)    Time: 1002-1020 PT Time Calculation (min) (ACUTE ONLY): 18 min   Charges:   PT Evaluation $PT Eval Low Complexity: 1 Low   PT General Charges $$ ACUTE PT VISIT: 1 Visit         Johana RAMAN, PT DPT Acute Rehabilitation Services Secure Chat Preferred  Office [phone]  Tereasa Yilmaz E Stroup 02/09/2024, 10:57 AM

## 2024-02-10 DIAGNOSIS — G9340 Encephalopathy, unspecified: Secondary | ICD-10-CM | POA: Diagnosis not present

## 2024-02-10 LAB — CBC
HCT: 36.6 % (ref 36.0–46.0)
Hemoglobin: 11.7 g/dL — ABNORMAL LOW (ref 12.0–15.0)
MCH: 29.6 pg (ref 26.0–34.0)
MCHC: 32 g/dL (ref 30.0–36.0)
MCV: 92.7 fL (ref 80.0–100.0)
Platelets: 194 10*3/uL (ref 150–400)
RBC: 3.95 MIL/uL (ref 3.87–5.11)
RDW: 14 % (ref 11.5–15.5)
WBC: 4.4 10*3/uL (ref 4.0–10.5)
nRBC: 0 % (ref 0.0–0.2)

## 2024-02-10 LAB — CALCIUM, IONIZED: Calcium, Ionized, Serum: 5.2 mg/dL (ref 4.5–5.6)

## 2024-02-10 LAB — BASIC METABOLIC PANEL WITH GFR
Anion gap: 9 (ref 5–15)
BUN: 14 mg/dL (ref 8–23)
CO2: 21 mmol/L — ABNORMAL LOW (ref 22–32)
Calcium: 9.3 mg/dL (ref 8.9–10.3)
Chloride: 107 mmol/L (ref 98–111)
Creatinine, Ser: 1.01 mg/dL — ABNORMAL HIGH (ref 0.44–1.00)
GFR, Estimated: 57 mL/min — ABNORMAL LOW (ref >60)
Glucose, Bld: 94 mg/dL (ref 70–99)
Potassium: 4.5 mmol/L (ref 3.5–5.1)
Sodium: 137 mmol/L (ref 135–145)

## 2024-02-10 MED ORDER — CYANOCOBALAMIN 1000 MCG PO TABS: 1000.0000 ug | ORAL_TABLET | Freq: Every day | ORAL | Status: DC

## 2024-02-10 NOTE — Care Management Obs Status (Signed)
 MEDICARE OBSERVATION STATUS NOTIFICATION   Patient Details  Name: Karla Richards MRN: [id number] Date of Birth: [birth year]   Medicare Observation Status Notification Given:    Letter signed and copy provided   [name] 02/10/2024, 11:28 AM

## 2024-02-10 NOTE — Plan of Care (Signed)
 Reviewing Pt's plan of care. Pt is progressing. She is alert, oriented x 2, stable hemodynamically, afebrile, on room air, normal respiratory effort, no acute distress noted overnight. She is able to rest well with no major complaints. We will continue to monitor.  Problem: Education: Goal: Knowledge of General Education information will improve Description: Including pain rating scale, medication(s)/side effects and non-pharmacologic comfort measures Outcome: Progressing   Problem: Clinical Measurements: Goal: Ability to maintain clinical measurements within normal limits will improve Outcome: Progressing Goal: Will remain free from infection Outcome: Progressing Goal: Diagnostic test results will improve Outcome: Progressing Goal: Respiratory complications will improve Outcome: Progressing Goal: Cardiovascular complication will be avoided Outcome: Progressing   Problem: Activity: Goal: Risk for activity intolerance will decrease Outcome: Progressing   Problem: Nutrition: Goal: Adequate nutrition will be maintained Outcome: Progressing   Problem: Coping: Goal: Level of anxiety will decrease Outcome: Progressing   Problem: Elimination: Goal: Will not experience complications related to bowel motility Outcome: Progressing Goal: Will not experience complications related to urinary retention Outcome: Progressing   Problem: Safety: Goal: Ability to remain free from injury will improve Outcome: Progressing   Problem: Skin Integrity: Goal: Risk for impaired skin integrity will decrease Outcome: Progressing   Wendi Dash, RN

## 2024-02-10 NOTE — TOC Transition Note (Signed)
 Transition of Care [facility]) - Discharge Note   Patient Details  Name: Karla Richards MRN: [id number] Date of Birth: 15-Jul-1946  Transition of Care [facility] CM/SW Contact:  Roxie KANDICE Stain, RN Phone Number: 02/10/2024, 9:46 AM   Clinical Narrative:    Patient stable for discharge. Patient lives with spouse.  Orders for home health.  This RNCM offered choice for Home Health, Ellesse McCoy-Williams states she has no preference, RNCM made referral to Amy with [facility], She is able to take referral. No DME recommended.  Address, Phone number and PCP verified.  Final next level of care: Home w Home Health Services Barriers to Discharge: Barriers Resolved   Patient Goals and CMS Choice Patient states their goals for this hospitalization and ongoing recovery are:: Return home CMS Medicare.gov Compare Post Acute Care list provided to:: Patient Choice offered to / list presented to : Patient      Discharge Placement               Home        Discharge Plan and Services Additional resources added to the After Visit Summary for     Discharge Planning Services: CM Consult                      HH Arranged: PT, OT [facility] Agency: [facility] Date [facility] Agency Contacted: 02/10/24 Time HH Agency Contacted: 0945 Representative spoke with at [facility] Agency: Amy  Social Drivers of Health (SDOH) Interventions SDOH Screenings   Food Insecurity: No Food Insecurity (02/09/2024)  Housing: Low Risk  (02/09/2024)  Transportation Needs: No Transportation Needs (02/09/2024)  Utilities: Not At Risk (02/09/2024)  Social Connections: Moderately Integrated (02/09/2024)  Tobacco Use: Low Risk  (02/09/2024)     Readmission Risk Interventions     No data to display

## 2024-02-10 NOTE — Progress Notes (Signed)
 Patient verbalized understanding of dc instructions. All belongings with patient. Will transfer patient to lounge.

## 2024-02-10 NOTE — Discharge Summary (Addendum)
 Physician Discharge Summary   Patient: Karla Richards MRN: [id number] DOB: 06-15-1946  Admit date:     02/08/2024  Discharge date: 02/10/24  Discharge Physician: Lonni SHAUNNA Dalton   PCP: Benjamine Aland, MD     Recommendations at discharge:  Follow upw with Dr. Benjamine in 1 week for confusion Follow up with [facility] for neurocognitive testing for early dementia  Follow up with Psychiatry for suspected panic attacks Dr. Benjamine: Please recheck B12 level in 6-8 weeks Please ensure referral for neurocognitive testing for suspected dementia Please refer to Psychiatry for panic attacks and start Buspar or SSRI as appropriate See below re: lung nodule Please follow MMA and thiamine levels Please adjust levothyroxine as appropriate given elevated TSH     Discharge Diagnoses: Principal Problem:   Shortness of breath Active Problems:   Cognitive impairment   Encephalopathy ruled out   HTN (hypertension)   Stage 3a chronic kidney disease (HCC)   Hypokalemia   Multiple chemical sensitivity syndrome     Hospital course: 78 y.o. F with HTN, hypothyroidism, multiple chemical sensitivities, presented with shortness of breath, repeatedly.    She experienced three episodes of severe distress and hyperventilation in the past week, leading to emergency room visits. During these episodes, she feels severely distressed, hyperventilates, and experiences shaking throughout her body. She describes these episodes as 'unbearable' and notes that they are accompanied by moaning and an inability to communicate effectively. She recalls being unable to answer questions or follow commands during these episodes, such as stating the year or her last name, and she mistakenly raised the wrong leg when asked. Her husband reports that during the first episode, she was unable to walk to the car and required EMS transport. In subsequent episodes, she was able to walk but still felt the need to visit  the emergency room. During the most recent episode, she was shaking and moaning in the car and was unresponsive to her husband's attempts to communicate. She has been given Ativan  during these episodes, with marked improvement in her symptoms.   In the ER on admission, given she could not communicate, CODE STROKE was activated out of abundance of caution.    On Neurology consultation, they found the following: She has a history of panic attacks, which she experienced many years ago, but she does not recognize these current episodes as similar. She has been told by multiple providers that these episodes may be panic attacks, but she is unsure what she might be panicking over. She has a history of anxiety and has experienced panic attacks in the past, particularly during stressful periods in her life (last episodes were approximately 30 years ago when she was in the middle of a stressful divorce).  Her sister has contacted her husband with concerns that the patient needs to be in therapy for these episodes   She has a history of a suspected stroke two years ago, which was later clarified by a neurologist at [facility] as not being a stroke. This was apparently triggered by episode at a baseball game where she felt faint and was cooled down but did not seek immediate medical attention. She later saw her family doctor, who initially suggested she had a stroke, leading to therapy for balance and speech issues.  On review of her neurology note from 05/25/2021 she had been evaluated for hyponatremia and weakness, and there was a possible concern for stroke on MRI but repeat MRI was negative and the initial finding was felt to  be artifactual.  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  Headaches and nausea improved with improved sleep hygiene and increased fluid intake.   In the hospital, she underwent CT head, CTA head and neck, and MRI brain.  CTA showed no significant atherosclerosis.  CT and MRI brain showed chronic small vessel disease.  Ammonia, folate, normal.  TSH elevated but fT4 normal.  Thiamine, MMA levels pending at discharge.  B12 level low and started on vitamin B12 here.    In addition, Neurology recommended: - Outpatient follow-up with psychiatry - Outpatient neurocognitive evaluation  Given concerns for memory loss, I recommend against Xanax at this time.             The [place]  Controlled Substances Registry was reviewed for this patient prior to discharge.   Consultants: Neurology Procedures performed: CT head MRI brain CTA head and neck   Disposition: Home   DISCHARGE MEDICATION: Allergies as of 02/10/2024       Reactions   Fish Allergy Anaphylaxis   Justicia Adhatoda Anaphylaxis   Mango Flavoring Agent (non-screening) Anaphylaxis   Other Anaphylaxis   Tree nuts   Peanuts [peanut Oil] Anaphylaxis   Penicillins Anaphylaxis   Shellfish Allergy Swelling   Ciprofloxacin Hcl Other (See Comments)   Pt can take pill but no the drops   Coconut Fatty Acid Other (See Comments)   Unknown   Fluorescein -benoxinate Itching, Swelling   Eye irritation (lid swelling, itching)   Seconal [secobarbital Sodium] Other (See Comments)   Pt. felt like she was on fire, states her Mom told she passed out   Turmeric Other (See Comments)   Unknown    Ace Inhibitors Hives   Alcohol-sulfur [elemental Sulfur] Other (See Comments)   Sleepy   Aspirin  Other (See Comments)   Abdominal irritation   Beta Adrenergic Blockers Hives   Brimonidine   Itching, Other (See Comments)   Puffy eyes - Alphagan     Caffeine Other (See Comments)   Jittery, feels nervous   Iodine Solution [povidone Iodine] Itching, Other (See Comments)   Topical solution caused severe itching   Latex Itching   Milk-related Compounds Other (See Comments)   Fluid in the ear and dizziness    Motrin [ibuprofen] Other (See Comments)   Upset stomach   Norvasc [amlodipine] Hives, Swelling   Sulfa Antibiotics Other (See Comments)   Can take with Benadryl         Medication List     TAKE these medications    Assure ID Duo Pro Pen Needles 31G X 5 MM Misc Generic drug: Insulin  Pen Needle 1 each by Does not apply route daily.   Calcium  600+D Plus Minerals 600-400 MG-UNIT Tabs Take 1 tablet by mouth in the morning and at bedtime.   cyanocobalamin  1000 MCG tablet Take 1 tablet (1,000 mcg total) by mouth daily. Start taking on: February 11, 2024   fluorouracil  5 % cream Commonly known as: EFUDEX  Apply topically 2 (two) times daily.   hydrALAZINE  50 MG tablet Commonly known as: APRESOLINE  Take 50-100 mg by mouth See admin instructions. Take  1 tablet (50mg ) by mouth in the morning, 1 tablet at noon and take 2 tablets (100mg ) in the evening.   levothyroxine 25 MCG tablet Commonly known as: SYNTHROID Take 25 mcg by mouth daily before breakfast.   MULTIVITAMIN ADULTS PO Take 1 tablet by mouth daily.   pantoprazole  40 MG tablet Commonly known as: Protonix  Take 1 tablet (40 mg total) by mouth daily. What changed:  when to take this reasons to take this   tretinoin  0.05 % cream Commonly known as: RETIN-A  Apply 1 application topically at bedtime.   Tymlos  3120 MCG/1.56ML Sopn Generic drug: Abaloparatide  Inject 80 mcg into the skin daily.   valsartan 160 MG tablet Commonly known as: DIOVAN Take 160 mg by mouth daily.   Vitamin D  (Ergocalciferol ) 1.25 MG (50000 UNIT) Caps capsule Commonly known as: DRISDOL  Take 1 capsule (50,000 Units total) by mouth  every 7 (seven) days.        Follow-up Information     [facility] Follow up.   Why: Home health arranged. They will contact you within 48hrs post discharg to schedule apt. Contact information: [address] [place] [address] [phone]         Benjamine Aland, MD Follow up in 2 week(s).   Specialty: Family Medicine Why: Hospital follow up Contact information: [address] [place] [address] [phone]         Avi Livings, DO Follow up.   Specialty: Neurology Contact information: [address] [place] [address] [phone]                 Discharge Instructions     Discharge instructions   Complete by: As directed    **IMPORTANT DISCHARGE INSTRUCTIONS**   From Dr. Jonel: You were evaluated for trouble breathing.  Here we found that your breathing was normal, but you seemed confused and because we couldn't initially tell if this was a panic attack or a mini-stroke, you were admitted for further testing and Neurology evaluation.  Here, your testing was reassuring.  You did not have a stroke.  Our neurologists felt that you did not have a mini-stroke.  They did feel that you may have early dementia.  They recommend that you see your neurologist for neurocognitive testing in 6-12 weeks Call your Neurologist, Dr. Mandadi for an appointment (See below in the TO DO section, Dr. Avi is the [facility]ist at [facility] that you saw last)  They do believe you may be having panic attacks, and so we recommend you follow up with Dr. Benjamine asap, and ask for a referral to psychiatry.  Resume your normal home medicines  Your vitamin B12 level is slightly low, please start taking vitamin B12 (cyanocobalamin ) 1000 mcg once daily  Ask Dr. Benjamine to recheck your B12 level in 6 weeks   Increase activity slowly   Complete by: As directed        Discharge Exam: Filed Weights   02/09/24 1653   Weight: 41.3 kg    General: Pt is alert, awake, not in acute distress Cardiovascular: RRR, nl S1-S2, no murmurs appreciated.   No LE edema.   Respiratory: Normal respiratory rate and rhythm.  CTAB without rales or wheezes. Abdominal: Abdomen soft and non-tender.  No distension or HSM.   Neuro/Psych: Strength symmetric in upper and lower extremities.  Judgment and insight appear slightly impaired but at baseline. Oriented to person, place and time, Tangential and  short term memory appears slightly impaired.   Condition at discharge: fair  The results of significant diagnostics from this hospitalization (including imaging, microbiology, ancillary and laboratory) are listed below for reference.   Imaging Studies: MR BRAIN WO CONTRAST Result Date: 02/09/2024 CLINICAL DATA:  Initial evaluation for acute neuro deficit, stroke suspected. EXAM: MRI HEAD WITHOUT CONTRAST TECHNIQUE: Multiplanar, multiecho pulse sequences of the brain and surrounding structures were obtained without intravenous contrast. COMPARISON:  CTs from 02/08/2024. FINDINGS: Brain: Cerebral volume within normal limits for age. Patchy T2/FLAIR hyperintensity involving the periventricular deep white matter both cerebral hemispheres, consistent with chronic small vessel ischemic disease, moderate in nature. Remote lacunar infarct present at the left thalamus. No abnormal foci of restricted diffusion to suggest acute or subacute ischemia. Gray-white matter differentiation maintained. No areas of chronic cortical infarction. No acute intracranial hemorrhage. Few scattered chronic micro hemorrhages noted, likely hypertensive in nature. No mass lesion, midline shift or mass effect. No hydrocephalus or extra-axial fluid collection. Pituitary gland within normal limits. Vascular: Major intracranial vascular flow voids are maintained. Skull and upper cervical spine: Craniocervical junction within normal limits. Bone marrow signal intensity  normal. No scalp soft tissue abnormality. Sinuses/Orbits: Prior bilateral ocular lens replacement. Paranasal sinuses are largely clear. Trace bilateral mastoid effusions noted, of doubtful significance. Other: None. IMPRESSION: 1. No acute intracranial abnormality. 2. Moderate chronic microvascular ischemic disease with remote left thalamic lacunar infarct. Electronically Signed   By: Morene Hoard M.D.   On: 02/09/2024 02:59   CT HEAD CODE STROKE WO CONTRAST Result Date: 02/09/2024 CLINICAL DATA:  Initial evaluation for acute neuro deficit, stroke suspected. No other relevant history provided. EXAM: CT HEAD WITHOUT CONTRAST CT ANGIOGRAPHY HEAD AND NECK TECHNIQUE: Multidetector CT imaging of the head and neck was performed using the standard protocol during bolus administration of intravenous contrast. Multiplanar CT image reconstructions and MIPs were obtained to evaluate the vascular anatomy. Carotid stenosis measurements (when applicable) are obtained utilizing NASCET criteria, using the distal internal carotid diameter as the denominator. RADIATION DOSE REDUCTION: This exam was performed according to the departmental dose-optimization program which includes automated exposure control, adjustment of the mA and/or kV according to patient size and/or use of iterative reconstruction technique. CONTRAST:  75mL OMNIPAQUE  IOHEXOL  350 MG/ML SOLN COMPARISON:  Prior study from 02/01/2024. FINDINGS: CT HEAD FINDINGS Brain: Cerebral volume within normal limits. Chronic microvascular ischemic disease with a few small remote lacunar infarcts about the basal ganglia and thalami. No acute intracranial hemorrhage. No acute large vessel territory infarct. No mass lesion or midline shift. No hydrocephalus or extra-axial fluid collection. Vascular: No abnormal hyperdense vessel. Scattered vascular calcifications noted within the carotid siphons. Skull: Scalp soft tissues within normal limits.  Calvarium intact.  Sinuses/Orbits: Globes orbital soft tissues within normal limits. Scattered mucosal thickening present about the ethmoidal air cells. Paranasal sinuses are otherwise clear. No significant mastoid effusion. Other: None. Review of the MIP images confirms the above findings CTA NECK FINDINGS Aortic arch: Standard branching. Imaged portion shows no evidence of aneurysm or dissection. No significant stenosis of the major arch vessel origins. Right carotid system: No evidence of dissection, stenosis (50% or greater), or occlusion. Left carotid system: No evidence of dissection, stenosis (50% or greater), or occlusion. Vertebral arteries: Codominant. No evidence of dissection, stenosis (50% or greater), Skeleton: No worrisome osseous lesions. Moderately advanced cervical spondylosis. Other neck: No other acute finding. Upper chest: Few small pulmonary nodules present within the left upper lobe, largest of which measures 5 mm (series  15, image 24). No other acute finding. Review of the MIP images confirms the above findings CTA HEAD FINDINGS Anterior circulation: Atheromatous change about the carotid siphons without hemodynamically significant stenosis. A1 segments patent bilaterally. Normal anterior communicating artery complex. Anterior cerebral arteries widely patent. No M1 stenosis or occlusion. Distal MCA branches perfused and symmetric. Posterior circulation: Both V4 segments patent without significant stenosis. Both PICA patent at their origins. Basilar patent without stenosis. Superior cerebral arteries patent bilaterally. Both PCA supplied via the basilar as well as small bilateral posterior arteries. Left PCA patent without stenosis. Focal moderate proximal right P2 stenosis (series 20, image 44). Right PCA otherwise patent. Venous sinuses: Grossly patent allowing for timing the contrast bolus. Anatomic variants: None significant.  No aneurysm. Review of the MIP images confirms the above findings IMPRESSION: CT  HEAD: 1. No acute intracranial abnormality. 2. Aspects is 10. 3. Chronic microvascular ischemic disease with a few small remote lacunar infarcts about the basal ganglia and thalami. CTA HEAD AND NECK: 1. Negative CTA for large vessel occlusion or other emergent finding. 2. Focal moderate proximal right P2 stenosis. 3. Otherwise wide patency of the major arterial vasculature of the head and neck. No other hemodynamically significant or correctable stenosis. 4. Few small pulmonary nodules within the left upper lobe, largest of which measures 5 mm. Per Fleischner Society Guidelines, if patient is low risk for malignancy, no routine follow-up imaging is recommended. If patient is high risk for malignancy, a non-contrast Chest CT at 12 months is optional. If performed and the nodule is stable at 12 months, no further follow-up is recommended. These guidelines do not apply to immunocompromised patients and patients with cancer. Follow up in patients with significant comorbidities as clinically warranted. For lung cancer screening, adhere to Lung-RADS guidelines. Reference: Radiology. 2017; 284(1):228-43. Results were called by telephone at the time of interpretation on 02/09/2024 at 12:09 am to provider [facility] , who verbally acknowledged these results. Electronically Signed   By: Morene Hoard M.D.   On: 02/09/2024 00:13   CT ANGIO HEAD NECK W WO CM (CODE STROKE) Result Date: 02/09/2024 CLINICAL DATA:  Initial evaluation for acute neuro deficit, stroke suspected. No other relevant history provided. EXAM: CT HEAD WITHOUT CONTRAST CT ANGIOGRAPHY HEAD AND NECK TECHNIQUE: Multidetector CT imaging of the head and neck was performed using the standard protocol during bolus administration of intravenous contrast. Multiplanar CT image reconstructions and MIPs were obtained to evaluate the vascular anatomy. Carotid stenosis measurements (when applicable) are obtained utilizing NASCET criteria, using the distal  internal carotid diameter as the denominator. RADIATION DOSE REDUCTION: This exam was performed according to the departmental dose-optimization program which includes automated exposure control, adjustment of the mA and/or kV according to patient size and/or use of iterative reconstruction technique. CONTRAST:  75mL OMNIPAQUE  IOHEXOL  350 MG/ML SOLN COMPARISON:  Prior study from 02/01/2024. FINDINGS: CT HEAD FINDINGS Brain: Cerebral volume within normal limits. Chronic microvascular ischemic disease with a few small remote lacunar infarcts about the basal ganglia and thalami. No acute intracranial hemorrhage. No acute large vessel territory infarct. No mass lesion or midline shift. No hydrocephalus or extra-axial fluid collection. Vascular: No abnormal hyperdense vessel. Scattered vascular calcifications noted within the carotid siphons. Skull: Scalp soft tissues within normal limits.  Calvarium intact. Sinuses/Orbits: Globes orbital soft tissues within normal limits. Scattered mucosal thickening present about the ethmoidal air cells. Paranasal sinuses are otherwise clear. No significant mastoid effusion. Other: None. Review of the MIP images confirms the above findings  CTA NECK FINDINGS Aortic arch: Standard branching. Imaged portion shows no evidence of aneurysm or dissection. No significant stenosis of the major arch vessel origins. Right carotid system: No evidence of dissection, stenosis (50% or greater), or occlusion. Left carotid system: No evidence of dissection, stenosis (50% or greater), or occlusion. Vertebral arteries: Codominant. No evidence of dissection, stenosis (50% or greater), Skeleton: No worrisome osseous lesions. Moderately advanced cervical spondylosis. Other neck: No other acute finding. Upper chest: Few small pulmonary nodules present within the left upper lobe, largest of which measures 5 mm (series 15, image 24). No other acute finding. Review of the MIP images confirms the above findings  CTA HEAD FINDINGS Anterior circulation: Atheromatous change about the carotid siphons without hemodynamically significant stenosis. A1 segments patent bilaterally. Normal anterior communicating artery complex. Anterior cerebral arteries widely patent. No M1 stenosis or occlusion. Distal MCA branches perfused and symmetric. Posterior circulation: Both V4 segments patent without significant stenosis. Both PICA patent at their origins. Basilar patent without stenosis. Superior cerebral arteries patent bilaterally. Both PCA supplied via the basilar as well as small bilateral posterior arteries. Left PCA patent without stenosis. Focal moderate proximal right P2 stenosis (series 20, image 44). Right PCA otherwise patent. Venous sinuses: Grossly patent allowing for timing the contrast bolus. Anatomic variants: None significant.  No aneurysm. Review of the MIP images confirms the above findings IMPRESSION: CT HEAD: 1. No acute intracranial abnormality. 2. Aspects is 10. 3. Chronic microvascular ischemic disease with a few small remote lacunar infarcts about the basal ganglia and thalami. CTA HEAD AND NECK: 1. Negative CTA for large vessel occlusion or other emergent finding. 2. Focal moderate proximal right P2 stenosis. 3. Otherwise wide patency of the major arterial vasculature of the head and neck. No other hemodynamically significant or correctable stenosis. 4. Few small pulmonary nodules within the left upper lobe, largest of which measures 5 mm. Per Fleischner Society Guidelines, if patient is low risk for malignancy, no routine follow-up imaging is recommended. If patient is high risk for malignancy, a non-contrast Chest CT at 12 months is optional. If performed and the nodule is stable at 12 months, no further follow-up is recommended. These guidelines do not apply to immunocompromised patients and patients with cancer. Follow up in patients with significant comorbidities as clinically warranted. For lung cancer  screening, adhere to Lung-RADS guidelines. Reference: Radiology. 2017; 284(1):228-43. Results were called by telephone at the time of interpretation on 02/09/2024 at 12:09 am to provider [facility] , who verbally acknowledged these results. Electronically Signed   By: Morene Hoard M.D.   On: 02/09/2024 00:13   DG Chest 2 View Result Date: 02/08/2024 CLINICAL DATA:  sob EXAM: CHEST - 2 VIEW COMPARISON:  Chest x-ray 02/01/2024, CT angio chest 02/01/2024 FINDINGS: The heart and mediastinal contours are unchanged. Atherosclerotic plaque. No focal consolidation. Chronic coarsened interstitial markings with no overt pulmonary edema. No pleural effusion. No pneumothorax. No acute osseous abnormality. IMPRESSION: 1. No active cardiopulmonary disease. 2. Aortic Atherosclerosis (ICD10-I70.0) and Emphysema (ICD10-J43.9). Electronically Signed   By: Morgane  Naveau M.D.   On: 02/08/2024 22:55   CT Angio Chest PE W and/or Wo Contrast Result Date: 02/01/2024 CLINICAL DATA:  78 year old female with altered mental status, shortness of breath. EXAM: CT ANGIOGRAPHY CHEST WITH CONTRAST TECHNIQUE: Multidetector CT imaging of the chest was performed using the standard protocol during bolus administration of intravenous contrast. Multiplanar CT image reconstructions and MIPs were obtained to evaluate the vascular anatomy. RADIATION DOSE REDUCTION: This exam was  performed according to the departmental dose-optimization program which includes automated exposure control, adjustment of the mA and/or kV according to patient size and/or use of iterative reconstruction technique. CONTRAST:  60mL OMNIPAQUE  IOHEXOL  350 MG/ML SOLN COMPARISON:  Chest radiographs 0156 hours today. FINDINGS: Cardiovascular: Excellent contrast bolus timing in the pulmonary arterial tree. No pulmonary artery filling defect. Calcified coronary artery atherosclerosis (series 11, image 172). Little contrast in the aorta. Heart size within normal  limits. No pericardial effusion. Mediastinum/Nodes: Negative for mediastinal mass or lymphadenopathy. Lungs/Pleura: Central airways are patent. Low normal lung volumes. Mild and fairly symmetric costophrenic angle curvilinear opacity most resembles atelectasis or scarring. No pleural effusion, confluent or suspicious lung opacity. Upper Abdomen: Negative early arterial enhanced appearance of the visible liver, gallbladder, spleen, adrenal glands and kidneys. Pancreas and proximal small bowel appear indistinct, which might be artifact from early contrast timing. No pneumoperitoneum or free fluid in the visible upper abdomen. Musculoskeletal: Osteopenia. Advanced thoracic disc and endplate degeneration. Widespread thoracic endplate osteophytes but no convincing vertebral ankylosis. No acute or suspicious osseous lesion identified. Review of the MIP images confirms the above findings. IMPRESSION: 1. Negative for acute pulmonary embolus. 2. No acute finding identified in the chest. 3. Calcified coronary artery, Aortic Atherosclerosis (ICD10-I70.0). Electronically Signed   By: VEAR Hurst M.D.   On: 02/01/2024 04:34   CT Head Wo Contrast Result Date: 02/01/2024 CLINICAL DATA:  78 year old female with altered mental status, shortness of breath. EXAM: CT HEAD WITHOUT CONTRAST TECHNIQUE: Contiguous axial images were obtained from the base of the skull through the vertex without intravenous contrast. RADIATION DOSE REDUCTION: This exam was performed according to the departmental dose-optimization program which includes automated exposure control, adjustment of the mA and/or kV according to patient size and/or use of iterative reconstruction technique. COMPARISON:  Brain MRI 03/01/2021.  Head CT 04/15/2023. FINDINGS: Brain: Cerebral volume remains normal for age. No midline shift, ventriculomegaly, mass effect, evidence of mass lesion, intracranial hemorrhage or evidence of cortically based acute infarction. Moderate patchy,  asymmetric cerebral white matter hypodensity including deep white matter capsule involvement in the left hemisphere. Chronic left thalamic lacunar infarct. Stable gray-white matter differentiation throughout the brain. No cortical encephalomalacia identified. Vascular: No suspicious intracranial vascular hyperdensity. Calcified atherosclerosis at the skull base. Skull: Stable and intact. Sinuses/Orbits: Visualized paranasal sinuses and mastoids are stable and well aerated. Other: No acute orbit or scalp soft tissue finding. IMPRESSION: 1. No acute intracranial abnormality. 2. Stable non contrast CT appearance of moderate for age chronic small vessel disease. Electronically Signed   By: VEAR Hurst M.D.   On: 02/01/2024 04:27   DG Chest 2 View Result Date: 02/01/2024 EXAM: 2 VIEW(S) XRAY OF THE CHEST 02/01/2024 02:00:00 AM COMPARISON: 01/06/2024 CLINICAL HISTORY: sob. Shortness of breath FINDINGS: LUNGS AND PLEURA: No focal pulmonary opacity. No pulmonary edema. No pleural effusion. No pneumothorax. HEART AND MEDIASTINUM: No acute abnormality of the cardiac and mediastinal silhouettes. BONES AND SOFT TISSUES: Mild degenerative changes of the visualized thoracolumbar spine. No acute osseous abnormality. IMPRESSION: 1. No acute process. Electronically signed by: Pinkie Pebbles MD 02/01/2024 02:03 AM EDT RP Workstation: [id number]    Microbiology: Results for orders placed or performed during the hospital encounter of 10/21/23  SARS CORONAVIRUS 2 (TAT 6-24 HRS) Anterior Nasal Swab     Status: None   Collection Time: 10/21/23  3:00 PM   Specimen: Anterior Nasal Swab  Result Value Ref Range Status   SARS Coronavirus 2 NEGATIVE NEGATIVE Final    Comment: (  NOTE) SARS-CoV-2 target nucleic acids are NOT DETECTED.  The SARS-CoV-2 RNA is generally detectable in upper and lower respiratory specimens during the acute phase of infection. Negative results do not preclude SARS-CoV-2 infection, do not rule  out co-infections with other pathogens, and should not be used as the sole basis for treatment or other patient management decisions. Negative results must be combined with clinical observations, patient history, and epidemiological information. The expected result is Negative.  Fact Sheet for Patients: HairSlick.no  Fact Sheet for Healthcare Providers: quierodirigir.com  This test is not yet approved or cleared by the [place]  FDA and  has been authorized for detection and/or diagnosis of SARS-CoV-2 by FDA under an Emergency Use Authorization (EUA). This EUA will remain  in effect (meaning this test can be used) for the duration of the COVID-19 declaration under Se ction 564(b)(1) of the Act, 21 U.S.C. section 360bbb-3(b)(1), unless the authorization is terminated or revoked sooner.  Performed at [facility], [address], [place], [place] [address]     Labs: CBC: Recent Labs  Lab 02/08/24 2330 02/09/24 0008 02/10/24 0508  WBC 5.8  --  4.4  NEUTROABS 2.7  --   --   HGB 13.2 12.2 11.7*  HCT 40.5 36.0 36.6  MCV 90.0  --  92.7  PLT 242  --  194   Basic Metabolic Panel: Recent Labs  Lab 02/08/24 2330 02/09/24 0008 02/09/24 1151 02/10/24 0508  NA 137 135 136 137  K 3.3* 3.1* 5.7* 4.5  CL 103 104 104 107  CO2 22  --  24 21*  GLUCOSE 89 100* 78 94  BUN 16 14 15 14   CREATININE 1.06* 1.10* 1.01* 1.01*  CALCIUM  10.3  --  10.5* 9.3  MG  --   --  2.2  --   PHOS  --   --  2.9  --    Liver Function Tests: Recent Labs  Lab 02/08/24 2330  AST 28  ALT 14  ALKPHOS 65  BILITOT 0.8  PROT 7.5  ALBUMIN 4.4   CBG: Recent Labs  Lab 02/08/24 2347  GLUCAP 100*    Discharge time spent: approximately 45 minutes spent on discharge counseling, evaluation of patient on day of discharge, and coordination of discharge planning with nursing, social work, pharmacy and case management  Signed: Lonni SHAUNNA Dalton, MD [facility] 02/10/2024

## 2024-02-11 LAB — VITAMIN B1: Vitamin B1 (Thiamine): 90.3 nmol/L (ref 66.5–200.0)

## 2024-02-11 LAB — METHYLMALONIC ACID, SERUM: Methylmalonic Acid, Quantitative: 1075 nmol/L — ABNORMAL HIGH (ref 0–378)

## 2024-02-13 ENCOUNTER — Other Ambulatory Visit: Payer: Self-pay | Admitting: *Deleted

## 2024-02-13 ENCOUNTER — Other Ambulatory Visit: Payer: Self-pay

## 2024-02-13 ENCOUNTER — Telehealth: Payer: Self-pay | Admitting: *Deleted

## 2024-02-13 ENCOUNTER — Other Ambulatory Visit (HOSPITAL_COMMUNITY): Payer: Self-pay

## 2024-02-13 MED ORDER — TERIPARATIDE 560 MCG/2.24ML ~~LOC~~ SOPN
20.0000 ug | PEN_INJECTOR | Freq: Every day | SUBCUTANEOUS | 12 refills | Status: DC
  Filled 2024-02-13: qty 2.24, fill #0
  Filled 2024-02-13: qty 2.24, 28d supply, fill #0

## 2024-02-13 NOTE — Telephone Encounter (Signed)
 Pharmacy Patient Advocate Encounter  Received notification from [employer] that Prior Authorization for [place]  has been DENIED.      PA #/Case ID/Reference #: [id number]

## 2024-02-13 NOTE — Telephone Encounter (Signed)
 Medical Buy and Zell  Patient is ready for scheduling on or after: 02/14/24   Out-of-pocket cost due at time of visit: $0  Primary: Karla Richards  Medicare Prolia co-insurance: 20% (approximately $333.71) Admin fee co-insurance: 20% (approximately $25)  Deductible: $257 ($257 met)  Prior Auth: NOT required  Secondary: [employer] med supp This is a Medicare Supplement Plan G and it covers the Medicare Part B coinsurance and 100% of the excess charges.  ** This summary of benefits is an estimation of the patient's out-of-pocket cost. Exact cost may vary based on individual plan coverage.

## 2024-02-15 ENCOUNTER — Other Ambulatory Visit (HOSPITAL_COMMUNITY): Payer: Self-pay

## 2024-02-15 NOTE — Progress Notes (Signed)
 Office is going to do buy/bill for Prolia. Dis-enrolling

## 2024-02-16 ENCOUNTER — Emergency Department (HOSPITAL_COMMUNITY): Admission: EM | Admit: 2024-02-16 | Discharge: 2024-02-16 | Disposition: A | Discharge status: Home / Self Care

## 2024-02-16 ENCOUNTER — Encounter (HOSPITAL_COMMUNITY): Payer: Self-pay | Admitting: *Deleted

## 2024-02-16 ENCOUNTER — Other Ambulatory Visit: Payer: Self-pay

## 2024-02-16 DIAGNOSIS — Z91010 Allergy to peanuts: Secondary | ICD-10-CM | POA: Insufficient documentation

## 2024-02-16 DIAGNOSIS — R064 Hyperventilation: Secondary | ICD-10-CM | POA: Diagnosis not present

## 2024-02-16 DIAGNOSIS — F419 Anxiety disorder, unspecified: Secondary | ICD-10-CM | POA: Diagnosis not present

## 2024-02-16 DIAGNOSIS — Z91040 Latex allergy status: Secondary | ICD-10-CM | POA: Insufficient documentation

## 2024-02-16 DIAGNOSIS — I10 Essential (primary) hypertension: Secondary | ICD-10-CM | POA: Diagnosis not present

## 2024-02-16 DIAGNOSIS — E876 Hypokalemia: Secondary | ICD-10-CM | POA: Diagnosis not present

## 2024-02-16 DIAGNOSIS — Z79899 Other long term (current) drug therapy: Secondary | ICD-10-CM | POA: Diagnosis not present

## 2024-02-16 DIAGNOSIS — R069 Unspecified abnormalities of breathing: Secondary | ICD-10-CM | POA: Diagnosis not present

## 2024-02-16 DIAGNOSIS — Z794 Long term (current) use of insulin: Secondary | ICD-10-CM | POA: Diagnosis not present

## 2024-02-16 DIAGNOSIS — R457 State of emotional shock and stress, unspecified: Secondary | ICD-10-CM | POA: Diagnosis not present

## 2024-02-16 LAB — URINALYSIS, ROUTINE W REFLEX MICROSCOPIC
Bilirubin Urine: NEGATIVE
Glucose, UA: NEGATIVE mg/dL
Hgb urine dipstick: NEGATIVE
Ketones, ur: NEGATIVE mg/dL
Leukocytes,Ua: NEGATIVE
Nitrite: NEGATIVE
Protein, ur: NEGATIVE mg/dL
Specific Gravity, Urine: 1.009 (ref 1.005–1.030)
pH: 7 (ref 5.0–8.0)

## 2024-02-16 LAB — CBC WITH DIFFERENTIAL/PLATELET
Abs Immature Granulocytes: 0.01 10*3/uL (ref 0.00–0.07)
Basophils Absolute: 0 10*3/uL (ref 0.0–0.1)
Basophils Relative: 1 %
Eosinophils Absolute: 0 10*3/uL (ref 0.0–0.5)
Eosinophils Relative: 1 %
HCT: 39.8 % (ref 36.0–46.0)
Hemoglobin: 12.5 g/dL (ref 12.0–15.0)
Immature Granulocytes: 0 %
Lymphocytes Relative: 26 %
Lymphs Abs: 1.4 10*3/uL (ref 0.7–4.0)
MCH: 29.1 pg (ref 26.0–34.0)
MCHC: 31.4 g/dL (ref 30.0–36.0)
MCV: 92.8 fL (ref 80.0–100.0)
Monocytes Absolute: 0.5 10*3/uL (ref 0.1–1.0)
Monocytes Relative: 8 %
Neutro Abs: 3.5 10*3/uL (ref 1.7–7.7)
Neutrophils Relative %: 64 %
Platelets: 217 10*3/uL (ref 150–400)
RBC: 4.29 MIL/uL (ref 3.87–5.11)
RDW: 14.4 % (ref 11.5–15.5)
WBC: 5.5 10*3/uL (ref 4.0–10.5)
nRBC: 0 % (ref 0.0–0.2)

## 2024-02-16 LAB — COMPREHENSIVE METABOLIC PANEL WITH GFR
ALT: 15 U/L (ref 0–44)
AST: 27 U/L (ref 15–41)
Albumin: 4.2 g/dL (ref 3.5–5.0)
Alkaline Phosphatase: 62 U/L (ref 38–126)
Anion gap: 11 (ref 5–15)
BUN: 17 mg/dL (ref 8–23)
CO2: 21 mmol/L — ABNORMAL LOW (ref 22–32)
Calcium: 9.9 mg/dL (ref 8.9–10.3)
Chloride: 106 mmol/L (ref 98–111)
Creatinine, Ser: 1.02 mg/dL — ABNORMAL HIGH (ref 0.44–1.00)
GFR, Estimated: 56 mL/min — ABNORMAL LOW (ref >60)
Glucose, Bld: 91 mg/dL (ref 70–99)
Potassium: 3.4 mmol/L — ABNORMAL LOW (ref 3.5–5.1)
Sodium: 138 mmol/L (ref 135–145)
Total Bilirubin: 0.5 mg/dL (ref 0.0–1.2)
Total Protein: 7.6 g/dL (ref 6.5–8.1)

## 2024-02-16 LAB — ETHANOL: Alcohol, Ethyl (B): <15 mg/dL (ref <15)

## 2024-02-16 MED ORDER — HYDROXYZINE HCL 25 MG PO TABS: 12.5000 mg | ORAL_TABLET | Freq: Three times a day (TID) | ORAL | 0 refills | Status: DC | PRN

## 2024-02-16 MED ORDER — LORAZEPAM 1 MG PO TABS
1.0000 mg | ORAL_TABLET | Freq: Once | ORAL | Status: AC
  Administered 2024-02-16: 1 mg via ORAL
  Filled 2024-02-16: qty 1

## 2024-02-16 NOTE — ED Provider Notes (Signed)
 [place] EMERGENCY DEPARTMENT AT [facility] Provider Note   CSN: [id number] Arrival date & time: 02/16/24  9143     Patient presents with: Medical vs Psych   Karla Richards is a 78 y.o. female.   HPI  States that she came to the hospital this morning because she was hyperventilating.  Patient states that she needs medical help.  She denies any kind of headaches.  No vision changes.  No chest pain.  No shortness of breath.  No nausea vomiting or diarrhea.  No abdominal pain.  No numbness or ting anywhere.  No diplopia.  Denies any SI or HI.  Patient states that she is not seen a psychiatrist outpatient in some time.  Patient states that she just felt very stressed this morning.  Denies all other complaints.  Previous medical history reviewed : Patient was just admitted and discharged on June 27.  He was seen because she was having these episodes of unbearable hyperventilation episodes with shaking throughout her body.  Ativan  has helped her symptoms in the past.  Code stroke was called during last ED visit.  She was found to have a history of panic attacks.  His last admission, underwent CT head, CTA head and neck, MRI brain.  Shows a small chronic vessel disease but otherwise unremarkable.     Prior to Admission medications   Medication Sig Start Date End Date Taking? Authorizing Provider  Calcium  Carbonate-Vit D-Min (CALCIUM  600+D PLUS MINERALS) 600-400 MG-UNIT TABS Take 1 tablet by mouth in the morning and at bedtime. 12/14/23   Cleatrice Ludie SAUNDERS, MD  cyanocobalamin  1000 MCG tablet Take 1 tablet (1,000 mcg total) by mouth daily. 02/11/24   Danford, Lonni SQUIBB, MD  fluorouracil  (EFUDEX ) 5 % cream Apply topically 2 (two) times daily. 08/18/23   [provider]  hydrALAZINE  (APRESOLINE ) 50 MG tablet Take 50-100 mg by mouth See admin instructions. Take 1 tablet (50mg ) by mouth in the morning, 1 tablet at noon and take 2 tablets (100mg ) in the evening.     [provider]  Insulin  Pen Needle (ASSURE ID DUO PRO PEN NEEDLES) 31G X 5 MM MISC 1 each by Does not apply route daily. 02/08/24   Cleatrice Ludie SAUNDERS, MD  levothyroxine (SYNTHROID) 25 MCG tablet Take 25 mcg by mouth daily before breakfast. 12/21/23   [provider]  Multiple Vitamins-Minerals (MULTIVITAMIN ADULTS PO) Take 1 tablet by mouth daily.    [provider]  pantoprazole  (PROTONIX ) 40 MG tablet Take 1 tablet (40 mg total) by mouth daily. Patient taking differently: Take 40 mg by mouth daily as needed. 10/21/22   Patsey Lot, MD  Teriparatide (FORTEO) 560 MCG/2.24ML SOPN Inject 20 mcg into the skin daily. 02/13/24   Hudnall, Ludie SAUNDERS, MD  tretinoin  (RETIN-A ) 0.05 % cream Apply 1 application topically at bedtime. 11/23/19   [provider]  valsartan (DIOVAN) 160 MG tablet Take 160 mg by mouth daily.    [provider]  Vitamin D , Ergocalciferol , (DRISDOL ) 1.25 MG (50000 UNIT) CAPS capsule Take 1 capsule (50,000 Units total) by mouth every 7 (seven) days. 12/14/23   Cleatrice Ludie SAUNDERS, MD    Allergies: Fish allergy, Justicia adhatoda, Mango flavoring agent (non-screening), Other, Peanuts [peanut oil], Penicillins, Shellfish allergy, Ciprofloxacin hcl, Coconut fatty acid, Fluorescein -benoxinate, Seconal [secobarbital sodium], Turmeric, Ace inhibitors, Alcohol-sulfur [elemental sulfur], Aspirin , Beta adrenergic blockers, Brimonidine , Caffeine, Iodine solution [povidone iodine], Latex, Milk-related compounds, Motrin [ibuprofen], Norvasc [amlodipine], and Sulfa antibiotics    Review of  Systems  Constitutional:  Negative for chills and fever.  HENT:  Negative for ear pain and sore throat.   Eyes:  Negative for pain and visual disturbance.  Respiratory:  Negative for cough and shortness of breath.   Cardiovascular:  Negative for chest pain and palpitations.  Gastrointestinal:  Negative for abdominal pain and vomiting.  Genitourinary:  Negative for dysuria  and hematuria.  Musculoskeletal:  Negative for arthralgias and back pain.  Skin:  Negative for color change and rash.  Neurological:  Negative for seizures and syncope.  All other systems reviewed and are negative.   Updated Vital Signs BP (!) 151/75   Pulse (!) 56   Temp 97.7 F (36.5 C) (Axillary)   Resp 14   Wt 45.1 kg   SpO2 99%   BMI 20.43 kg/m   Physical Exam Vitals and nursing note reviewed.  Constitutional:      General: She is not in acute distress.    Appearance: She is well-developed.  HENT:     Head: Normocephalic and atraumatic.  Eyes:     Conjunctiva/sclera: Conjunctivae normal.  Cardiovascular:     Rate and Rhythm: Normal rate and regular rhythm.     Heart sounds: No murmur heard. Pulmonary:     Effort: Pulmonary effort is normal. No respiratory distress.     Breath sounds: Normal breath sounds.  Abdominal:     Palpations: Abdomen is soft.     Tenderness: There is no abdominal tenderness.  Musculoskeletal:        General: No swelling.     Cervical back: Neck supple.  Skin:    General: Skin is warm and dry.     Capillary Refill: Capillary refill takes less than 2 seconds.  Neurological:     Mental Status: She is alert and oriented to person, place, and time.     GCS: GCS eye subscore is 4. GCS verbal subscore is 5. GCS motor subscore is 6.     Cranial Nerves: Cranial nerves 2-12 are intact. No cranial nerve deficit.     Sensory: Sensation is intact. No sensory deficit.     Motor: Motor function is intact. No weakness or pronator drift.     Coordination: Coordination is intact. Romberg sign negative. Finger-Nose-Finger Test normal.     Gait: Gait is intact.  Psychiatric:        Mood and Affect: Mood normal.     (all labs ordered are listed, but only abnormal results are displayed) Labs Reviewed  COMPREHENSIVE METABOLIC PANEL WITH GFR - Abnormal; Notable for the following components:      Result Value   Potassium 3.4 (*)    CO2 21 (*)     Creatinine, Ser 1.02 (*)    GFR, Estimated 56 (*)    All other components within normal limits  ETHANOL  CBC WITH DIFFERENTIAL/PLATELET  URINALYSIS, ROUTINE W REFLEX MICROSCOPIC    EKG: EKG Interpretation Date/Time:  Thursday February 16 2024 09:14:15 EDT Ventricular Rate:  65 PR Interval:  148 QRS Duration:  99 QT Interval:  403 QTC Calculation: 419 R Axis:   76  Text Interpretation: Sinus rhythm Confirmed by Simon Rea [phone]) on 02/16/2024 10:03:26 AM  Radiology: No results found.   Procedures   Medications Ordered in the ED  LORazepam  (ATIVAN ) tablet 1 mg (1 mg Oral Given 02/16/24 0926)                      NIH Stroke Scale: 0  Medical Decision Making Amount and/or Complexity of Data Reviewed Labs: ordered.  Risk Prescription drug management.    States that she came to the hospital this morning because she was hyperventilating.  Patient states that she needs medical help.  She denies any kind of headaches.  No vision changes.  No chest pain.  No shortness of breath.  No nausea vomiting or diarrhea.  No abdominal pain.  No numbness or ting anywhere.  No diplopia.  Denies any SI or HI.  Patient states that she is not seen a psychiatrist outpatient in some time.  Patient states that she just felt very stressed this morning.  Denies all other complaints.  Previous medical history reviewed : Patient was just admitted and discharged on June 27.  He was seen because she was having these episodes of unbearable hyperventilation episodes with shaking throughout her body.  Ativan  has helped her symptoms in the past.  Code stroke was called during last ED visit.  She was found to have a history of panic attacks.  His last admission, underwent CT head, CTA head and neck, MRI brain.  Shows a small chronic vessel disease but otherwise unremarkable.  Previous medical history reviewed : Patient was just admitted and discharged on June 27.  He was seen because she was having  these episodes of unbearable hyperventilation episodes with shaking throughout her body.  Ativan  has helped her symptoms in the past.  Code stroke was called during last ED visit.  She was found to have a history of panic attacks.  His last admission, underwent CT head, CTA head and neck, MRI brain.  Shows a small chronic vessel disease but otherwise unremarkable.  Upon exam, patient hemodynamically stable.  Slightly hypertensive.  ANO x 3 GCS 15.  NIH is 0.  Cranial nerves II through XII intact.  No focal deficits.  No strength or sensation deficits.  No facial droop.  No dysarthria or aphasia.  Patient was hyperventilating at the beginning. Was able to give patient 1 mg po ativan  and subsequently all of her complaints resolved. Patient recently had admission as above with CT head, CTA head/neck, MRI brain all of which showed no pathology to explain patient presentations. It was felt that it was likely due to anxiety at that time. No concern for CVA at this time.   Labs largely benign. No metabolic derangement.   I have provided patient discharge information with outpatient psychiatric resources. She will follow up with these resources to better address anxiety.          Final diagnoses:  Anxiety  Hyperventilating    ED Discharge Orders     None          Simon Lavonia SAILOR, MD 02/16/24 [phone]

## 2024-02-16 NOTE — ED Provider Notes (Signed)
  Physical Exam  BP (!) 151/75   Pulse (!) 56   Temp 97.7 F (36.5 C) (Axillary)   Resp 14   Wt 45.1 kg   SpO2 99%   BMI 20.43 kg/m   Physical Exam  Procedures  Procedures  ED Course / MDM    Medical Decision Making Amount and/or Complexity of Data Reviewed Labs: ordered.  Risk Prescription drug management.   Received signout.  Possibility of anxiety.  Well-appearing.  Workup reassuring.  Negative urinalysis.  Discharged home.  Will give a few pills of Vistaril to help with anxiety to take as needed.       Patsey Lot, MD 02/16/24 1740

## 2024-02-16 NOTE — ED Notes (Signed)
 Purewick placed because pt is incontinent

## 2024-02-16 NOTE — Discharge Instructions (Signed)
 Please follow-up with behavioral health outpatient.  The contact information is above.  They can get you pretty much same-day appointments.  I think this would benefit her to get further psychiatric help outpatient.  She probably needs to be on chronic outpatient medication.

## 2024-02-16 NOTE — ED Notes (Signed)
 Attempted to call husband, left voicemail about pt discharge

## 2024-02-16 NOTE — ED Triage Notes (Addendum)
 BIB [facility] with ? Anxiety started this am. Pt thrashing around on stretcher and bed, stating you wrong, you wrong 178/80-92-22-100% RA CBG 98  Pt states she is hyperventilating. Explained to pt if she is aware then she can control her breathing. States I did on the ambulance now stating you lied, You lied

## 2024-02-16 NOTE — ED Notes (Signed)
 Pt resting with eye closed at present time, No noted s/s of distress.

## 2024-02-20 DIAGNOSIS — F32A Depression, unspecified: Secondary | ICD-10-CM | POA: Diagnosis not present

## 2024-02-20 DIAGNOSIS — I129 Hypertensive chronic kidney disease with stage 1 through stage 4 chronic kidney disease, or unspecified chronic kidney disease: Secondary | ICD-10-CM | POA: Diagnosis not present

## 2024-02-20 DIAGNOSIS — M810 Age-related osteoporosis without current pathological fracture: Secondary | ICD-10-CM | POA: Diagnosis not present

## 2024-02-20 DIAGNOSIS — E039 Hypothyroidism, unspecified: Secondary | ICD-10-CM | POA: Diagnosis not present

## 2024-02-20 DIAGNOSIS — F410 Panic disorder [episodic paroxysmal anxiety] without agoraphobia: Secondary | ICD-10-CM | POA: Diagnosis not present

## 2024-02-20 DIAGNOSIS — N189 Chronic kidney disease, unspecified: Secondary | ICD-10-CM | POA: Diagnosis not present

## 2024-02-20 NOTE — Telephone Encounter (Signed)
 Patient is ready for scheduling on or after: 02/20/24  BUY AND BILL  Out-of-pocket cost due at time of visit: $0  Primary: [employer] Medicare  [employer] co-insurance: 20% (approximately 482.41) Admin fee co-insurance: 20% (approximately $25)  Deductible: $257 ($257 met)  Prior Auth: Not required  Secondary: [employer]  For the Secondary MD Purchase access option, this plan is a Medicare Supplement Plan G and it covers the Medicare Part B co-insurance and 100% of the excess charges. This plan does not cover the Medicare Part B deductible   ** This summary of benefits is an estimation of the patient's out-of-pocket cost. Exact cost may vary based on individual plan coverage.

## 2024-02-21 ENCOUNTER — Telehealth: Payer: Self-pay | Admitting: *Deleted

## 2024-02-21 NOTE — Telephone Encounter (Signed)
 Called pt to give her the evenity benefits of $0 if our office orders the medication.  Pt states before starting any osteoporosis medication, her daughter/son would like to talk to the doctor about the efficacy and side effects of starting this treatment.  Told patient we could set up a virtual visit where she would have her daughter/son on 3-way to ask all pertinent questions during that visit. Pt agreed to 03/06/24 for that virtual visit.

## 2024-02-25 ENCOUNTER — Emergency Department (HOSPITAL_COMMUNITY)

## 2024-02-25 ENCOUNTER — Inpatient Hospital Stay (HOSPITAL_COMMUNITY)
Admission: EM | Admit: 2024-02-25 | Discharge: 2024-03-02 | DRG: 056 | Disposition: A | Discharge status: Home Health | Attending: Internal Medicine | Admitting: Internal Medicine

## 2024-02-25 DIAGNOSIS — Z91040 Latex allergy status: Secondary | ICD-10-CM | POA: Diagnosis not present

## 2024-02-25 DIAGNOSIS — E538 Deficiency of other specified B group vitamins: Secondary | ICD-10-CM | POA: Diagnosis not present

## 2024-02-25 DIAGNOSIS — E871 Hypo-osmolality and hyponatremia: Secondary | ICD-10-CM | POA: Diagnosis not present

## 2024-02-25 DIAGNOSIS — Z91010 Allergy to peanuts: Secondary | ICD-10-CM

## 2024-02-25 DIAGNOSIS — R531 Weakness: Principal | ICD-10-CM

## 2024-02-25 DIAGNOSIS — E038 Other specified hypothyroidism: Secondary | ICD-10-CM | POA: Diagnosis not present

## 2024-02-25 DIAGNOSIS — Z91013 Allergy to seafood: Secondary | ICD-10-CM

## 2024-02-25 DIAGNOSIS — G9340 Encephalopathy, unspecified: Secondary | ICD-10-CM

## 2024-02-25 DIAGNOSIS — Z8673 Personal history of transient ischemic attack (TIA), and cerebral infarction without residual deficits: Secondary | ICD-10-CM

## 2024-02-25 DIAGNOSIS — F410 Panic disorder [episodic paroxysmal anxiety] without agoraphobia: Secondary | ICD-10-CM | POA: Diagnosis present

## 2024-02-25 DIAGNOSIS — N1831 Chronic kidney disease, stage 3a: Secondary | ICD-10-CM | POA: Diagnosis not present

## 2024-02-25 DIAGNOSIS — Z79890 Hormone replacement therapy (postmenopausal): Secondary | ICD-10-CM | POA: Diagnosis not present

## 2024-02-25 DIAGNOSIS — H409 Unspecified glaucoma: Secondary | ICD-10-CM | POA: Diagnosis present

## 2024-02-25 DIAGNOSIS — R4701 Aphasia: Secondary | ICD-10-CM | POA: Diagnosis present

## 2024-02-25 DIAGNOSIS — F0284 Dementia in other diseases classified elsewhere, unspecified severity, with anxiety: Secondary | ICD-10-CM | POA: Diagnosis not present

## 2024-02-25 DIAGNOSIS — Z8249 Family history of ischemic heart disease and other diseases of the circulatory system: Secondary | ICD-10-CM

## 2024-02-25 DIAGNOSIS — Z886 Allergy status to analgesic agent status: Secondary | ICD-10-CM

## 2024-02-25 DIAGNOSIS — I160 Hypertensive urgency: Secondary | ICD-10-CM | POA: Diagnosis not present

## 2024-02-25 DIAGNOSIS — F02818 Dementia in other diseases classified elsewhere, unspecified severity, with other behavioral disturbance: Secondary | ICD-10-CM | POA: Diagnosis present

## 2024-02-25 DIAGNOSIS — N179 Acute kidney failure, unspecified: Secondary | ICD-10-CM | POA: Diagnosis not present

## 2024-02-25 DIAGNOSIS — I2510 Atherosclerotic heart disease of native coronary artery without angina pectoris: Secondary | ICD-10-CM | POA: Diagnosis present

## 2024-02-25 DIAGNOSIS — Z91041 Radiographic dye allergy status: Secondary | ICD-10-CM

## 2024-02-25 DIAGNOSIS — F419 Anxiety disorder, unspecified: Secondary | ICD-10-CM | POA: Diagnosis not present

## 2024-02-25 DIAGNOSIS — Z79899 Other long term (current) drug therapy: Secondary | ICD-10-CM

## 2024-02-25 DIAGNOSIS — Z85828 Personal history of other malignant neoplasm of skin: Secondary | ICD-10-CM

## 2024-02-25 DIAGNOSIS — Z823 Family history of stroke: Secondary | ICD-10-CM

## 2024-02-25 DIAGNOSIS — G4730 Sleep apnea, unspecified: Secondary | ICD-10-CM | POA: Diagnosis present

## 2024-02-25 DIAGNOSIS — K219 Gastro-esophageal reflux disease without esophagitis: Secondary | ICD-10-CM | POA: Diagnosis present

## 2024-02-25 DIAGNOSIS — R404 Transient alteration of awareness: Secondary | ICD-10-CM | POA: Diagnosis not present

## 2024-02-25 DIAGNOSIS — I129 Hypertensive chronic kidney disease with stage 1 through stage 4 chronic kidney disease, or unspecified chronic kidney disease: Secondary | ICD-10-CM | POA: Diagnosis not present

## 2024-02-25 DIAGNOSIS — Z880 Allergy status to penicillin: Secondary | ICD-10-CM

## 2024-02-25 DIAGNOSIS — I6629 Occlusion and stenosis of unspecified posterior cerebral artery: Secondary | ICD-10-CM | POA: Diagnosis not present

## 2024-02-25 DIAGNOSIS — Z882 Allergy status to sulfonamides status: Secondary | ICD-10-CM

## 2024-02-25 DIAGNOSIS — F32A Depression, unspecified: Secondary | ICD-10-CM | POA: Diagnosis not present

## 2024-02-25 DIAGNOSIS — G3183 Dementia with Lewy bodies: Secondary | ICD-10-CM | POA: Diagnosis not present

## 2024-02-25 DIAGNOSIS — I10 Essential (primary) hypertension: Secondary | ICD-10-CM | POA: Diagnosis not present

## 2024-02-25 DIAGNOSIS — Z1152 Encounter for screening for COVID-19: Secondary | ICD-10-CM | POA: Diagnosis not present

## 2024-02-25 DIAGNOSIS — G9341 Metabolic encephalopathy: Secondary | ICD-10-CM | POA: Diagnosis not present

## 2024-02-25 DIAGNOSIS — Z91011 Allergy to milk products: Secondary | ICD-10-CM

## 2024-02-25 DIAGNOSIS — R4182 Altered mental status, unspecified: Secondary | ICD-10-CM | POA: Diagnosis not present

## 2024-02-25 DIAGNOSIS — F0282 Dementia in other diseases classified elsewhere, unspecified severity, with psychotic disturbance: Secondary | ICD-10-CM | POA: Diagnosis present

## 2024-02-25 DIAGNOSIS — Z888 Allergy status to other drugs, medicaments and biological substances status: Secondary | ICD-10-CM | POA: Diagnosis not present

## 2024-02-25 DIAGNOSIS — Z91018 Allergy to other foods: Secondary | ICD-10-CM

## 2024-02-25 DIAGNOSIS — R4189 Other symptoms and signs involving cognitive functions and awareness: Secondary | ICD-10-CM | POA: Diagnosis not present

## 2024-02-25 DIAGNOSIS — R569 Unspecified convulsions: Secondary | ICD-10-CM | POA: Diagnosis not present

## 2024-02-25 LAB — DIFFERENTIAL
Abs Immature Granulocytes: 0.02 K/uL (ref 0.00–0.07)
Basophils Absolute: 0 K/uL (ref 0.0–0.1)
Basophils Relative: 0 %
Eosinophils Absolute: 0 K/uL (ref 0.0–0.5)
Eosinophils Relative: 0 %
Immature Granulocytes: 0 %
Lymphocytes Relative: 12 %
Lymphs Abs: 0.9 K/uL (ref 0.7–4.0)
Monocytes Absolute: 0.8 K/uL (ref 0.1–1.0)
Monocytes Relative: 9 %
Neutro Abs: 6.3 K/uL (ref 1.7–7.7)
Neutrophils Relative %: 79 %

## 2024-02-25 LAB — I-STAT CHEM 8, ED
BUN: 7 mg/dL — ABNORMAL LOW (ref 8–23)
Calcium, Ion: 1.12 mmol/L — ABNORMAL LOW (ref 1.15–1.40)
Chloride: 96 mmol/L — ABNORMAL LOW (ref 98–111)
Creatinine, Ser: 0.8 mg/dL (ref 0.44–1.00)
Glucose, Bld: 149 mg/dL — ABNORMAL HIGH (ref 70–99)
HCT: 41 % (ref 36.0–46.0)
Hemoglobin: 13.9 g/dL (ref 12.0–15.0)
Potassium: 3.1 mmol/L — ABNORMAL LOW (ref 3.5–5.1)
Sodium: 129 mmol/L — ABNORMAL LOW (ref 135–145)
TCO2: 20 mmol/L — ABNORMAL LOW (ref 22–32)

## 2024-02-25 LAB — CBG MONITORING, ED: Glucose-Capillary: 148 mg/dL — ABNORMAL HIGH (ref 70–99)

## 2024-02-25 LAB — CBC
HCT: 38.5 % (ref 36.0–46.0)
Hemoglobin: 13.2 g/dL (ref 12.0–15.0)
MCH: 30 pg (ref 26.0–34.0)
MCHC: 34.3 g/dL (ref 30.0–36.0)
MCV: 87.5 fL (ref 80.0–100.0)
Platelets: 291 K/uL (ref 150–400)
RBC: 4.4 MIL/uL (ref 3.87–5.11)
RDW: 13.8 % (ref 11.5–15.5)
WBC: 8.1 K/uL (ref 4.0–10.5)
nRBC: 0 % (ref 0.0–0.2)

## 2024-02-25 LAB — COMPREHENSIVE METABOLIC PANEL WITH GFR
ALT: 16 U/L (ref 0–44)
AST: 31 U/L (ref 15–41)
Albumin: 4 g/dL (ref 3.5–5.0)
Alkaline Phosphatase: 60 U/L (ref 38–126)
Anion gap: 13 (ref 5–15)
BUN: 9 mg/dL (ref 8–23)
CO2: 19 mmol/L — ABNORMAL LOW (ref 22–32)
Calcium: 9.4 mg/dL (ref 8.9–10.3)
Chloride: 94 mmol/L — ABNORMAL LOW (ref 98–111)
Creatinine, Ser: 0.88 mg/dL (ref 0.44–1.00)
GFR, Estimated: >60 mL/min (ref >60)
Glucose, Bld: 147 mg/dL — ABNORMAL HIGH (ref 70–99)
Potassium: 3 mmol/L — ABNORMAL LOW (ref 3.5–5.1)
Sodium: 126 mmol/L — ABNORMAL LOW (ref 135–145)
Total Bilirubin: 0.8 mg/dL (ref 0.0–1.2)
Total Protein: 7.2 g/dL (ref 6.5–8.1)

## 2024-02-25 LAB — PROTIME-INR
INR: 1 (ref 0.8–1.2)
Prothrombin Time: 13.2 s (ref 11.4–15.2)

## 2024-02-25 LAB — ETHANOL: Alcohol, Ethyl (B): <15 mg/dL (ref <15)

## 2024-02-25 LAB — TSH: TSH: 5.936 u[IU]/mL — ABNORMAL HIGH (ref 0.350–4.500)

## 2024-02-25 LAB — APTT: aPTT: 32 s (ref 24–36)

## 2024-02-25 LAB — T4, FREE: Free T4: 1.11 ng/dL (ref 0.61–1.12)

## 2024-02-25 MED ORDER — IOHEXOL 350 MG/ML SOLN
75.0000 mL | Freq: Once | INTRAVENOUS | Status: AC | PRN
  Administered 2024-02-25: 75 mL via INTRAVENOUS

## 2024-02-25 NOTE — ED Triage Notes (Signed)
 Pt here from home lsn at 8 am , called out for altered mental status , pt was then called a code stroke by ems for left side gauze that start in route to hospital ,

## 2024-02-25 NOTE — ED Provider Notes (Signed)
 Care of patient received from prior provider at 11:03 PM, please see their note for complete H/P and care plan.  Received handoff per ED course.  Clinical Course as of 02/26/24 0623  Sat Feb 25, 2024  2302 Stable 79 YOF with AMS Aphasic/Altered LKW last night.  No LVO Likely stroke admission.  [CC]  Sun Feb 26, 2024  0444 CT HEAD CODE STROKE WO CONTRAST [CC]    Clinical Course User Index [CC] Jerral Meth, MD   CRITICAL CARE Performed by: Meth Jerral   Total critical care time: 35 minutes for code stroke evaluation and management  Critical care time was exclusive of separately billable procedures and treating other patients.  Critical care was necessary to treat or prevent imminent or life-threatening deterioration.  Critical care was time spent personally by me on the following activities: development of treatment plan with patient and/or surrogate as well as nursing, discussions with consultants, evaluation of patient's response to treatment, examination of patient, obtaining history from patient or surrogate, ordering and performing treatments and interventions, ordering and review of laboratory studies, ordering and review of radiographic studies, pulse oximetry and re-evaluation of patient's condition.  Reassessment: Multifactorial laboratory abnormalities including hyponatremia, hypokalemia. Neurology has recommended admission to medicine for continued management of the electrolytes, observation for possible toxic overdose of antihistamines and EEG to rule out seizure disorder.  Disposition:   Based on the above findings, I believe this patient is stable for admission.    Patient/family educated about specific findings on our evaluation and explained exact reasons for admission.  Patient/family educated about clinical situation and time was allowed to answer questions.   Admission team communicated with and agreed with need for admission. Patient admitted. Patient  ready to move at this time.     Emergency Department Medication Summary:   Medications  potassium chloride  (KLOR-CON ) packet 40 mEq (40 mEq Oral Given 02/26/24 0053)  iohexol  (OMNIPAQUE ) 350 MG/ML injection 75 mL (75 mLs Intravenous Contrast Given 02/25/24 2136)  potassium chloride  10 mEq in 100 mL IVPB (0 mEq Intravenous Stopped 02/26/24 0207)  sodium chloride  0.9 % bolus 500 mL (0 mLs Intravenous Stopped 02/26/24 0422)            Jerral Meth, MD 02/26/24 [phone]

## 2024-02-25 NOTE — ED Provider Notes (Signed)
 [place] EMERGENCY DEPARTMENT AT [facility] Provider Note   CSN: [id number] Arrival date & time: 02/25/24  2119     Patient presents with: Altered Mental Status   Karla Richards is a 78 y.o. female.   HPI Presents as a code stroke.  Patient currently nonverbal, level 5 caveat.  Per EMS the patient has been withdrawn since approximately 13 hours ago according to family members.  Patient did recently start new medication. EMS reports that en route the patient began demonstrating rigidity and left facial gaze preference.    Prior to Admission medications   Medication Sig Start Date End Date Taking? Authorizing Provider  Calcium  Carbonate-Vit D-Min (CALCIUM  600+D PLUS MINERALS) 600-400 MG-UNIT TABS Take 1 tablet by mouth in the morning and at bedtime. 12/14/23   Hudnall, Ludie SAUNDERS, MD  cyanocobalamin  1000 MCG tablet Take 1 tablet (1,000 mcg total) by mouth daily. 02/11/24   Danford, Lonni SQUIBB, MD  fluorouracil  (EFUDEX ) 5 % cream Apply topically 2 (two) times daily. 08/18/23   [provider]  hydrALAZINE  (APRESOLINE ) 50 MG tablet Take 50-100 mg by mouth See admin instructions. Take 1 tablet (50mg ) by mouth in the morning, 1 tablet at noon and take 2 tablets (100mg ) in the evening.    [provider]  hydrOXYzine  (ATARAX ) 25 MG tablet Take 0.5 tablets (12.5 mg total) by mouth every 8 (eight) hours as needed for anxiety. 02/16/24   Patsey Lot, MD  Insulin  Pen Needle (ASSURE ID DUO PRO PEN NEEDLES) 31G X 5 MM MISC 1 each by Does not apply route daily. 02/08/24   Cleatrice Ludie SAUNDERS, MD  levothyroxine  (SYNTHROID ) 25 MCG tablet Take 25 mcg by mouth daily before breakfast. 12/21/23   [provider]  Multiple Vitamins-Minerals (MULTIVITAMIN ADULTS PO) Take 1 tablet by mouth daily.    [provider]  pantoprazole  (PROTONIX ) 40 MG tablet Take 1 tablet (40 mg total) by mouth daily. Patient taking differently: Take 40 mg by mouth daily as  needed. 10/21/22   Patsey Lot, MD  Teriparatide  (FORTEO ) 560 MCG/2.24ML SOPN Inject 20 mcg into the skin daily. 02/13/24   Hudnall, Ludie SAUNDERS, MD  tretinoin  (RETIN-A ) 0.05 % cream Apply 1 application topically at bedtime. 11/23/19   [provider]  valsartan  (DIOVAN ) 160 MG tablet Take 160 mg by mouth daily.    [provider]  Vitamin D , Ergocalciferol , (DRISDOL ) 1.25 MG (50000 UNIT) CAPS capsule Take 1 capsule (50,000 Units total) by mouth every 7 (seven) days. 12/14/23   Cleatrice Ludie SAUNDERS, MD    Allergies: Fish allergy, Justicia adhatoda, Mango flavoring agent (non-screening), Other, Peanuts [peanut oil], Penicillins, Shellfish allergy, Ciprofloxacin hcl, Coconut fatty acid, Fluorescein -benoxinate, Seconal [secobarbital sodium], Turmeric, Ace inhibitors, Alcohol-sulfur [elemental sulfur], Aspirin , Beta adrenergic blockers, Brimonidine , Caffeine, Iodine solution [povidone iodine], Latex, Milk-related compounds, Motrin [ibuprofen], Norvasc [amlodipine], and Sulfa antibiotics    Review of Systems  Updated Vital Signs BP (!) 183/89   Pulse 96   Temp 97.7 F (36.5 C) (Axillary)   Resp 13   Wt 42.6 kg   SpO2 94%   BMI 19.29 kg/m   Physical Exam Vitals and nursing note reviewed.  Constitutional:      Appearance: She is well-developed.     Comments: Elderly female essentially noninteractive, left gaze preference, not following commands, nonverbal.  HENT:     Head: Normocephalic and atraumatic.  Eyes:     Conjunctiva/sclera: Conjunctivae normal.  Cardiovascular:     Rate and Rhythm: Normal rate and  regular rhythm.  Pulmonary:     Effort: Pulmonary effort is normal. No respiratory distress.     Breath sounds: Normal breath sounds. No stridor.  Abdominal:     General: There is no distension.  Skin:    General: Skin is warm and dry.  Neurological:     Comments: Patient not following commands, left gaze preference, nonverbal.  Psychiatric:        Mood and Affect:  Mood normal.     (all labs ordered are listed, but only abnormal results are displayed) Labs Reviewed  COMPREHENSIVE METABOLIC PANEL WITH GFR - Abnormal; Notable for the following components:      Result Value   Sodium 126 (*)    Potassium 3.0 (*)    Chloride 94 (*)    CO2 19 (*)    Glucose, Bld 147 (*)    All other components within normal limits  TSH - Abnormal; Notable for the following components:   TSH 5.936 (*)    All other components within normal limits  I-STAT CHEM 8, ED - Abnormal; Notable for the following components:   Sodium 129 (*)    Potassium 3.1 (*)    Chloride 96 (*)    BUN 7 (*)    Glucose, Bld 149 (*)    Calcium , Ion 1.12 (*)    TCO2 20 (*)    All other components within normal limits  CBG MONITORING, ED - Abnormal; Notable for the following components:   Glucose-Capillary 148 (*)    All other components within normal limits  ETHANOL  PROTIME-INR  APTT  CBC  DIFFERENTIAL  T4, FREE  RAPID URINE DRUG SCREEN, HOSP PERFORMED    EKG: None  Radiology: CT ANGIO HEAD NECK W WO CM (CODE STROKE) Result Date: 02/25/2024 CLINICAL DATA:  Neuro deficit, acute, stroke suspected. EXAM: CT ANGIOGRAPHY HEAD AND NECK WITH AND WITHOUT CONTRAST TECHNIQUE: Multidetector CT imaging of the head and neck was performed using the standard protocol during bolus administration of intravenous contrast. Multiplanar CT image reconstructions and MIPs were obtained to evaluate the vascular anatomy. Carotid stenosis measurements (when applicable) are obtained utilizing NASCET criteria, using the distal internal carotid diameter as the denominator. RADIATION DOSE REDUCTION: This exam was performed according to the departmental dose-optimization program which includes automated exposure control, adjustment of the mA and/or kV according to patient size and/or use of iterative reconstruction technique. CONTRAST:  75mL OMNIPAQUE  IOHEXOL  350 MG/ML SOLN COMPARISON:  CT head from earlier today.   The FINDINGS: CTA NECK FINDINGS Aortic arch: Great vessel origins are patent without significant stenosis. Right carotid system: No evidence of dissection, stenosis (50% or greater), or occlusion. Left carotid system: No evidence of dissection, stenosis (50% or greater), or occlusion. Vertebral arteries: Moderate right vertebral origin stenosis. Left vertebral artery is patent without significant stenosis. Skeleton: No acute abnormality on limited assessment. Other neck: No acute abnormality on limited assessment Upper chest: Visualized lung apices are clear. Review of the MIP images confirms the above findings CTA HEAD FINDINGS Anterior circulation: Bilateral intracranial ICAs, MCAs, and ACAs are patent without proximal hemodynamically significant stenosis. No aneurysm identified. Posterior circulation: Bilateral intradural vertebral arteries, basilar artery and bilateral posterior cerebral arteries are patent. Moderate right P2 PCA stenosis. No aneurysm identified. Venous sinuses: As permitted by contrast timing, patent. Review of the MIP images confirms the above findings IMPRESSION: 1. No emergent large vessel occlusion. 2. Moderate right P2 PCA stenosis. 3. Moderate right vertebral artery origin stenosis. Electronically Signed  By: Gilmore GORMAN Molt M.D.   On: 02/25/2024 21:59   CT HEAD CODE STROKE WO CONTRAST Result Date: 02/25/2024 CLINICAL DATA:  Code stroke.  Neuro deficit, acute, stroke suspected EXAM: CT HEAD WITHOUT CONTRAST TECHNIQUE: Contiguous axial images were obtained from the base of the skull through the vertex without intravenous contrast. RADIATION DOSE REDUCTION: This exam was performed according to the departmental dose-optimization program which includes automated exposure control, adjustment of the mA and/or kV according to patient size and/or use of iterative reconstruction technique. COMPARISON:  MRI head February 09, 2024 and CT head June 25 25. FINDINGS: Brain: Remote left thalamic  infarct. Patchy white matter hypodensities are nonspecific but compatible with chronic microvascular ischemic disease. No evidence of acute large vascular territory infarct, acute hemorrhage, mass lesion, midline shift or hydrocephalus. Vascular: No hyperdense vessel. Skull: No acute fracture. Sinuses/Orbits: Clear sinuses.  No acute orbital findings. Other: No mastoid effusions. ASPECTS [facility] Stroke Program Early CT Score) Total score (0-10 with 10 being normal): 10. IMPRESSION: 1. No evidence of acute intracranial abnormality. ASPECTS is 10. 2. Chronic microvascular ischemic change and remote left thalamic infarct. Code stroke imaging results were communicated on 02/25/2024 at 9:42 pm to provider Dr. Lindzen via secure text paging. Electronically Signed   By: Gilmore GORMAN Molt M.D.   On: 02/25/2024 21:43     Procedures   Medications Ordered in the ED  iohexol  (OMNIPAQUE ) 350 MG/ML injection 75 mL (75 mLs Intravenous Contrast Given 02/25/24 2136)    Clinical Course as of 02/25/24 2320  Sat Feb 25, 2024  2302 Stable 77 YOF with AMS Aphasic/Altered LKW last night.  No LVO Likely stroke admission.  [CC]    Clinical Course User Index [CC] Jerral Meth, MD                                 Medical Decision Making Patient presents via EMS as a code stroke after being found unresponsive for most of the day subsequently developing left gaze preference.  Broad differential including cranial hemorrhage, stroke, encephalopathy which the patient has had previously, medication interaction.  Patient's case discussed with manage with our neurology team.  Amount and/or Complexity of Data Reviewed Independent Historian: EMS External Data Reviewed: notes.    Details: Imaging within the past 2 weeks Labs: ordered. Decision-making details documented in ED Course. Radiology: ordered and independent interpretation performed. Decision-making details documented in ED Course. ECG/medicine tests: ordered  and independent interpretation performed. Decision-making details documented in ED Course.  Risk Prescription drug management. Decision regarding hospitalization. Diagnosis or treatment significantly limited by social determinants of health.   11:24 PM Patient in similar condition as on arrival.  Head CT without hemorrhage, initial labs reassuring, patient awaiting repeat evaluation, neuroconsult. Patient in similar condition as on arrival.  Head CT without acute hemorrhage, initial labs reassuring.  Final diagnoses:  Weakness  Aphasia     Garrick Charleston, MD 02/25/24 2327
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
## 2024-02-26 ENCOUNTER — Inpatient Hospital Stay (HOSPITAL_COMMUNITY)

## 2024-02-26 DIAGNOSIS — Z79890 Hormone replacement therapy (postmenopausal): Secondary | ICD-10-CM | POA: Diagnosis not present

## 2024-02-26 DIAGNOSIS — Z91013 Allergy to seafood: Secondary | ICD-10-CM | POA: Diagnosis not present

## 2024-02-26 DIAGNOSIS — R569 Unspecified convulsions: Secondary | ICD-10-CM

## 2024-02-26 DIAGNOSIS — Z1152 Encounter for screening for COVID-19: Secondary | ICD-10-CM | POA: Diagnosis not present

## 2024-02-26 DIAGNOSIS — Z91040 Latex allergy status: Secondary | ICD-10-CM | POA: Diagnosis not present

## 2024-02-26 DIAGNOSIS — E038 Other specified hypothyroidism: Secondary | ICD-10-CM | POA: Diagnosis present

## 2024-02-26 DIAGNOSIS — F419 Anxiety disorder, unspecified: Secondary | ICD-10-CM | POA: Diagnosis not present

## 2024-02-26 DIAGNOSIS — Z8249 Family history of ischemic heart disease and other diseases of the circulatory system: Secondary | ICD-10-CM | POA: Diagnosis not present

## 2024-02-26 DIAGNOSIS — Z886 Allergy status to analgesic agent status: Secondary | ICD-10-CM | POA: Diagnosis not present

## 2024-02-26 DIAGNOSIS — N1831 Chronic kidney disease, stage 3a: Secondary | ICD-10-CM | POA: Diagnosis present

## 2024-02-26 DIAGNOSIS — G9341 Metabolic encephalopathy: Secondary | ICD-10-CM | POA: Diagnosis present

## 2024-02-26 DIAGNOSIS — E871 Hypo-osmolality and hyponatremia: Secondary | ICD-10-CM | POA: Diagnosis present

## 2024-02-26 DIAGNOSIS — R4182 Altered mental status, unspecified: Secondary | ICD-10-CM

## 2024-02-26 DIAGNOSIS — Z85828 Personal history of other malignant neoplasm of skin: Secondary | ICD-10-CM | POA: Diagnosis not present

## 2024-02-26 DIAGNOSIS — F02818 Dementia in other diseases classified elsewhere, unspecified severity, with other behavioral disturbance: Secondary | ICD-10-CM | POA: Diagnosis present

## 2024-02-26 DIAGNOSIS — I160 Hypertensive urgency: Secondary | ICD-10-CM | POA: Diagnosis present

## 2024-02-26 DIAGNOSIS — F32A Depression, unspecified: Secondary | ICD-10-CM | POA: Diagnosis present

## 2024-02-26 DIAGNOSIS — I2510 Atherosclerotic heart disease of native coronary artery without angina pectoris: Secondary | ICD-10-CM | POA: Diagnosis present

## 2024-02-26 DIAGNOSIS — N179 Acute kidney failure, unspecified: Secondary | ICD-10-CM | POA: Diagnosis not present

## 2024-02-26 DIAGNOSIS — I129 Hypertensive chronic kidney disease with stage 1 through stage 4 chronic kidney disease, or unspecified chronic kidney disease: Secondary | ICD-10-CM | POA: Diagnosis present

## 2024-02-26 DIAGNOSIS — F0284 Dementia in other diseases classified elsewhere, unspecified severity, with anxiety: Secondary | ICD-10-CM | POA: Diagnosis present

## 2024-02-26 DIAGNOSIS — R4189 Other symptoms and signs involving cognitive functions and awareness: Secondary | ICD-10-CM | POA: Diagnosis not present

## 2024-02-26 DIAGNOSIS — R4701 Aphasia: Secondary | ICD-10-CM | POA: Diagnosis present

## 2024-02-26 DIAGNOSIS — F0282 Dementia in other diseases classified elsewhere, unspecified severity, with psychotic disturbance: Secondary | ICD-10-CM | POA: Diagnosis present

## 2024-02-26 DIAGNOSIS — G3183 Dementia with Lewy bodies: Secondary | ICD-10-CM | POA: Diagnosis present

## 2024-02-26 DIAGNOSIS — E538 Deficiency of other specified B group vitamins: Secondary | ICD-10-CM | POA: Diagnosis present

## 2024-02-26 DIAGNOSIS — Z888 Allergy status to other drugs, medicaments and biological substances status: Secondary | ICD-10-CM | POA: Diagnosis not present

## 2024-02-26 DIAGNOSIS — I6629 Occlusion and stenosis of unspecified posterior cerebral artery: Secondary | ICD-10-CM | POA: Diagnosis present

## 2024-02-26 LAB — PROCALCITONIN: Procalcitonin: <0.1 ng/mL

## 2024-02-26 LAB — MAGNESIUM: Magnesium: 1.9 mg/dL (ref 1.7–2.4)

## 2024-02-26 LAB — RAPID URINE DRUG SCREEN, HOSP PERFORMED
Amphetamines: NOT DETECTED
Barbiturates: NOT DETECTED
Benzodiazepines: NOT DETECTED
Cocaine: NOT DETECTED
Opiates: NOT DETECTED
Tetrahydrocannabinol: NOT DETECTED

## 2024-02-26 LAB — CK: Total CK: 104 U/L (ref 38–234)

## 2024-02-26 MED ORDER — IRBESARTAN 300 MG PO TABS
150.0000 mg | ORAL_TABLET | Freq: Every day | ORAL | Status: DC
  Administered 2024-02-26: 150 mg via ORAL
  Filled 2024-02-26 (×2): qty 1

## 2024-02-26 MED ORDER — ENOXAPARIN SODIUM 30 MG/0.3ML IJ SOSY
30.0000 mg | PREFILLED_SYRINGE | Freq: Every day | INTRAMUSCULAR | Status: DC
  Administered 2024-02-26 – 2024-03-02 (×4): 30 mg via SUBCUTANEOUS
  Filled 2024-02-26 (×5): qty 0.3

## 2024-02-26 MED ORDER — SODIUM CHLORIDE 0.9 % IV BOLUS
500.0000 mL | Freq: Once | INTRAVENOUS | Status: AC
  Administered 2024-02-26: 500 mL via INTRAVENOUS

## 2024-02-26 MED ORDER — POTASSIUM CHLORIDE CRYS ER 20 MEQ PO TBCR
40.0000 meq | EXTENDED_RELEASE_TABLET | Freq: Three times a day (TID) | ORAL | Status: AC
  Filled 2024-02-26 (×3): qty 2

## 2024-02-26 MED ORDER — POTASSIUM CHLORIDE 10 MEQ/100ML IV SOLN
10.0000 meq | INTRAVENOUS | Status: AC
  Administered 2024-02-26 (×2): 10 meq via INTRAVENOUS
  Filled 2024-02-26 (×2): qty 100

## 2024-02-26 MED ORDER — POTASSIUM CHLORIDE CRYS ER 20 MEQ PO TBCR
40.0000 meq | EXTENDED_RELEASE_TABLET | Freq: Once | ORAL | Status: DC
  Filled 2024-02-26: qty 2

## 2024-02-26 MED ORDER — POTASSIUM CHLORIDE 20 MEQ PO PACK
40.0000 meq | PACK | Freq: Two times a day (BID) | ORAL | Status: DC
  Administered 2024-02-26 (×2): 40 meq via ORAL
  Filled 2024-02-26 (×2): qty 2

## 2024-02-26 MED ORDER — PANTOPRAZOLE SODIUM 40 MG PO TBEC
40.0000 mg | DELAYED_RELEASE_TABLET | Freq: Every day | ORAL | Status: DC
  Administered 2024-02-26 – 2024-03-02 (×4): 40 mg via ORAL
  Filled 2024-02-26 (×6): qty 1

## 2024-02-26 MED ORDER — LEVOTHYROXINE SODIUM 25 MCG PO TABS
25.0000 ug | ORAL_TABLET | Freq: Every day | ORAL | Status: DC
  Administered 2024-02-26 – 2024-03-01 (×3): 25 ug via ORAL
  Filled 2024-02-26 (×5): qty 1

## 2024-02-26 MED ORDER — SODIUM CHLORIDE 0.9 % IV SOLN: INTRAVENOUS | Status: AC

## 2024-02-26 MED ORDER — CITALOPRAM HYDROBROMIDE 10 MG PO TABS: 10.0000 mg | ORAL_TABLET | Freq: Every day | ORAL | Status: DC

## 2024-02-26 NOTE — ED Notes (Signed)
 CCMD called.

## 2024-02-26 NOTE — Progress Notes (Signed)
 EEG complete - results pending

## 2024-02-26 NOTE — ED Notes (Addendum)
 Unable to obtain vitals from patient. Patient swatting and saying no no Family member concerned and stating they usually give her ativan  when she is like this. I've been waiting on the doctor for hours to come back and talk to me. Have contacted the provider
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
## 2024-02-26 NOTE — ED Notes (Signed)
 EEG at bedside.

## 2024-02-26 NOTE — ED Notes (Signed)
 Extra Lav & SST drawn

## 2024-02-26 NOTE — ED Notes (Signed)
 Phleb to obtain labs

## 2024-02-26 NOTE — ED Notes (Signed)
 For results please call husband with home phone listed in chart

## 2024-02-26 NOTE — ED Notes (Signed)
 Pt's Husband would like an update, number is in the chart under Lupita. RN notified

## 2024-02-26 NOTE — ED Notes (Signed)
 Phleb at bedside

## 2024-02-26 NOTE — ED Notes (Signed)
 Refusing temp check

## 2024-02-26 NOTE — ED Notes (Signed)
 Pt changed and cleaned. Incontinent of bladder

## 2024-02-26 NOTE — ED Notes (Signed)
 Patient appearing very anxious with this RN and hiding face under blanket. When this RN asked patient about how she was feeling, patient states I just don't feel good. Denies pain, GCS 15.

## 2024-02-26 NOTE — ED Notes (Signed)
 Awaiting verification for celexa 
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
## 2024-02-26 NOTE — Procedures (Signed)
 Patient Name: Karla Richards  MRN: [id number]  Epilepsy Attending: Arlin MALVA Krebs  Referring Physician/Provider: Merrianne Locus, MD  Date:  02/26/2024 Duration: 25.06 mins  Patient history: 78yo F with ams. EEG to evaluate for seizure.  Level of alertness: Awake  AEDs during EEG study: None  Technical aspects: This EEG study was done with scalp electrodes positioned according to the 10-20 International system of electrode placement. Electrical activity was reviewed with band pass filter of 1-70Hz , sensitivity of 7 uV/mm, display speed of 2mm/sec with a 60Hz  notched filter applied as appropriate. EEG data were recorded continuously and digitally stored.  Video monitoring was available and reviewed as appropriate.  Description: The posterior dominant rhythm consists of 9-10 Hz activity of moderate voltage (25-35 uV) seen predominantly in posterior head regions, symmetric and reactive to eye opening and eye closing. There is also 13 to 15 Hz beta activity  distributed symmetrically and diffusely. Hyperventilation and photic stimulation were not performed.     IMPRESSION: This study is within normal limits. No seizures or epileptiform discharges were seen throughout the recording.  A normal interictal EEG does not exclude the diagnosis of epilepsy.   Arta Stump O Donivin Wirt

## 2024-02-26 NOTE — ED Notes (Signed)
 Pt awake and alert now , moving all extremities ,

## 2024-02-27 ENCOUNTER — Other Ambulatory Visit: Payer: Self-pay

## 2024-02-27 DIAGNOSIS — G9341 Metabolic encephalopathy: Secondary | ICD-10-CM | POA: Diagnosis not present

## 2024-02-27 DIAGNOSIS — R4189 Other symptoms and signs involving cognitive functions and awareness: Secondary | ICD-10-CM | POA: Diagnosis not present

## 2024-02-27 DIAGNOSIS — F419 Anxiety disorder, unspecified: Secondary | ICD-10-CM

## 2024-02-27 LAB — BASIC METABOLIC PANEL WITH GFR
Anion gap: 5 (ref 5–15)
BUN: 15 mg/dL (ref 8–23)
CO2: 21 mmol/L — ABNORMAL LOW (ref 22–32)
Calcium: 8.7 mg/dL — ABNORMAL LOW (ref 8.9–10.3)
Chloride: 107 mmol/L (ref 98–111)
Creatinine, Ser: 1.23 mg/dL — ABNORMAL HIGH (ref 0.44–1.00)
GFR, Estimated: 45 mL/min — ABNORMAL LOW (ref >60)
Glucose, Bld: 93 mg/dL (ref 70–99)
Potassium: 4.7 mmol/L (ref 3.5–5.1)
Sodium: 133 mmol/L — ABNORMAL LOW (ref 135–145)

## 2024-02-27 LAB — VITAMIN B12: Vitamin B-12: 142 pg/mL — ABNORMAL LOW (ref 180–914)

## 2024-02-27 MED ORDER — HYDRALAZINE HCL 50 MG PO TABS
100.0000 mg | ORAL_TABLET | Freq: Every day | ORAL | Status: DC
  Administered 2024-02-27: 100 mg via ORAL
  Filled 2024-02-27 (×2): qty 2

## 2024-02-27 MED ORDER — HALOPERIDOL LACTATE 5 MG/ML IJ SOLN: 5.0000 mg | Freq: Once | INTRAMUSCULAR | Status: DC | PRN

## 2024-02-27 MED ORDER — HYDRALAZINE HCL 50 MG PO TABS: 50.0000 mg | ORAL_TABLET | Freq: Two times a day (BID) | ORAL | Status: DC

## 2024-02-27 MED ORDER — HALOPERIDOL LACTATE 5 MG/ML IJ SOLN: 5.0000 mg | INTRAMUSCULAR | Status: DC

## 2024-02-27 MED ORDER — HYDRALAZINE HCL 50 MG PO TABS: 50.0000 mg | ORAL_TABLET | Freq: Three times a day (TID) | ORAL | Status: DC

## 2024-02-27 MED ORDER — LORAZEPAM 1 MG PO TABS: 1.0000 mg | ORAL_TABLET | ORAL | Status: DC | PRN

## 2024-02-27 MED ORDER — SODIUM CHLORIDE 0.9 % IV SOLN: INTRAVENOUS | Status: DC

## 2024-02-27 MED ORDER — LORAZEPAM 1 MG PO TABS
1.0000 mg | ORAL_TABLET | ORAL | Status: DC | PRN
  Filled 2024-02-27 (×2): qty 1

## 2024-02-27 MED ORDER — LORAZEPAM 2 MG/ML IJ SOLN: 2.0000 mg | INTRAMUSCULAR | Status: DC | PRN

## 2024-02-27 MED ORDER — HYDRALAZINE HCL 20 MG/ML IJ SOLN
20.0000 mg | INTRAMUSCULAR | Status: DC | PRN
  Administered 2024-02-27 – 2024-02-28 (×2): 20 mg via INTRAVENOUS
  Filled 2024-02-27 (×2): qty 1

## 2024-02-27 MED ORDER — HYDROXYZINE HCL 25 MG PO TABS: 25.0000 mg | ORAL_TABLET | Freq: Three times a day (TID) | ORAL | Status: DC | PRN

## 2024-02-27 MED ORDER — HALOPERIDOL 5 MG PO TABS: 5.0000 mg | ORAL_TABLET | Freq: Four times a day (QID) | ORAL | Status: DC | PRN

## 2024-02-27 MED ORDER — LORAZEPAM 2 MG/ML IJ SOLN
1.0000 mg | INTRAMUSCULAR | Status: DC | PRN
  Administered 2024-02-27 – 2024-02-28 (×2): 1 mg via INTRAVENOUS
  Filled 2024-02-27 (×2): qty 1

## 2024-02-27 MED ORDER — LORAZEPAM 2 MG/ML IJ SOLN: 1.0000 mg | INTRAMUSCULAR | Status: DC | PRN

## 2024-02-27 MED ORDER — OLANZAPINE 5 MG PO TABS
2.5000 mg | ORAL_TABLET | Freq: Every day | ORAL | Status: DC
  Administered 2024-02-27 – 2024-02-28 (×2): 2.5 mg via ORAL
  Filled 2024-02-27 (×2): qty 1

## 2024-02-27 MED ORDER — HALOPERIDOL LACTATE 5 MG/ML IJ SOLN: 5.0000 mg | Freq: Four times a day (QID) | INTRAMUSCULAR | Status: DC | PRN

## 2024-02-27 NOTE — ED Notes (Signed)
 CCMD called.
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
## 2024-02-27 NOTE — Progress Notes (Signed)
 PROGRESS NOTE    Karla Richards  FMW:[id number]  DOB: 12/13/1945  DOA: 02/25/2024 PCP: Benjamine Aland, MD Outpatient Specialists:   Hospital course:   78 y.o. female with medical history significant of ?dementia, CAD, HTN, hypothyroidism, GERD, anxiety/panic attacks with several recent visits to ED with concern for altered mental status and stroke.  Extensive stroke workup has been negative.  Patient was brought in yesterday for decreased responsiveness/altered mental status, her husband was unable to arouse her.  Workup in ED was notable for hypertensive urgency, hyponatremia and initial decreased consciousness however woke up quickly after coming to the ED.  After she woke up however patient was noted to be extremely paranoid, noncooperative but no focal deficits.  Patient was seen by neurology and head imaging was unrevealing.  Psychiatry consultation was recommended.  Of note, pt was recently evaluated by her PCP who started her on citalopram  3 days ago. Husband denies any history of mental health issues, but noted that she was previously treated for anxiety/depression related to her last marriage. Additionally, during her last admission on 02/10/24 there was some concern for dementia and OP neurocognitive testing was recommended.  Per psychiatric note from 714: Called patient's daughter, Karla Richards ([phone]), who is a psychiatric nurse. When Karla saw the patient three weeks ago, she reports that she was alert, coherent, and seemed like herself. She also reports that Mr. Karla Richards reports that the patient had falls, urinary incontinence, sleep disturbances, bizarre behavior, incoherent/rambling speech, and paranoid thoughts since February.   Subjective:  Patient was seen in conjunction with her husband at bedside.  Patient was entirely alert and was walking around the room and climbed onto her gurney without difficulty.  She would not speak to me but would only point to  the door asking me to leave but repeatedly said to her husband  sorry for being disobedient.  Patient was uncooperative with nursing staff until her husband instructed her to follow nursing staff instructions.   Objective: Vitals:   02/26/24 2341 02/27/24 0421 02/27/24 1006 02/27/24 1148  BP: 128/74 134/79 (!) 211/95 (!) 194/85  Pulse: 62 65 66 73  Resp: 18 20 20    Temp:  98.2 F (36.8 C) 97.9 F (36.6 C)   TempSrc:  Oral Oral   SpO2: 99% 98% 100% 100%  Weight:       No intake or output data in the 24 hours ending 02/27/24 1627 Filed Weights   02/25/24 2100 02/25/24 2144  Weight: 42.6 kg 42.6 kg     Exam:  General: Patient walking back from bathroom, climbed on the gurney by herself, pointed at the door with intense expression clearly asking me to leave. Eyes: sclera anicteric, conjuctiva mild injection bilaterally CVS: S1-S2, regular  Respiratory: Normal effort GI: Soft, nontender LE: Warm and well-perfused Neuro: A/O x 3,  grossly nonfocal.  Psych: Patient with clear paranoia, repeatedly asking nursing staff if they knew she was Muslim, telling her husband that she was sorry for being disobedient multiple times but seemed to be reassured by presence of her husband.  Data Reviewed:  Basic Metabolic Panel: Recent Labs  Lab 02/25/24 2122 02/25/24 2131 02/27/24 0426  NA 126* 129* 133*  K 3.0* 3.1* 4.7  CL 94* 96* 107  CO2 19*  --  21*  GLUCOSE 147* 149* 93  BUN 9 7* 15  CREATININE 0.88 0.80 1.23*  CALCIUM  9.4  --  8.7*  MG  --  1.9  --     CBC:  Recent Labs  Lab 02/25/24 2122 02/25/24 2131  WBC 8.1  --   NEUTROABS 6.3  --   HGB 13.2 13.9  HCT 38.5 41.0  MCV 87.5  --   PLT 291  --      Scheduled Meds:  enoxaparin  (LOVENOX ) injection  30 mg Subcutaneous Daily   hydrALAZINE   100 mg Oral QHS   hydrALAZINE   50 mg Oral BID   irbesartan   150 mg Oral Daily   levothyroxine   25 mcg Oral QAC breakfast   OLANZapine   2.5 mg Oral QHS   pantoprazole   40 mg  Oral Daily   Continuous Infusions:   Assessment & Plan:   Altered mental status Metabolic encephalopathy vs dementia Paranoia/anxiety Very much appreciate both neurology and psychiatry input today Patient has had extensive neurowork-up at previous hospitalization, no further workup warranted per neurology notes. Per psychiatry, apparently there is a strong history of Lewy body dementia and history given by patient's daughter is notable for bizarre behavior since February at least. Patient on as needed Ativan  which she had responded well to at her last hospitalization Olanzapine  2.5 mg started per psychiatry today  Hyponatremia AKI Sodium improved appropriately with NS fluid resuscitation However creatinine has increased from 0.8-1.2 today, unclear etiology Continue NS at 100 cc an hour Recheck in the morning  Hypothyroidism Continue Synthroid   Hypertensive urgency HTN BP under excellent control with reinitiation of her BP meds Of note, patient frequently refuses to take oral meds As needed hydralazine  as ordered    DVT prophylaxis: Lovenox  Code Status: Full Family Communication: Patient's husband was at bedside throughout     Studies: EEG adult Result Date: 02/26/2024 Shelton Arlin KIDD, MD     02/26/2024 10:07 AM Patient Name: Karla Richards MRN: [id number] Epilepsy Attending: Arlin KIDD Shelton Referring Physician/Provider: Merrianne Locus, MD Date:  02/26/2024 Duration: 25.06 mins Patient history: 78yo F with ams. EEG to evaluate for seizure. Level of alertness: Awake AEDs during EEG study: None Technical aspects: This EEG study was done with scalp electrodes positioned according to the 10-20 International system of electrode placement. Electrical activity was reviewed with band pass filter of 1-70Hz , sensitivity of 7 uV/mm, display speed of 47mm/sec with a 60Hz  notched filter applied as appropriate. EEG data were recorded continuously and digitally stored.  Video  monitoring was available and reviewed as appropriate. Description: The posterior dominant rhythm consists of 9-10 Hz activity of moderate voltage (25-35 uV) seen predominantly in posterior head regions, symmetric and reactive to eye opening and eye closing. There is also 13 to 15 Hz beta activity  distributed symmetrically and diffusely. Hyperventilation and photic stimulation were not performed.   IMPRESSION: This study is within normal limits. No seizures or epileptiform discharges were seen throughout the recording. A normal interictal EEG does not exclude the diagnosis of epilepsy. Priyanka KIDD Shelton   CT ANGIO HEAD NECK W WO CM (CODE STROKE) Result Date: 02/25/2024 CLINICAL DATA:  Neuro deficit, acute, stroke suspected. EXAM: CT ANGIOGRAPHY HEAD AND NECK WITH AND WITHOUT CONTRAST TECHNIQUE: Multidetector CT imaging of the head and neck was performed using the standard protocol during bolus administration of intravenous contrast. Multiplanar CT image reconstructions and MIPs were obtained to evaluate the vascular anatomy. Carotid stenosis measurements (when applicable) are obtained utilizing NASCET criteria, using the distal internal carotid diameter as the denominator. RADIATION DOSE REDUCTION: This exam was performed according to the departmental dose-optimization program which includes automated exposure control, adjustment of the mA and/or kV according to patient size  and/or use of iterative reconstruction technique. CONTRAST:  75mL OMNIPAQUE  IOHEXOL  350 MG/ML SOLN COMPARISON:  CT head from earlier today.  The FINDINGS: CTA NECK FINDINGS Aortic arch: Great vessel origins are patent without significant stenosis. Right carotid system: No evidence of dissection, stenosis (50% or greater), or occlusion. Left carotid system: No evidence of dissection, stenosis (50% or greater), or occlusion. Vertebral arteries: Moderate right vertebral origin stenosis. Left vertebral artery is patent without significant  stenosis. Skeleton: No acute abnormality on limited assessment. Other neck: No acute abnormality on limited assessment Upper chest: Visualized lung apices are clear. Review of the MIP images confirms the above findings CTA HEAD FINDINGS Anterior circulation: Bilateral intracranial ICAs, MCAs, and ACAs are patent without proximal hemodynamically significant stenosis. No aneurysm identified. Posterior circulation: Bilateral intradural vertebral arteries, basilar artery and bilateral posterior cerebral arteries are patent. Moderate right P2 PCA stenosis. No aneurysm identified. Venous sinuses: As permitted by contrast timing, patent. Review of the MIP images confirms the above findings IMPRESSION: 1. No emergent large vessel occlusion. 2. Moderate right P2 PCA stenosis. 3. Moderate right vertebral artery origin stenosis. Electronically Signed   By: Gilmore GORMAN Molt M.D.   On: 02/25/2024 21:59   CT HEAD CODE STROKE WO CONTRAST Result Date: 02/25/2024 CLINICAL DATA:  Code stroke.  Neuro deficit, acute, stroke suspected EXAM: CT HEAD WITHOUT CONTRAST TECHNIQUE: Contiguous axial images were obtained from the base of the skull through the vertex without intravenous contrast. RADIATION DOSE REDUCTION: This exam was performed according to the departmental dose-optimization program which includes automated exposure control, adjustment of the mA and/or kV according to patient size and/or use of iterative reconstruction technique. COMPARISON:  MRI head February 09, 2024 and CT head June 25 25. FINDINGS: Brain: Remote left thalamic infarct. Patchy white matter hypodensities are nonspecific but compatible with chronic microvascular ischemic disease. No evidence of acute large vascular territory infarct, acute hemorrhage, mass lesion, midline shift or hydrocephalus. Vascular: No hyperdense vessel. Skull: No acute fracture. Sinuses/Orbits: Clear sinuses.  No acute orbital findings. Other: No mastoid effusions. ASPECTS [facility]  Stroke Program Early CT Score) Total score (0-10 with 10 being normal): 10. IMPRESSION: 1. No evidence of acute intracranial abnormality. ASPECTS is 10. 2. Chronic microvascular ischemic change and remote left thalamic infarct. Code stroke imaging results were communicated on 02/25/2024 at 9:42 pm to provider Dr. Lindzen via secure text paging. Electronically Signed   By: Gilmore GORMAN Molt M.D.   On: 02/25/2024 21:43    Principal Problem:   Acute metabolic encephalopathy     Ivan Vangie Pike, [facility]  If 7PM-7AM, please contact night-coverage [email or web address]   LOS: 1 day

## 2024-02-27 NOTE — Plan of Care (Signed)

## 2024-02-27 NOTE — Hospital Course (Addendum)
 Haldol  5mg  x 1 now,may repeat times 1 Then q 6 hrs x 8 doses EKG with Qtc 3i9, K wnl after repletion Psych consult placed   Refusing oral meds Prn hydralazine 

## 2024-02-27 NOTE — ED Notes (Addendum)
 Karla Richards

## 2024-02-27 NOTE — Progress Notes (Addendum)
 NEUROLOGY CONSULT FOLLOW UP NOTE   Date of service: February 27, 2024 Patient Name: Karla Richards MRN:  [id number] DOB:  March 18, 1946  Interval Hx/subjective  Patient has been afebrile and hemodynamically stable overnight.  She is noted to be quite anxious on exam this morning.  Of note, she has had several similar presentations with anxiety and hyperventilation over the last few months. Vitals   Vitals:   02/26/24 1715 02/26/24 2340 02/26/24 2341 02/27/24 0421  BP: (!) 143/95  128/74 134/79  Pulse: 68  62 65  Resp: 20  18 20   Temp:  98.3 F (36.8 C)  98.2 F (36.8 C)  TempSrc:  Oral  Oral  SpO2: 97%  99% 98%  Weight:         Body mass index is 19.29 kg/m.  Physical Exam   Constitutional: Well-developed, well-nourished elderly patient, appears acutely anxious Psych: Very anxious appearing Eyes: No scleral injection.  HENT: No OP obstrucion.  Head: Normocephalic.  Cardiovascular: Normal rate and regular rhythm, not tachycardic.  Respiratory: Effort normal, non-labored breathing.  Skin: WDI.   Neurologic Examination    NEURO:  Mental Status: Patient is alert and appears anxious, covering her face with a sheet.  She will answer some questions but not others.  When asked the month, she holds up 7 fingers and does not state where she is.  She is able to follow single step commands but exam is limited due to anxiety and cooperation Speech/Language: speech is pressured and short phrases  Cranial Nerves:  II: PERRL.  III, IV, VI: EOMI. Eyelids elevate symmetrically.  VII: Face is symmetric resting and smiling VIII: hearing intact to voice. IX, X: Phonation is normal.  Motor: Able to move all 4 extremities symmetrically with antigravity strength Tone: is normal and bulk is normal Sensation- Intact to light touch bilaterally.  Gait- deferred    Medications  Current Facility-Administered Medications:    0.9 %  sodium chloride  infusion, , Intravenous, Continuous,  Georgina Basket, MD, Last Rate: 100 mL/hr at 02/26/24 1437, New Bag at 02/26/24 1437   enoxaparin  (LOVENOX ) injection 30 mg, 30 mg, Subcutaneous, Daily, Georgina Basket, MD, 30 mg at 02/26/24 9060   irbesartan  (AVAPRO ) tablet 150 mg, 150 mg, Oral, Daily, Georgina Basket, MD, 150 mg at 02/26/24 9050   levothyroxine  (SYNTHROID ) tablet 25 mcg, 25 mcg, Oral, QAC breakfast, Georgina Basket, MD, 25 mcg at 02/26/24 0758   pantoprazole  (PROTONIX ) EC tablet 40 mg, 40 mg, Oral, Daily, Georgina Basket, MD, 40 mg at 02/26/24 9063   potassium chloride  SA (KLOR-CON  M) CR tablet 40 mEq, 40 mEq, Oral, TID, Georgina Basket, MD  Current Outpatient Medications:    acetaminophen  (TYLENOL ) 500 MG tablet, Take 1,000 mg by mouth 2 (two) times daily as needed for headache, fever or moderate pain (pain score 4-6)., Disp: , Rfl:    Cholecalciferol (VITAMIN D -3 PO), Take 1 capsule by mouth daily., Disp: , Rfl:    citalopram  (CELEXA ) 10 MG tablet, Take 10 mg by mouth daily., Disp: , Rfl:    hydrALAZINE  (APRESOLINE ) 50 MG tablet, Take 50-100 mg by mouth See admin instructions. Take 1 tablet (50mg ) by mouth in the morning, take 1 tablet at noon and take 2 tablets (100mg ) in the evening., Disp: , Rfl:    levothyroxine  (SYNTHROID ) 25 MCG tablet, Take 25 mcg by mouth daily before breakfast., Disp: , Rfl:    pantoprazole  (PROTONIX ) 40 MG tablet, Take 1 tablet (40 mg total) by mouth daily. (Patient taking differently: Take  40 mg by mouth daily as needed (heart burn).), Disp: 30 tablet, Rfl: 1   tretinoin  (RETIN-A ) 0.05 % cream, Apply 1 application  topically at bedtime as needed (skin spots)., Disp: , Rfl:    valsartan  (DIOVAN ) 160 MG tablet, Take 160 mg by mouth daily., Disp: , Rfl:    cyanocobalamin  1000 MCG tablet, Take 1 tablet (1,000 mcg total) by mouth daily. (Patient not taking: Reported on 02/26/2024), Disp: , Rfl:    hydrOXYzine  (ATARAX ) 25 MG tablet, Take 0.5 tablets (12.5 mg total) by mouth every 8 (eight) hours as needed for anxiety.  (Patient not taking: Reported on 02/26/2024), Disp: 6 tablet, Rfl: 0   Teriparatide  (FORTEO ) 560 MCG/2.24ML SOPN, Inject 20 mcg into the skin daily. (Patient not taking: Reported on 02/26/2024), Disp: 2.24 mL, Rfl: 12  Facility-Administered Medications Ordered in Other Encounters:    mitoMYcin  (MUTAMYCIN ) Injection Use in OR only (0.4 mg/ml), 0.5 mL, Left Eye, Once, Cyrus Carwin, MD  Labs and Diagnostic Imaging   CBC:  Recent Labs  Lab 02/25/24 2122 02/25/24 2131  WBC 8.1  --   NEUTROABS 6.3  --   HGB 13.2 13.9  HCT 38.5 41.0  MCV 87.5  --   PLT 291  --     Basic Metabolic Panel:  Lab Results  Component Value Date   NA 133 (L) 02/27/2024   K 4.7 02/27/2024   CO2 21 (L) 02/27/2024   GLUCOSE 93 02/27/2024   BUN 15 02/27/2024   CREATININE 1.23 (H) 02/27/2024   CALCIUM  8.7 (L) 02/27/2024   GFRNONAA 45 (L) 02/27/2024   GFRAA 55 (L) 05/02/2019   Lipid Panel:  Lab Results  Component Value Date   LDLCALC 105 (H) 03/01/2021   HgbA1c:  Lab Results  Component Value Date   HGBA1C 5.4 03/01/2021   Urine Drug Screen:     Component Value Date/Time   LABOPIA NONE DETECTED 02/26/2024 0330   COCAINSCRNUR NONE DETECTED 02/26/2024 0330   LABBENZ NONE DETECTED 02/26/2024 0330   AMPHETMU NONE DETECTED 02/26/2024 0330   THCU NONE DETECTED 02/26/2024 0330   LABBARB NONE DETECTED 02/26/2024 0330    Alcohol Level     Component Value Date/Time   ETH <15 02/25/2024 2122   INR  Lab Results  Component Value Date   INR 1.0 02/25/2024   APTT  Lab Results  Component Value Date   APTT 32 02/25/2024   AED levels: No results found for: PHENYTOIN, ZONISAMIDE, LAMOTRIGINE, LEVETIRACETA  CT Head without contrast(Personally reviewed): No acute abnormality, chronic microvascular ischemic changes  CT angio Head and Neck with contrast(Personally reviewed): No LVO, moderate right P2 PCA stenosis, moderate right vertebral artery origin stenosis  rEEG:  This study is within  normal limits. No seizures or epileptiform discharges were seen throughout the recording.   Assessment   Karla Richards is a 78 y.o. female with history of CAD, hypertension, hypothyroidism, GERD, anxiety with panic attacks and possible dementia who presented with an episode of decreased responsiveness.  Of note, patient has had several presentations with acute anxiety and hyperventilation over the last 2 months.  On presentation yesterday, she had been noted by family to have been withdrawn for about 13 hours and was poorly responsive to external stimuli with a left gaze preference.  Initial head imaging was negative.  On later exam, she was noted to be acutely anxious.  This morning, she continues to be anxious and has noted to be covering her face with a sheet and staring about  the room, startling noises.  She asks if the examiner is Muslim (patient is Muslim) and states that her entire family line is in danger.  She is unable to elaborate on why this is, and cooperation with exam is limited due to anxiety.  Will start hydroxyzine  as needed for anxiety.  Vitamin B12 level was noted to be low in previous admission, so we will recheck this and see if supplementation has helped.  Recommendations  -Recommend psychiatry consult - Could give low-dose lorazepam  of 1 mg p.o. for acute anxiety as this has helped during prior ED visits, but would be cautious using this medication repeatedly in an elderly patient - Hydroxyzine  25 mg 3 times daily as needed for anxiety -Follow-up B12 level - Recommend spiritual care, preferably with Muslim chaplain ______________________________________________________________________ Patient seen by NP and then by MD, MD to edit note as needed.  Signed, Cortney E Everitt Clint Kill, NP [facility]    Attending Neurohospitalist Addendum Patient seen and examined with APP/Resident. Agree with the history and physical as documented above. Agree with the plan  as documented, which I helped formulate. I have edited the note above to reflect my full findings and recommendations. I have independently reviewed the chart, obtained history, review of systems and examined the patient.I have personally reviewed pertinent head/neck/spine imaging (CT/MRI). Please feel free to call with any questions.  Patient has been here multiple times in the past 2 months, admitted 3 weeks ago; over this time period she has had a full inpatient workup from a neurology standpoint. We have not identified any reversible causes that may be contributing to her presentation. She has previously seen Dr. Avi of [facility] and has a follow up appt scheduled in December for neurocognitive evaluation. Unfortunately we are not able to do an accurate assessment for underlying dementia while she is inpatient with acute exacerbation of her psychiatric symptoms and behaviors. I tried to reach her husband by phone and was unable to although I did speak with her daughter Milinda Spruce (mental health RN) by phone who is amenable to this plan. Appreciate psychiatry consult today. Neurology will not continue to actively follow but please reach out if any new neurologic concerns arise.  -- Elida Ross, MD [facility] [phone]  If 7pm- 7am, please page neurology on call as listed in AMION.

## 2024-02-27 NOTE — ED Notes (Signed)
 Pt screaming loudly from her bed. She is having paranoid delusions and her words make no sense. Pt is also refusing all meds. Notified attending via secure chat. Notified attending pt is going to need IM meds to calm down as she refuses to take anything PO

## 2024-02-28 DIAGNOSIS — G9341 Metabolic encephalopathy: Secondary | ICD-10-CM | POA: Diagnosis not present

## 2024-02-28 LAB — COMPREHENSIVE METABOLIC PANEL WITH GFR
ALT: 23 U/L (ref 0–44)
AST: 38 U/L (ref 15–41)
Albumin: 3.4 g/dL — ABNORMAL LOW (ref 3.5–5.0)
Alkaline Phosphatase: 46 U/L (ref 38–126)
Anion gap: 8 (ref 5–15)
BUN: 17 mg/dL (ref 8–23)
CO2: 23 mmol/L (ref 22–32)
Calcium: 9.4 mg/dL (ref 8.9–10.3)
Chloride: 104 mmol/L (ref 98–111)
Creatinine, Ser: 1.07 mg/dL — ABNORMAL HIGH (ref 0.44–1.00)
GFR, Estimated: 53 mL/min — ABNORMAL LOW (ref >60)
Glucose, Bld: 78 mg/dL (ref 70–99)
Potassium: 3.8 mmol/L (ref 3.5–5.1)
Sodium: 135 mmol/L (ref 135–145)
Total Bilirubin: 0.5 mg/dL (ref 0.0–1.2)
Total Protein: 6.3 g/dL — ABNORMAL LOW (ref 6.5–8.1)

## 2024-02-28 LAB — PHOSPHORUS: Phosphorus: 3.7 mg/dL (ref 2.5–4.6)

## 2024-02-28 LAB — CBC
HCT: 35.2 % — ABNORMAL LOW (ref 36.0–46.0)
Hemoglobin: 11.8 g/dL — ABNORMAL LOW (ref 12.0–15.0)
MCH: 30.3 pg (ref 26.0–34.0)
MCHC: 33.5 g/dL (ref 30.0–36.0)
MCV: 90.3 fL (ref 80.0–100.0)
Platelets: 209 K/uL (ref 150–400)
RBC: 3.9 MIL/uL (ref 3.87–5.11)
RDW: 14.3 % (ref 11.5–15.5)
WBC: 4.9 K/uL (ref 4.0–10.5)
nRBC: 0 % (ref 0.0–0.2)

## 2024-02-28 LAB — T4, FREE: Free T4: 0.95 ng/dL (ref 0.61–1.12)

## 2024-02-28 LAB — MAGNESIUM: Magnesium: 1.9 mg/dL (ref 1.7–2.4)

## 2024-02-28 LAB — TSH: TSH: 2.828 u[IU]/mL (ref 0.350–4.500)

## 2024-02-28 MED ORDER — HYDRALAZINE HCL 20 MG/ML IJ SOLN
10.0000 mg | Freq: Four times a day (QID) | INTRAMUSCULAR | Status: DC | PRN
  Administered 2024-02-29: 10 mg via INTRAVENOUS
  Filled 2024-02-28 (×2): qty 1

## 2024-02-28 MED ORDER — LOSARTAN POTASSIUM 50 MG PO TABS
50.0000 mg | ORAL_TABLET | Freq: Every day | ORAL | Status: DC
  Administered 2024-02-29 – 2024-03-02 (×3): 50 mg via ORAL
  Filled 2024-02-28 (×3): qty 1

## 2024-02-28 MED ORDER — CLONIDINE HCL 0.2 MG/24HR TD PTWK
0.2000 mg | MEDICATED_PATCH | TRANSDERMAL | Status: DC
  Filled 2024-02-28: qty 1

## 2024-02-28 MED ORDER — VITAMIN B-12 1000 MCG PO TABS
1000.0000 ug | ORAL_TABLET | Freq: Every day | ORAL | Status: DC
  Administered 2024-03-02: 1000 ug via ORAL
  Filled 2024-02-28: qty 1

## 2024-02-28 MED ORDER — IRBESARTAN 75 MG PO TABS: 75.0000 mg | ORAL_TABLET | Freq: Every day | ORAL | Status: DC

## 2024-02-28 MED ORDER — CYANOCOBALAMIN 1000 MCG/ML IJ SOLN
1000.0000 ug | Freq: Every day | INTRAMUSCULAR | Status: AC
  Administered 2024-02-28 – 2024-03-01 (×3): 1000 ug via SUBCUTANEOUS
  Filled 2024-02-28 (×3): qty 1

## 2024-02-28 MED ORDER — HYDRALAZINE HCL 50 MG PO TABS: 50.0000 mg | ORAL_TABLET | Freq: Three times a day (TID) | ORAL | Status: DC

## 2024-02-28 MED ORDER — HYDRALAZINE HCL 20 MG/ML IJ SOLN: 10.0000 mg | INTRAMUSCULAR | Status: DC | PRN

## 2024-02-28 NOTE — Discharge Instructions (Signed)
 From the psychiatry team:  Please follow-up with the following practices offering care for geriatric patients.  Elna Lo, geriatric psychiatrist [facility]: [phone] [place] [facility]: [phone]  [facility]: Mental health nurse practitioner care for geriatric patients [phone]  [facility]: Medical geriatric care [phone]     Follow with Primary MD Benjamine Aland, MD in 3 days follow-up with your psychiatrist within a week of discharge.  Get CBC, CMP  -  checked next visit with your primary M   Activity: As tolerated with Full fall precautions use walker/cane & assistance as needed  Disposition Home   Diet: Heart Healthy    Special Instructions: If you have smoked or chewed Tobacco  in the last 2 yrs please stop smoking, stop any regular Alcohol  and or any Recreational drug use.  On your next visit with your primary care physician please Get Medicines reviewed and adjusted.  Please request your Prim.MD to go over all Hospital Tests and Procedure/Radiological results at the follow up, please get all Hospital records sent to your Prim MD by signing hospital release before you go home.  If you experience worsening of your admission symptoms, develop shortness of breath, life threatening emergency, suicidal or homicidal thoughts you must seek medical attention immediately by calling 911 or calling your MD immediately  if symptoms less severe.  You Must read complete instructions/literature along with all the possible adverse reactions/side effects for all the Medicines you take and that have been prescribed to you. Take any new Medicines after you have completely understood and accpet all the possible adverse reactions/side effects.   Do not drive when taking Pain medications.  Do not take more than prescribed Pain, Sleep and Anxiety Medications  Wear Seat belts while driving.   Please note  You were cared for  by a hospitalist during your hospital stay. If you have any questions about your discharge medications or the care you received while you were in the hospital after you are discharged, you can call the unit and asked to speak with the hospitalist on call if the hospitalist that took care of you is not available. Once you are discharged, your primary care physician will handle any further medical issues. Please note that NO REFILLS for any discharge medications will be authorized once you are discharged, as it is imperative that you return to your primary care physician (or establish a relationship with a primary care physician if you do not have one) for your aftercare needs so that they can reassess your need for medications and monitor your lab values.  ----------------------------------------------------------------  [facility] OUTPATIENT Walk-in information:  Please note, all walk-ins are first come & first serve, with limited number of availability.  Therapist for therapy:  Monday & Wednesdays: Please ARRIVE at 7:00 AM for registration Will START at 8:00 AM Every 1st & 2nd Friday of the month: Please ARRIVE at 10:00 AM for registration Will START at 1 PM - 5 PM Psychiatrist for medication management: Monday - Friday:  Please ARRIVE at 7:00 AM for registration Will START at 8:00 AM    Regretfully, due to limited availability, please be aware that you may not been seen on the same day as walk-in. Please consider making an appoint or try again. Thank you for your patience and understanding.  [facility] [place] [address]  [place], [place] [address] [phone]  New patients are seen at their walk-in clinic. Walk-in hours are  Monday - Friday from 8:30 am - 12:00 pm, and from 1:00 pm - 2:30 pm.   Walk-in patients are seen on a first come, first served basis, so try to arrive as early as possible for the best chance of being seen the same  day.

## 2024-02-28 NOTE — Progress Notes (Signed)
 PROGRESS NOTE    Karla Richards  FMW:[id number]  DOB: [birth year]  DOA: 02/25/2024 PCP: Benjamine Aland, MD Outpatient Specialists:   Hospital course:   78 y.o. female with medical history significant of ?dementia, CAD, HTN, hypothyroidism, GERD, anxiety/panic attacks with several recent visits to ED with concern for altered mental status and stroke.  Extensive stroke workup has been negative.  Patient was brought in yesterday for decreased responsiveness/altered mental status, her husband was unable to arouse her.  Workup in ED was notable for hypertensive urgency, hyponatremia and initial decreased consciousness however woke up quickly after coming to the ED.  After she woke up however patient was noted to be extremely paranoid, noncooperative but no focal deficits.  Patient was seen by neurology and head imaging was unrevealing.  Psychiatry consultation was recommended.  Of note, pt was recently evaluated by her PCP who started her on citalopram  3 days ago. Husband denies any history of mental health issues, but noted that she was previously treated for anxiety/depression related to her last marriage. Additionally, during her last admission on 02/10/24 there was some concern for dementia and OP neurocognitive testing was recommended.  Per psychiatric note from 714: Called patient's daughter, Karla Richards ([phone]), who is a psychiatric nurse. When Karla saw the patient three weeks ago, she reports that she was alert, coherent, and seemed like herself. She also reports that Mr. Trudy reports that the patient had falls, urinary incontinence, sleep disturbances, bizarre behavior, incoherent/rambling speech, and paranoid thoughts since February.   Subjective:  Patient was seen in no distress but does not want to talk  Objective: Vitals:   02/27/24 2033 02/28/24 0000 02/28/24 0400 02/28/24 0756  BP: 127/67 98/60 (!) 115/58 (!) 192/86  Pulse:  67 (!) 52 61  Resp:  16 14 (!)  21  Temp:    97.6 F (36.4 C)  TempSrc:    Axillary  SpO2:  96% 95% 98%  Weight:      Height:        Intake/Output Summary (Last 24 hours) at 02/28/2024 0825 Last data filed at 02/27/2024 2100 Gross per 24 hour  Intake 331.98 ml  Output --  Net 331.98 ml   Filed Weights   02/25/24 2100 02/25/24 2144 02/27/24 1148  Weight: 42.6 kg 42.6 kg 42.6 kg     Exam:  Awake Alert, No new F.N deficits, anxious affect, refuses to talk or answer questions [place].AT,PERRAL Supple Neck, No JVD,   Symmetrical Chest wall movement, Good air movement bilaterally, CTAB RRR,No Gallops, Rubs or new Murmurs,  +ve B.Sounds, Abd Soft, No tenderness,   No Cyanosis, Clubbing or edema     Assessment & Plan:   Altered mental status Metabolic encephalopathy vs dementia Paranoia/anxiety Very much appreciate both neurology and psychiatry input today Patient has had extensive neurowork-up at previous hospitalization, no further workup warranted per neurology notes. Per psychiatry, apparently there is a strong history of Lewy body dementia and history given by patient's daughter is notable for bizarre behavior since February at least.  She has been placed on scheduled nighttime Zyprexa  along with as needed Ativan , discussed the plan with patient's husband in detail on 02/28/2024 and also communicated his concerns to the psych team. Interestingly patient's B12 is exceptionally low, question if longstanding B12 deficiency is contributing to some cognitive decline,  Hyponatremia AKI Proved with hydration  Hypothyroidism Continue Synthroid   Hypertensive urgency HTN Pressure was high upon admission, patient on blood pressure medications, few blood pressure readings are erratic  will monitor closely and adjust as needed  Vitamin B12 deficiency.  Placed on replacement.    DVT prophylaxis: Lovenox  Code Status: Full Family Communication: Husband Lupita [phone]  over the phone on 02/28/2024    Data  Review:   Inpatient Medications  Scheduled Meds:  cyanocobalamin   1,000 mcg Subcutaneous Daily   [START ON 03/02/2024] vitamin B-12  1,000 mcg Oral Daily   enoxaparin  (LOVENOX ) injection  30 mg Subcutaneous Daily   hydrALAZINE   50 mg Oral Q8H   [START ON 02/29/2024] irbesartan   75 mg Oral Daily   levothyroxine   25 mcg Oral QAC breakfast   OLANZapine   2.5 mg Oral QHS   pantoprazole   40 mg Oral Daily   Continuous Infusions:  sodium chloride  100 mL/hr at 02/27/24 1704   PRN Meds:.hydrALAZINE , LORazepam  **OR** LORazepam   DVT Prophylaxis  enoxaparin  (LOVENOX ) injection 30 mg Start: 02/26/24 1000       Recent Labs  Lab 02/25/24 2122 02/25/24 2131 02/28/24 0550  WBC 8.1  --  4.9  HGB 13.2 13.9 11.8*  HCT 38.5 41.0 35.2*  PLT 291  --  209  MCV 87.5  --  90.3  MCH 30.0  --  30.3  MCHC 34.3  --  33.5  RDW 13.8  --  14.3  LYMPHSABS 0.9  --   --   MONOABS 0.8  --   --   EOSABS 0.0  --   --   BASOSABS 0.0  --   --     Recent Labs  Lab 02/25/24 2122 02/25/24 2131 02/26/24 1350 02/27/24 0426 02/28/24 0550  NA 126* 129*  --  133* 135  K 3.0* 3.1*  --  4.7 3.8  CL 94* 96*  --  107 104  CO2 19*  --   --  21* 23  ANIONGAP 13  --   --  5 8  GLUCOSE 147* 149*  --  93 78  BUN 9 7*  --  15 17  CREATININE 0.88 0.80  --  1.23* 1.07*  AST 31  --   --   --  38  ALT 16  --   --   --  23  ALKPHOS 60  --   --   --  46  BILITOT 0.8  --   --   --  0.5  ALBUMIN 4.0  --   --   --  3.4*  PROCALCITON  --   --  <0.10  --   --   INR 1.0  --   --   --   --   TSH  --  5.936*  --   --   --   MG  --  1.9  --   --  1.9  PHOS  --   --   --   --  3.7  CALCIUM  9.4  --   --  8.7* 9.4      Recent Labs  Lab 02/25/24 2122 02/25/24 2131 02/26/24 1350 02/27/24 0426 02/28/24 0550  PROCALCITON  --   --  <0.10  --   --   INR 1.0  --   --   --   --   TSH  --  5.936*  --   --   --   MG  --  1.9  --   --  1.9  CALCIUM  9.4  --   --  8.7* 9.4     --------------------------------------------------------------------------------------------------------------- Lab Results  Component Value Date   CHOL  184 03/01/2021   HDL 64 03/01/2021   LDLCALC 105 (H) 03/01/2021   TRIG 77 03/01/2021   CHOLHDL 2.9 03/01/2021    Lab Results  Component Value Date   HGBA1C 5.4 03/01/2021   Recent Labs    02/25/24 2122 02/25/24 2131  TSH  --  5.936*  FREET4 1.11  --    Recent Labs    02/27/24 1210  VITAMINB12 142*   ------------------------------------------------------------------------------------------------------------------ Cardiac Enzymes No results for input(s): CKMB, TROPONINI, MYOGLOBIN in the last 168 hours.  Invalid input(s): CK  Micro Results No results found for this or any previous visit (from the past 240 hours).  Radiology Reports  EEG adult Result Date: 02/26/2024 Shelton Arlin KIDD, MD     02/26/2024 10:07 AM Patient Name: Kalynne Womac MRN: [id number] Epilepsy Attending: Arlin KIDD Shelton Referring Physician/Provider: Merrianne Locus, MD Date:  02/26/2024 Duration: 25.06 mins Patient history: 78yo F with ams. EEG to evaluate for seizure. Level of alertness: Awake AEDs during EEG study: None Technical aspects: This EEG study was done with scalp electrodes positioned according to the 10-20 International system of electrode placement. Electrical activity was reviewed with band pass filter of 1-70Hz , sensitivity of 7 uV/mm, display speed of 72mm/sec with a 60Hz  notched filter applied as appropriate. EEG data were recorded continuously and digitally stored.  Video monitoring was available and reviewed as appropriate. Description: The posterior dominant rhythm consists of 9-10 Hz activity of moderate voltage (25-35 uV) seen predominantly in posterior head regions, symmetric and reactive to eye opening and eye closing. There is also 13 to 15 Hz beta activity  distributed symmetrically and diffusely. Hyperventilation  and photic stimulation were not performed.   IMPRESSION: This study is within normal limits. No seizures or epileptiform discharges were seen throughout the recording. A normal interictal EEG does not exclude the diagnosis of epilepsy. Priyanka O Yadav      Signature  -   Lavada Stank M.D on 02/28/2024 at 8:28 AM   -  To page go to [email or web address]
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
## 2024-02-28 NOTE — Plan of Care (Signed)
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
## 2024-02-28 NOTE — Evaluation (Signed)
 Occupational Therapy Evaluation Patient Details Name: Karla Richards MRN: [id number] DOB: 15-Aug-1946 Today's Date: 02/28/2024   History of Present Illness   Pt is a 78 y/o F admitted on 02/25/24 after presenting with decreased responsiveness/AMS. In ED, pt initially found to have hypertensive urgency, hyponatremia. Pt is being treated for metabolic encephalopathy vs dementia, paranoia/anxiety. PMH: ?dementia, CAD, HTN, hypothyroidism, GERD, anxiety/panic attacks     Clinical Impressions Pt lethargic, flat affect, responds to questions and commands inconsistently. Pt poor historian, info gathered from prior admission, lives with husband, 7+7 STE, PLOF per chart has been independent in recent past. Pt currently max A for dressing/bathing, max A to STS at EOB not able to take steps. Pt has fair strength but due to poor cognition has difficulty sequencing and participating in ADLs. At this time recommending postacute rehab <3hrs/day to improve with functional mobility and ADL participation, will continue to see acutely to progress as able.      If plan is discharge home, recommend the following:   Two people to help with walking and/or transfers;A lot of help with bathing/dressing/bathroom;Assistance with cooking/housework;Help with stairs or ramp for entrance;Assist for transportation;Supervision due to cognitive status;Assistance with feeding     Functional Status Assessment   Patient has had a recent decline in their functional status and demonstrates the ability to make significant improvements in function in a reasonable and predictable amount of time.     Equipment Recommendations   Other (comment) (defer)     Recommendations for Other Services         Precautions/Restrictions   Precautions Precautions: Fall Restrictions Weight Bearing Restrictions Per Provider Order: No     Mobility Bed Mobility Overal bed mobility: Needs Assistance Bed Mobility: Supine  to Sit, Sit to Supine     Supine to sit: Max assist Sit to supine: Max assist   General bed mobility comments: max A in/out of bed, able to sit EOB with CGA-mod A support.    Transfers Overall transfer level: Needs assistance Equipment used: Rolling walker (2 wheels) Transfers: Sit to/from Stand Sit to Stand: Max assist           General transfer comment: max A for STS, two attempts, able to take very small side step with max A.      Balance Overall balance assessment: Needs assistance Sitting-balance support: No upper extremity supported, Feet supported Sitting balance-Leahy Scale: Poor Sitting balance - Comments: CGA to mod A for sitting EOB, poor overall balance with R lateral lean forward lean   Standing balance support: Bilateral upper extremity supported, During functional activity, Reliant on assistive device for balance Standing balance-Leahy Scale: Poor Standing balance comment: max A with RW and therapist support to stand                           ADL either performed or assessed with clinical judgement   ADL Overall ADL's : Needs assistance/impaired Eating/Feeding: Moderate assistance   Grooming: Moderate assistance   Upper Body Bathing: Maximal assistance;Sitting   Lower Body Bathing: Maximal assistance;Sitting/lateral leans;Sit to/from stand   Upper Body Dressing : Maximal assistance;Sitting   Lower Body Dressing: Maximal assistance;Sitting/lateral leans;Sit to/from stand       Toileting- Architect and Hygiene: Total assistance         General ADL Comments: Pt max A for dressing/bathing, able to stand with max A at bedside, not able to pivot or take steps without significant assistance. Poor use  of BUEs limiting participation with ADLs     Vision Baseline Vision/History: 1 Wears glasses;3 Glaucoma;6 Macular Degeneration Ability to See in Adequate Light: 1 Impaired Patient Visual Report: No change from baseline        Perception         Praxis         Pertinent Vitals/Pain Pain Assessment Pain Assessment: No/denies pain     Extremity/Trunk Assessment Upper Extremity Assessment Upper Extremity Assessment: Generalized weakness;RUE deficits/detail;LUE deficits/detail RUE Deficits / Details: increased time to initiation movement, able to perform functional AROM but when asked to perform tasks does not complete due to poor sequencing/cognition LUE Deficits / Details: increased time to initiation movement, able to perform functional AROM but when asked to perform tasks does not complete due to poor sequencing/cognition   Lower Extremity Assessment Lower Extremity Assessment: Defer to PT evaluation   Cervical / Trunk Assessment Cervical / Trunk Assessment:  (forward head)   Communication Communication Communication: Impaired Factors Affecting Communication: Reduced clarity of speech;Difficulty expressing self   Cognition Arousal: Lethargic Behavior During Therapy: Flat affect Cognition: Cognition impaired             OT - Cognition Comments: difficult to assess. Pt able to follow some commands inconsistently, not able to reply to most questions but does at times, low motivation and lethargy limiting particpiation.                 Following commands: Impaired Following commands impaired: Follows one step commands inconsistently     Cueing  General Comments   Cueing Techniques: Verbal cues;Gestural cues      Exercises     Shoulder Instructions      Home Living Family/patient expects to be discharged to:: Private residence Living Arrangements: Spouse/significant other Available Help at Discharge: Family Type of Home: House Home Access: Stairs to enter [employer] of Steps: 7 + 7 Entrance Stairs-Rails: Right;Left Home Layout: Two level;Laundry or work area in Artist of Steps: flight to basement   [employer] Shower/Tub:  Chief Strategy Officer: Standard     Home Equipment: None   Additional Comments: Above information from previous chart entries.      Prior Functioning/Environment Prior Level of Function : Patient poor historian/Family not available             Mobility Comments: per chart has been independent in recent past ADLs Comments: per chart has been independent in recent past    OT Problem List: Decreased strength;Decreased activity tolerance;Impaired balance (sitting and/or standing);Decreased cognition;Decreased safety awareness;Impaired UE functional use   OT Treatment/Interventions: Self-care/ADL training;Therapeutic exercise;Energy conservation;DME and/or AE instruction;Therapeutic activities;Balance training;Patient/family education      OT Goals(Current goals can be found in the care plan section)   Acute Rehab OT Goals Patient Stated Goal: unable to participate in goal setting OT Goal Formulation: Patient unable to participate in goal setting Time For Goal Achievement: 03/13/24 Potential to Achieve Goals: Fair   OT Frequency:  Min 2X/week    Co-evaluation              AM-PAC OT 6 Clicks Daily Activity     Outcome Measure Help from another person eating meals?: A Lot Help from another person taking care of personal grooming?: A Lot Help from another person toileting, which includes using toliet, bedpan, or urinal?: A Lot Help from another person bathing (including washing, rinsing, drying)?: A Lot Help from another person to put on and taking off  regular upper body clothing?: A Lot Help from another person to put on and taking off regular lower body clothing?: A Lot 6 Click Score: 12   End of Session Equipment Utilized During Treatment: Gait belt;Rolling walker (2 wheels) Nurse Communication: Mobility status  Activity Tolerance: Patient limited by fatigue;Patient limited by lethargy Patient left: in bed;with call bell/phone within reach;with bed  alarm set  OT Visit Diagnosis: Unsteadiness on feet (R26.81);Other abnormalities of gait and mobility (R26.89);Muscle weakness (generalized) (M62.81);Other symptoms and signs involving cognitive function                Time: 8771-8750 OT Time Calculation (min): 21 min Charges:  OT General Charges $OT Visit: 1 Visit OT Evaluation $OT Eval Moderate Complexity: 1 [address] OTR/L   Elouise JONELLE Bott 02/28/2024, 12:59 PM

## 2024-02-29 ENCOUNTER — Other Ambulatory Visit (HOSPITAL_COMMUNITY): Payer: Self-pay

## 2024-02-29 DIAGNOSIS — G9341 Metabolic encephalopathy: Secondary | ICD-10-CM | POA: Diagnosis not present

## 2024-02-29 MED ORDER — LEVOTHYROXINE SODIUM 25 MCG PO TABS
25.0000 ug | ORAL_TABLET | Freq: Every day | ORAL | 0 refills | Status: AC
  Filled 2024-02-29: qty 30, 30d supply, fill #0

## 2024-02-29 MED ORDER — LORAZEPAM 1 MG PO TABS: 1.0000 mg | ORAL_TABLET | Freq: Once | ORAL | Status: DC

## 2024-02-29 MED ORDER — LORAZEPAM 0.5 MG PO TABS
0.5000 mg | ORAL_TABLET | Freq: Two times a day (BID) | ORAL | 0 refills | Status: DC | PRN
  Filled 2024-02-29: qty 20, 10d supply, fill #0

## 2024-02-29 MED ORDER — METOPROLOL TARTRATE 5 MG/5ML IV SOLN: 2.5000 mg | Freq: Three times a day (TID) | INTRAVENOUS | Status: DC | PRN

## 2024-02-29 MED ORDER — VALSARTAN 160 MG PO TABS
160.0000 mg | ORAL_TABLET | Freq: Every day | ORAL | 0 refills | Status: AC
  Filled 2024-02-29: qty 30, 30d supply, fill #0

## 2024-02-29 MED ORDER — LORAZEPAM 1 MG PO TABS
1.0000 mg | ORAL_TABLET | Freq: Once | ORAL | Status: AC
  Administered 2024-02-29: 1 mg via ORAL
  Filled 2024-02-29: qty 1

## 2024-02-29 MED ORDER — HYDRALAZINE HCL 50 MG PO TABS
100.0000 mg | ORAL_TABLET | Freq: Once | ORAL | Status: AC
  Administered 2024-02-29: 100 mg via ORAL
  Filled 2024-02-29: qty 2

## 2024-02-29 MED ORDER — CYANOCOBALAMIN 1000 MCG PO TABS
1000.0000 ug | ORAL_TABLET | Freq: Every day | ORAL | 0 refills | Status: AC
  Filled 2024-02-29: qty 30, 30d supply, fill #0

## 2024-02-29 MED ORDER — HYDRALAZINE HCL 20 MG/ML IJ SOLN
10.0000 mg | INTRAMUSCULAR | Status: DC | PRN
  Administered 2024-02-29: 10 mg via INTRAVENOUS

## 2024-02-29 MED ORDER — OLANZAPINE 2.5 MG PO TABS
2.5000 mg | ORAL_TABLET | Freq: Every day | ORAL | 0 refills | Status: DC
  Filled 2024-02-29: qty 30, 30d supply, fill #0

## 2024-02-29 MED ORDER — HYDRALAZINE HCL 50 MG PO TABS
50.0000 mg | ORAL_TABLET | Freq: Three times a day (TID) | ORAL | Status: DC
  Administered 2024-02-29 – 2024-03-01 (×2): 50 mg via ORAL
  Filled 2024-02-29 (×2): qty 1

## 2024-02-29 MED ORDER — DIPHENHYDRAMINE HCL 50 MG/ML IJ SOLN: 50.0000 mg | Freq: Three times a day (TID) | INTRAMUSCULAR | Status: DC | PRN

## 2024-02-29 MED ORDER — HYDRALAZINE HCL 50 MG PO TABS
50.0000 mg | ORAL_TABLET | ORAL | 0 refills | Status: DC
  Filled 2024-02-29: qty 60, 15d supply, fill #0

## 2024-02-29 NOTE — Plan of Care (Signed)
 After the patient was discharged patient's daughter Arland who is a nurse herself and wanted her mom to be discharged earlier this morning came to see the patient along with patient's husband and 2 other family members, they then decided that they will not be able to provide her adequate care at this stage.  They wanted another review by psychiatrist and neurologist, both teams were called and patient was seen by both psychiatry and neurology.  Per neurologist Dr. Matthews patient does not have a reversible neurological diagnosis at this time and postdischarge should follow-up with a neurologist in the outpatient setting for further workup if desired.  Most of her symptoms are consistent with a underlying psych issue, Dr. Larina psychiatrist saw the patient again and thought that patient might do better in a Geri psych unit for which bed placement request has been placed.  Patient will be kept here till such an arrangement can be needed.  Medically she is stable as long as she takes her blood pressure medications.Karla Richards

## 2024-02-29 NOTE — TOC Initial Note (Addendum)
 Transition of Care [facility]) - Initial/Assessment Note    Patient Details  Name: Karla Richards MRN: [id number] Date of Birth: [birth year]  [facility] CM/SW Contact:    Marval Gell, RN Phone Number: 02/29/2024, 7:42 AM  Clinical Narrative:                  Patient admitted from home, lives w spouse.  Discharged 2 weeks ago and set up with [facility] for PT OT per chart review. Call out to [facility] to verify if she is still active. [facility] verified patient is active for PT OT, will need ROS orders if home w [facility] is chosen disposition.  Patient admitted with altered mental status, psychiatric consult ongoing. ICM team will continue to follow.   Patient with DC, notified [facility] of DC today, and spoke with daughter [place]. Family will transport home, patient has all DME needed. No other ICM needs.         Patient Goals and CMS Choice            Expected Discharge Plan and Services                                              Prior Living Arrangements/Services                       Activities of Daily Living   ADL Screening (condition at time of admission) Independently performs ADLs?: No Does the patient have a NEW difficulty with bathing/dressing/toileting/self-feeding that is expected to last >3 days?: Yes (Initiates electronic notice to provider for possible OT consult) Does the patient have a NEW difficulty with getting in/out of bed, walking, or climbing stairs that is expected to last >3 days?: Yes (Initiates electronic notice to provider for possible PT consult) Does the patient have a NEW difficulty with communication that is expected to last >3 days?: No Is the patient deaf or have difficulty hearing?: No Does the patient have difficulty seeing, even when wearing glasses/contacts?: No Does the patient have difficulty concentrating, remembering, or making decisions?: Yes  Permission Sought/Granted                  Emotional  Assessment              Admission diagnosis:  Aphasia [R47.01] Weakness [R53.1] Acute metabolic encephalopathy [G93.41] Patient Active Problem List   Diagnosis Date Noted   Acute metabolic encephalopathy 02/26/2024   Acute encephalopathy 02/09/2024   Abnormal cardiovascular stress test 06/11/2022   Lateral epicondylitis, right elbow 02/09/2022   Glaucoma 02/28/2021   Hypokalemia 02/28/2021   Stage 3a chronic kidney disease (HCC) 02/28/2021   TIA (transient ischemic attack) 10/09/2014   Dizziness, nonspecific 10/09/2014   Dizziness    Left leg numbness 03/10/2013   Chest pain on exertion 03/10/2013   HTN (hypertension) 03/10/2013   PCP:  Benjamine Aland, MD Pharmacy:   [facility] [address] [place] - [address] [place] [address] Phone: [phone] Fax: [phone]  [facility] - [facility] [address] [place] [address] Phone: [phone] Fax: [phone]     Social Drivers of Health (SDOH) Social History: SDOH Screenings   Food Insecurity: No Food Insecurity (02/27/2024)  Housing: Low Risk  (02/27/2024)  Transportation Needs: No Transportation Needs (02/27/2024)  Utilities:  Not At Risk (02/27/2024)  Social Connections: Moderately Integrated (02/09/2024)  Tobacco Use: Low Risk  (02/16/2024)   SDOH Interventions:     Readmission Risk Interventions     No data to display

## 2024-02-29 NOTE — Plan of Care (Signed)

## 2024-02-29 NOTE — Discharge Summary (Addendum)
 Karla Richards FMW:[id number] DOB: 10-27-1945 DOA: 02/25/2024  PCP: Benjamine Aland, MD  Admit date: 02/25/2024  Discharge date: 03/02/2024  Admitted From: Home   Disposition:  Home   Recommendations for Outpatient Follow-up:   Follow up with PCP in 1-2 weeks  PCP Please obtain BMP/CBC, 2 view CXR in 1week,  (see Discharge instructions)   PCP Please follow up on the following pending results:    Home Health: None   Equipment/Devices: None  Consultations: Psych, Neuro Discharge Condition: Stable    CODE STATUS: Full    Diet Recommendation: Heart Healthy     Chief Complaint  Patient presents with   Altered Mental Status     Brief history of present illness from the day of admission and additional interim summary    78 y.o. female with medical history significant of ?dementia, CAD, HTN, hypothyroidism, GERD, anxiety/panic attacks with several recent visits to ED with concern for altered mental status and stroke.  Extensive stroke workup has been negative.  Patient was brought in yesterday for decreased responsiveness/altered mental status, her husband was unable to arouse her.  Workup in ED was notable for hypertensive urgency, hyponatremia and initial decreased consciousness however woke up quickly after coming to the ED.  After she woke up however patient was noted to be extremely paranoid, noncooperative but no focal deficits.  Patient was seen by neurology and head imaging was unrevealing.  Psychiatry consultation was recommended.   Of note, pt was recently evaluated by her PCP who started her on citalopram  3 days ago. Husband denies any history of mental health issues, but noted that she was previously treated for anxiety/depression related to her last marriage. Additionally, during her last admission on  02/10/24 there was some concern for dementia and OP neurocognitive testing was recommended.   Per psychiatric note from 714: Called patient's daughter, Daune Gist ([phone]), who is a psychiatric nurse. When Daune saw the patient three weeks ago, she reports that she was alert, coherent, and seemed like herself. She also reports that Mr. Trudy reports that the patient had falls, urinary incontinence, sleep disturbances, bizarre behavior, incoherent/rambling speech, and paranoid thoughts since February.                                                                  Hospital Course   Altered mental status Metabolic encephalopathy vs dementia Paranoia/anxiety/catatonia  Very much appreciate both neurology and psychiatry input today.  Patient has had extensive neurowork-up at previous hospitalization, no further inpatient workup warranted per neurology notes.  If desired outpatient workup can continue Case discussed with Dr. Matthews on 02/29/2024. Seen by psychiatry, initially family was leaning towards discharging the patient and taking her home however subsequently they changed her mind and wanted her symptoms to be better controlled prior to them  taking her to home, psych now thinks patient will be appropriate for a Geri psych bed.  She is currently on benzodiazepines for possible questionable catatonia.     She has been seen both by neurology and psychiatry.  Per psych she has been started on Ativan  for possible catatonia with some improvement, family still contemplating between home and [facility] psych bed.  Discussed with psychiatry again on 03/02/2024, they discussed with family family now wants to take the patient home, also called husband called x 5 today between 8 AM and 1130 AM, 2 messages left on his cell phone.  Psych patient will be discharged on as needed Ativan  with outpatient psych follow-up.  Patient has responded well to empiric catatonia treatment with Ativan , likely has  underlying catatonia/psychiatric issues.  Unlikely this being a chronic neurological issue.   Hyponatremia - AKI - Improved after hydration   Hypothyroidism  Continue Synthroid , continue home Synthroid    Hypertensive urgency - HTN  When compliant with blood pressure medications blood pressure is stable, blood pressure medications adjusted further on 03/02/2024, has multiple drug allergies limiting our treatment choices.   Vitamin B12 deficiency.  Placed on replacement.  Not compliant with her home B12 medication.   Discharge diagnosis     Principal Problem:   Acute metabolic encephalopathy    Discharge instructions    Discharge Instructions     Diet - low sodium heart healthy   Complete by: As directed    Discharge instructions   Complete by: As directed    Follow with Primary MD Benjamine Aland, MD in 3 days follow-up with your psychiatrist within a week of discharge.  Get CBC, CMP  -  checked next visit with your primary M   Activity: As tolerated with Full fall precautions use walker/cane & assistance as needed  Disposition Home   Diet: Heart Healthy    Special Instructions: If you have smoked or chewed Tobacco  in the last 2 yrs please stop smoking, stop any regular Alcohol  and or any Recreational drug use.  On your next visit with your primary care physician please Get Medicines reviewed and adjusted.  Please request your Prim.MD to go over all Hospital Tests and Procedure/Radiological results at the follow up, please get all Hospital records sent to your Prim MD by signing hospital release before you go home.  If you experience worsening of your admission symptoms, develop shortness of breath, life threatening emergency, suicidal or homicidal thoughts you must seek medical attention immediately by calling 911 or calling your MD immediately  if symptoms less severe.  You Must read complete instructions/literature along with all the possible adverse reactions/side  effects for all the Medicines you take and that have been prescribed to you. Take any new Medicines after you have completely understood and accpet all the possible adverse reactions/side effects.   Do not drive when taking Pain medications.  Do not take more than prescribed Pain, Sleep and Anxiety Medications  Wear Seat belts while driving.   Please note  You were cared for by a hospitalist during your hospital stay. If you have any questions about your discharge medications or the care you received while you were in the hospital after you are discharged, you can call the unit and asked to speak with the hospitalist on call if the hospitalist that took care of you is not available. Once you are discharged, your primary care physician will handle any further medical issues. Please note that NO REFILLS for any  discharge medications will be authorized once you are discharged, as it is imperative that you return to your primary care physician (or establish a relationship with a primary care physician if you do not have one) for your aftercare needs so that they can reassess your need for medications and monitor your lab values.   Increase activity slowly   Complete by: As directed    No wound care   Complete by: As directed        Discharge Medications   Allergies as of 03/02/2024       Reactions   Fish Allergy Anaphylaxis   Food Anaphylaxis   All tree nuts - anaphylaxis    Mangifera Indica Fruit Ext (mango) [mangifera Indica] Anaphylaxis   Peanuts [peanut Oil] Anaphylaxis   Penicillins Anaphylaxis   Shellfish Allergy Swelling   Altafluor Benox [fluorescein -benoxinate] Itching, Swelling, Other (See Comments)   Eye irritation   Cipro [ciprofloxacin Hcl] Other (See Comments)   Unknown reaction OK to take tablet formulation - reaction only to eye drops   Coconut (cocos Nucifera) Other (See Comments)   Unknown reaction Reaction to the fruit and oil   Seconal [secobarbital Sodium] Other  (See Comments)   Syncope  Skin burning   Turmeric Other (See Comments)   Unknown reaction   Ace Inhibitors Hives   Alcohol-sulfur [elemental Sulfur] Other (See Comments)   Excessive drowsiness    Alphagan  [brimonidine ] Itching, Swelling, Other (See Comments)   Eye irritation   Bayer Aspirin  [aspirin ] Other (See Comments)   Stomach irritation   Beta Adrenergic Blockers Hives   Caffeine Anxiety, Other (See Comments)   Jittery   Iodine Solution [povidone Iodine] Itching   Reaction to topical solution   Latex Itching   Milk-related Compounds Other (See Comments)   Vertigo   Motrin [ibuprofen] Other (See Comments)   Stomach irritation   Norvasc [amlodipine] Hives, Swelling   Sulfa Antibiotics Other (See Comments)   Unknown reaction OK to take with Benadryl         Medication List     STOP taking these medications    hydrOXYzine  25 MG tablet Commonly known as: ATARAX    Teriparatide  560 MCG/2.24ML Sopn Commonly known as: Forteo        TAKE these medications    acetaminophen  500 MG tablet Commonly known as: TYLENOL  Take 1,000 mg by mouth 2 (two) times daily as needed for headache, fever or moderate pain (pain score 4-6).   citalopram  10 MG tablet Commonly known as: CELEXA  Take 10 mg by mouth daily.   cyanocobalamin  1000 MCG tablet Commonly known as: VITAMIN B12 Take 1 tablet (1,000 mcg total) by mouth daily.   hydrALAZINE  50 MG tablet Commonly known as: APRESOLINE  Take 1 tablet (50 mg total) by mouth 3 (three) times daily. What changed:  how much to take when to take this additional instructions   levothyroxine  25 MCG tablet Commonly known as: SYNTHROID  Take 1 tablet (25 mcg total) by mouth daily before breakfast.   LORazepam  1 MG tablet Commonly known as: ATIVAN  Take 1 tablet (1 mg total) by mouth every 8 (eight) hours as needed (anxiety, paranoia, agitation).   pantoprazole  40 MG tablet Commonly known as: Protonix  Take 1 tablet (40 mg total) by  mouth daily. What changed:  when to take this reasons to take this   tretinoin  0.05 % cream Commonly known as: RETIN-A  Apply 1 application  topically at bedtime as needed (skin spots).   valsartan  160 MG tablet Commonly known as: DIOVAN  Take  1 tablet (160 mg total) by mouth daily.   VITAMIN D -3 PO Take 1 capsule by mouth daily.         Follow-up Information     Benjamine Aland, MD. Schedule an appointment as soon as possible for a visit in 3 day(s).   Specialty: Family Medicine Why: And follow-up with your psychiatrist in 2 to 3 days Contact information: [address] [place] [address] [phone]         [facility]. Schedule an appointment as soon as possible for a visit in 1 week(s).   Specialty: Neurology Contact information: [address] [place]  [phone]        [facility] Follow up.   Why: for home health services Contact information: [address] [place] [address] [phone]         Tasia Lung, MD Follow up on 04/03/2024.   Specialty: Psychiatry Why: August 19th @ 2:30 PM Contact information: [address] [place] [address] [phone]         [facility] Follow up.   Specialty: Urgent Care Why: If any acute concerns open 24/7 or go to walk-in clinic at same facility Monday-Thursday 8 AM to 7 PM, Friday 8 AM to 3 PM, Saturday 9 AM to 12 PM Contact information: [address] [place]  [address] [phone]                Major procedures and Radiology Reports - PLEASE review detailed and final reports thoroughly  -      EEG adult Result Date: 02/26/2024 Shelton Arlin KIDD, MD     02/26/2024 10:07 AM Patient Name: Kadee Philyaw MRN: [id number] Epilepsy Attending: Arlin KIDD Shelton Referring Physician/Provider: Merrianne Locus, MD Date:  02/26/2024 Duration:  25.06 mins Patient history: 78yo F with ams. EEG to evaluate for seizure. Level of alertness: Awake AEDs during EEG study: None Technical aspects: This EEG study was done with scalp electrodes positioned according to the 10-20 International system of electrode placement. Electrical activity was reviewed with band pass filter of 1-70Hz , sensitivity of 7 uV/mm, display speed of 53mm/sec with a 60Hz  notched filter applied as appropriate. EEG data were recorded continuously and digitally stored.  Video monitoring was available and reviewed as appropriate. Description: The posterior dominant rhythm consists of 9-10 Hz activity of moderate voltage (25-35 uV) seen predominantly in posterior head regions, symmetric and reactive to eye opening and eye closing. There is also 13 to 15 Hz beta activity  distributed symmetrically and diffusely. Hyperventilation and photic stimulation were not performed.   IMPRESSION: This study is within normal limits. No seizures or epileptiform discharges were seen throughout the recording. A normal interictal EEG does not exclude the diagnosis of epilepsy. Priyanka KIDD Shelton   CT ANGIO HEAD NECK W WO CM (CODE STROKE) Result Date: 02/25/2024 CLINICAL DATA:  Neuro deficit, acute, stroke suspected. EXAM: CT ANGIOGRAPHY HEAD AND NECK WITH AND WITHOUT CONTRAST TECHNIQUE: Multidetector CT imaging of the head and neck was performed using the standard protocol during bolus administration of intravenous contrast. Multiplanar CT image reconstructions and MIPs were obtained to evaluate the vascular anatomy. Carotid stenosis measurements (when applicable) are obtained utilizing NASCET criteria, using the distal internal carotid diameter as the denominator. RADIATION DOSE REDUCTION: This exam was performed according to the departmental dose-optimization program which includes automated exposure control, adjustment of the mA and/or kV according  to patient size and/or use of iterative reconstruction  technique. CONTRAST:  75mL OMNIPAQUE  IOHEXOL  350 MG/ML SOLN COMPARISON:  CT head from earlier today.  The FINDINGS: CTA NECK FINDINGS Aortic arch: Great vessel origins are patent without significant stenosis. Right carotid system: No evidence of dissection, stenosis (50% or greater), or occlusion. Left carotid system: No evidence of dissection, stenosis (50% or greater), or occlusion. Vertebral arteries: Moderate right vertebral origin stenosis. Left vertebral artery is patent without significant stenosis. Skeleton: No acute abnormality on limited assessment. Other neck: No acute abnormality on limited assessment Upper chest: Visualized lung apices are clear. Review of the MIP images confirms the above findings CTA HEAD FINDINGS Anterior circulation: Bilateral intracranial ICAs, MCAs, and ACAs are patent without proximal hemodynamically significant stenosis. No aneurysm identified. Posterior circulation: Bilateral intradural vertebral arteries, basilar artery and bilateral posterior cerebral arteries are patent. Moderate right P2 PCA stenosis. No aneurysm identified. Venous sinuses: As permitted by contrast timing, patent. Review of the MIP images confirms the above findings IMPRESSION: 1. No emergent large vessel occlusion. 2. Moderate right P2 PCA stenosis. 3. Moderate right vertebral artery origin stenosis. Electronically Signed   By: Gilmore GORMAN Molt M.D.   On: 02/25/2024 21:59   CT HEAD CODE STROKE WO CONTRAST Result Date: 02/25/2024 CLINICAL DATA:  Code stroke.  Neuro deficit, acute, stroke suspected EXAM: CT HEAD WITHOUT CONTRAST TECHNIQUE: Contiguous axial images were obtained from the base of the skull through the vertex without intravenous contrast. RADIATION DOSE REDUCTION: This exam was performed according to the departmental dose-optimization program which includes automated exposure control, adjustment of the mA and/or kV according to patient size and/or use of iterative reconstruction technique.  COMPARISON:  MRI head February 09, 2024 and CT head June 25 25. FINDINGS: Brain: Remote left thalamic infarct. Patchy white matter hypodensities are nonspecific but compatible with chronic microvascular ischemic disease. No evidence of acute large vascular territory infarct, acute hemorrhage, mass lesion, midline shift or hydrocephalus. Vascular: No hyperdense vessel. Skull: No acute fracture. Sinuses/Orbits: Clear sinuses.  No acute orbital findings. Other: No mastoid effusions. ASPECTS [facility] Stroke Program Early CT Score) Total score (0-10 with 10 being normal): 10. IMPRESSION: 1. No evidence of acute intracranial abnormality. ASPECTS is 10. 2. Chronic microvascular ischemic change and remote left thalamic infarct. Code stroke imaging results were communicated on 02/25/2024 at 9:42 pm to provider Dr. Lindzen via secure text paging. Electronically Signed   By: Gilmore GORMAN Molt M.D.   On: 02/25/2024 21:43   MR BRAIN WO CONTRAST Result Date: 02/09/2024 CLINICAL DATA:  Initial evaluation for acute neuro deficit, stroke suspected. EXAM: MRI HEAD WITHOUT CONTRAST TECHNIQUE: Multiplanar, multiecho pulse sequences of the brain and surrounding structures were obtained without intravenous contrast. COMPARISON:  CTs from 02/08/2024. FINDINGS: Brain: Cerebral volume within normal limits for age. Patchy T2/FLAIR hyperintensity involving the periventricular deep white matter both cerebral hemispheres, consistent with chronic small vessel ischemic disease, moderate in nature. Remote lacunar infarct present at the left thalamus. No abnormal foci of restricted diffusion to suggest acute or subacute ischemia. Gray-white matter differentiation maintained. No areas of chronic cortical infarction. No acute intracranial hemorrhage. Few scattered chronic micro hemorrhages noted, likely hypertensive in nature. No mass lesion, midline shift or mass effect. No hydrocephalus or extra-axial fluid collection. Pituitary gland within normal  limits. Vascular: Major intracranial vascular flow voids are maintained. Skull and upper cervical spine: Craniocervical junction within normal limits. Bone marrow signal intensity normal. No scalp soft tissue abnormality. Sinuses/Orbits: Prior bilateral ocular lens replacement.  Paranasal sinuses are largely clear. Trace bilateral mastoid effusions noted, of doubtful significance. Other: None. IMPRESSION: 1. No acute intracranial abnormality. 2. Moderate chronic microvascular ischemic disease with remote left thalamic lacunar infarct. Electronically Signed   By: Morene Hoard M.D.   On: 02/09/2024 02:59   CT HEAD CODE STROKE WO CONTRAST Result Date: 02/09/2024 CLINICAL DATA:  Initial evaluation for acute neuro deficit, stroke suspected. No other relevant history provided. EXAM: CT HEAD WITHOUT CONTRAST CT ANGIOGRAPHY HEAD AND NECK TECHNIQUE: Multidetector CT imaging of the head and neck was performed using the standard protocol during bolus administration of intravenous contrast. Multiplanar CT image reconstructions and MIPs were obtained to evaluate the vascular anatomy. Carotid stenosis measurements (when applicable) are obtained utilizing NASCET criteria, using the distal internal carotid diameter as the denominator. RADIATION DOSE REDUCTION: This exam was performed according to the departmental dose-optimization program which includes automated exposure control, adjustment of the mA and/or kV according to patient size and/or use of iterative reconstruction technique. CONTRAST:  75mL OMNIPAQUE  IOHEXOL  350 MG/ML SOLN COMPARISON:  Prior study from 02/01/2024. FINDINGS: CT HEAD FINDINGS Brain: Cerebral volume within normal limits. Chronic microvascular ischemic disease with a few small remote lacunar infarcts about the basal ganglia and thalami. No acute intracranial hemorrhage. No acute large vessel territory infarct. No mass lesion or midline shift. No hydrocephalus or extra-axial fluid collection.  Vascular: No abnormal hyperdense vessel. Scattered vascular calcifications noted within the carotid siphons. Skull: Scalp soft tissues within normal limits.  Calvarium intact. Sinuses/Orbits: Globes orbital soft tissues within normal limits. Scattered mucosal thickening present about the ethmoidal air cells. Paranasal sinuses are otherwise clear. No significant mastoid effusion. Other: None. Review of the MIP images confirms the above findings CTA NECK FINDINGS Aortic arch: Standard branching. Imaged portion shows no evidence of aneurysm or dissection. No significant stenosis of the major arch vessel origins. Right carotid system: No evidence of dissection, stenosis (50% or greater), or occlusion. Left carotid system: No evidence of dissection, stenosis (50% or greater), or occlusion. Vertebral arteries: Codominant. No evidence of dissection, stenosis (50% or greater), Skeleton: No worrisome osseous lesions. Moderately advanced cervical spondylosis. Other neck: No other acute finding. Upper chest: Few small pulmonary nodules present within the left upper lobe, largest of which measures 5 mm (series 15, image 24). No other acute finding. Review of the MIP images confirms the above findings CTA HEAD FINDINGS Anterior circulation: Atheromatous change about the carotid siphons without hemodynamically significant stenosis. A1 segments patent bilaterally. Normal anterior communicating artery complex. Anterior cerebral arteries widely patent. No M1 stenosis or occlusion. Distal MCA branches perfused and symmetric. Posterior circulation: Both V4 segments patent without significant stenosis. Both PICA patent at their origins. Basilar patent without stenosis. Superior cerebral arteries patent bilaterally. Both PCA supplied via the basilar as well as small bilateral posterior arteries. Left PCA patent without stenosis. Focal moderate proximal right P2 stenosis (series 20, image 44). Right PCA otherwise patent. Venous sinuses:  Grossly patent allowing for timing the contrast bolus. Anatomic variants: None significant.  No aneurysm. Review of the MIP images confirms the above findings IMPRESSION: CT HEAD: 1. No acute intracranial abnormality. 2. Aspects is 10. 3. Chronic microvascular ischemic disease with a few small remote lacunar infarcts about the basal ganglia and thalami. CTA HEAD AND NECK: 1. Negative CTA for large vessel occlusion or other emergent finding. 2. Focal moderate proximal right P2 stenosis. 3. Otherwise wide patency of the major arterial vasculature of the head and neck. No other hemodynamically significant  or correctable stenosis. 4. Few small pulmonary nodules within the left upper lobe, largest of which measures 5 mm. Per Fleischner Society Guidelines, if patient is low risk for malignancy, no routine follow-up imaging is recommended. If patient is high risk for malignancy, a non-contrast Chest CT at 12 months is optional. If performed and the nodule is stable at 12 months, no further follow-up is recommended. These guidelines do not apply to immunocompromised patients and patients with cancer. Follow up in patients with significant comorbidities as clinically warranted. For lung cancer screening, adhere to Lung-RADS guidelines. Reference: Radiology. 2017; 284(1):228-43. Results were called by telephone at the time of interpretation on 02/09/2024 at 12:09 am to provider [facility] , who verbally acknowledged these results. Electronically Signed   By: Morene Hoard M.D.   On: 02/09/2024 00:13   CT ANGIO HEAD NECK W WO CM (CODE STROKE) Result Date: 02/09/2024 CLINICAL DATA:  Initial evaluation for acute neuro deficit, stroke suspected. No other relevant history provided. EXAM: CT HEAD WITHOUT CONTRAST CT ANGIOGRAPHY HEAD AND NECK TECHNIQUE: Multidetector CT imaging of the head and neck was performed using the standard protocol during bolus administration of intravenous contrast. Multiplanar CT image  reconstructions and MIPs were obtained to evaluate the vascular anatomy. Carotid stenosis measurements (when applicable) are obtained utilizing NASCET criteria, using the distal internal carotid diameter as the denominator. RADIATION DOSE REDUCTION: This exam was performed according to the departmental dose-optimization program which includes automated exposure control, adjustment of the mA and/or kV according to patient size and/or use of iterative reconstruction technique. CONTRAST:  75mL OMNIPAQUE  IOHEXOL  350 MG/ML SOLN COMPARISON:  Prior study from 02/01/2024. FINDINGS: CT HEAD FINDINGS Brain: Cerebral volume within normal limits. Chronic microvascular ischemic disease with a few small remote lacunar infarcts about the basal ganglia and thalami. No acute intracranial hemorrhage. No acute large vessel territory infarct. No mass lesion or midline shift. No hydrocephalus or extra-axial fluid collection. Vascular: No abnormal hyperdense vessel. Scattered vascular calcifications noted within the carotid siphons. Skull: Scalp soft tissues within normal limits.  Calvarium intact. Sinuses/Orbits: Globes orbital soft tissues within normal limits. Scattered mucosal thickening present about the ethmoidal air cells. Paranasal sinuses are otherwise clear. No significant mastoid effusion. Other: None. Review of the MIP images confirms the above findings CTA NECK FINDINGS Aortic arch: Standard branching. Imaged portion shows no evidence of aneurysm or dissection. No significant stenosis of the major arch vessel origins. Right carotid system: No evidence of dissection, stenosis (50% or greater), or occlusion. Left carotid system: No evidence of dissection, stenosis (50% or greater), or occlusion. Vertebral arteries: Codominant. No evidence of dissection, stenosis (50% or greater), Skeleton: No worrisome osseous lesions. Moderately advanced cervical spondylosis. Other neck: No other acute finding. Upper chest: Few small  pulmonary nodules present within the left upper lobe, largest of which measures 5 mm (series 15, image 24). No other acute finding. Review of the MIP images confirms the above findings CTA HEAD FINDINGS Anterior circulation: Atheromatous change about the carotid siphons without hemodynamically significant stenosis. A1 segments patent bilaterally. Normal anterior communicating artery complex. Anterior cerebral arteries widely patent. No M1 stenosis or occlusion. Distal MCA branches perfused and symmetric. Posterior circulation: Both V4 segments patent without significant stenosis. Both PICA patent at their origins. Basilar patent without stenosis. Superior cerebral arteries patent bilaterally. Both PCA supplied via the basilar as well as small bilateral posterior arteries. Left PCA patent without stenosis. Focal moderate proximal right P2 stenosis (series 20, image 44). Right PCA otherwise patent.  Venous sinuses: Grossly patent allowing for timing the contrast bolus. Anatomic variants: None significant.  No aneurysm. Review of the MIP images confirms the above findings IMPRESSION: CT HEAD: 1. No acute intracranial abnormality. 2. Aspects is 10. 3. Chronic microvascular ischemic disease with a few small remote lacunar infarcts about the basal ganglia and thalami. CTA HEAD AND NECK: 1. Negative CTA for large vessel occlusion or other emergent finding. 2. Focal moderate proximal right P2 stenosis. 3. Otherwise wide patency of the major arterial vasculature of the head and neck. No other hemodynamically significant or correctable stenosis. 4. Few small pulmonary nodules within the left upper lobe, largest of which measures 5 mm. Per Fleischner Society Guidelines, if patient is low risk for malignancy, no routine follow-up imaging is recommended. If patient is high risk for malignancy, a non-contrast Chest CT at 12 months is optional. If performed and the nodule is stable at 12 months, no further follow-up is recommended.  These guidelines do not apply to immunocompromised patients and patients with cancer. Follow up in patients with significant comorbidities as clinically warranted. For lung cancer screening, adhere to Lung-RADS guidelines. Reference: Radiology. 2017; 284(1):228-43. Results were called by telephone at the time of interpretation on 02/09/2024 at 12:09 am to provider [facility] , who verbally acknowledged these results. Electronically Signed   By: Morene Hoard M.D.   On: 02/09/2024 00:13   DG Chest 2 View Result Date: 02/08/2024 CLINICAL DATA:  sob EXAM: CHEST - 2 VIEW COMPARISON:  Chest x-ray 02/01/2024, CT angio chest 02/01/2024 FINDINGS: The heart and mediastinal contours are unchanged. Atherosclerotic plaque. No focal consolidation. Chronic coarsened interstitial markings with no overt pulmonary edema. No pleural effusion. No pneumothorax. No acute osseous abnormality. IMPRESSION: 1. No active cardiopulmonary disease. 2. Aortic Atherosclerosis (ICD10-I70.0) and Emphysema (ICD10-J43.9). Electronically Signed   By: Morgane  Naveau M.D.   On: 02/08/2024 22:55    Micro Results    Recent Results (from the past 240 hours)  SARS Coronavirus 2 by RT PCR (hospital order, performed in [facility] hospital lab) *cepheid single result test* Anterior Nasal Swab     Status: None   Collection Time: 03/01/24  3:17 PM   Specimen: Anterior Nasal Swab  Result Value Ref Range Status   SARS Coronavirus 2 by RT PCR NEGATIVE NEGATIVE Final    Comment: Performed at [facility], [address] [place], [place] [address]    Today   Subjective    Karla Richards today in bed, eating breakfast, denies any headache chest or abdominal pain.   Objective   Vitals:   03/01/24 2146 03/01/24 2337 03/02/24 0513 03/02/24 0753  BP: 133/84 (!) 118/58 (!) 145/77 (!) 121/53  Pulse:  (!) 56 62 65  Temp:  98.2 F (36.8 C) 97.6 F (36.4 C) (!) 97.5 F (36.4 C)  Resp:  16 19 19   Height:       Weight:      SpO2:  98% 99% 100%  TempSrc:  Axillary Axillary Axillary  BMI (Calculated):          Exam  Awake Alert, No new F.N deficits, anxious affect but answering basic questions.  In no distress. Ina.AT,PERRAL Supple Neck,   Symmetrical Chest wall movement, Good air movement bilaterally, CTAB RRR,No Gallops,   +ve B.Sounds, Abd Soft, Non tender,  No Cyanosis, Clubbing or edema    Data Review   Recent Labs  Lab 02/25/24 2122 02/25/24 2131 02/28/24 0550  WBC 8.1  --  4.9  HGB 13.2 13.9 11.8*  HCT 38.5 41.0 35.2*  PLT 291  --  209  MCV 87.5  --  90.3  MCH 30.0  --  30.3  MCHC 34.3  --  33.5  RDW 13.8  --  14.3  LYMPHSABS 0.9  --   --   MONOABS 0.8  --   --   EOSABS 0.0  --   --   BASOSABS 0.0  --   --     Recent Labs  Lab 02/25/24 2122 02/25/24 2131 02/26/24 1350 02/27/24 0426 02/28/24 0550  NA 126* 129*  --  133* 135  K 3.0* 3.1*  --  4.7 3.8  CL 94* 96*  --  107 104  CO2 19*  --   --  21* 23  ANIONGAP 13  --   --  5 8  GLUCOSE 147* 149*  --  93 78  BUN 9 7*  --  15 17  CREATININE 0.88 0.80  --  1.23* 1.07*  AST 31  --   --   --  38  ALT 16  --   --   --  23  ALKPHOS 60  --   --   --  46  BILITOT 0.8  --   --   --  0.5  ALBUMIN 4.0  --   --   --  3.4*  PROCALCITON  --   --  <0.10  --   --   INR 1.0  --   --   --   --   TSH  --  5.936*  --   --  2.828  MG  --  1.9  --   --  1.9  PHOS  --   --   --   --  3.7  CALCIUM  9.4  --   --  8.7* 9.4    Total Time in preparing paper work, data evaluation and todays exam - 35 minutes  Signature  -    Lavada Stank M.D on 03/02/2024 at 11:35 AM   -  To page go to [email or web address]

## 2024-02-29 NOTE — Care Management Important Message (Signed)
 Important Message  Patient Details  Name: Karla Richards MRN: [id number] Date of Birth: 02-Jun-1946   Important Message Given:  Yes - Medicare IM     [name] 02/29/2024, 11:20 AM

## 2024-02-29 NOTE — Plan of Care (Signed)
  Problem: Clinical Measurements: Goal: Ability to maintain clinical measurements within normal limits will improve Outcome: Progressing Goal: Will remain free from infection Outcome: Progressing   Problem: Activity: Goal: Risk for activity intolerance will decrease Outcome: Progressing   Problem: Coping: Goal: Level of anxiety will decrease Outcome: Progressing   Problem: Safety: Goal: Ability to remain free from injury will improve Outcome: Progressing

## 2024-02-29 NOTE — Plan of Care (Signed)
 RN reported that patient heart rate was upper 130s to 140s range and improved eventually to low 110 without any intervention.  Per chart review when patient initially admitted she had resting bradycardia. I believe patient has tachycardia from underlying paranoia and anxiety. Per chart review patient has history of hives with beta-blocker.  Given the risk is low with beta-blocker starting low-dose Lopressor  2.5 mg every 8 hour as needed for tachycardia above 1 30-1 40s range.

## 2024-02-29 NOTE — Progress Notes (Signed)
 Received call from telemetry that patient's heart rate was in the 140's and sustaining. RN went to assess patient. Patient lying comfortbaly in the bed with husband at bedside. Denies any complaints. Husband denies any contributing events that may have increased her HR. EKG performed and showed sinus tach. Pt's heart rate has decreased into 110's-120's. Dr. Lee notified.
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
## 2024-03-01 DIAGNOSIS — G9341 Metabolic encephalopathy: Secondary | ICD-10-CM | POA: Diagnosis not present

## 2024-03-01 LAB — SARS CORONAVIRUS 2 BY RT PCR: SARS Coronavirus 2 by RT PCR: NEGATIVE

## 2024-03-01 MED ORDER — HYDRALAZINE HCL 25 MG PO TABS
25.0000 mg | ORAL_TABLET | Freq: Three times a day (TID) | ORAL | Status: DC
Start: 2024-03-01 — End: 2024-03-03
  Administered 2024-03-01 – 2024-03-02 (×4): 25 mg via ORAL
  Filled 2024-03-01 (×4): qty 1

## 2024-03-01 MED ORDER — LORAZEPAM 1 MG PO TABS
1.0000 mg | ORAL_TABLET | Freq: Four times a day (QID) | ORAL | Status: DC
  Administered 2024-03-01 – 2024-03-02 (×5): 1 mg via ORAL
  Filled 2024-03-01 (×6): qty 1

## 2024-03-01 MED ORDER — METOPROLOL TARTRATE 25 MG PO TABS
25.0000 mg | ORAL_TABLET | Freq: Two times a day (BID) | ORAL | Status: DC
  Administered 2024-03-01: 25 mg via ORAL
  Filled 2024-03-01 (×2): qty 1

## 2024-03-01 NOTE — Progress Notes (Signed)
 Physical Therapy Treatment Patient Details Name: Karla Richards MRN: [id number] DOB: Dec 16, 1945 Today's Date: 03/01/2024   History of Present Illness Pt is a 78 y/o F admitted on 02/25/24 after presenting with decreased responsiveness/AMS. In ED, pt initially found to have hypertensive urgency, hyponatremia. Pt is being treated for metabolic encephalopathy vs dementia, paranoia/anxiety. PMH: ?dementia, CAD, HTN, hypothyroidism, GERD, anxiety/panic attacks    PT Comments  Pt with fair tolerance to treatment today. Pt more alert and talkative today compared to previous sessions per chart review. Pt today required +2 Min A to stand and take side steps. HR up to 156 with mobility therefore further mobility deferred. Patient will benefit from continued inpatient follow up therapy, <3 hours/day. PT will continue to follow.      If plan is discharge home, recommend the following: A lot of help with walking and/or transfers;A lot of help with bathing/dressing/bathroom;Direct supervision/assist for medications management;Assistance with cooking/housework;Direct supervision/assist for financial management;Assistance with feeding;Supervision due to cognitive status   Can travel by private vehicle        Equipment Recommendations  Other (comment)    Recommendations for Other Services       Precautions / Restrictions Precautions Precautions: Fall Recall of Precautions/Restrictions: Intact Restrictions Weight Bearing Restrictions Per Provider Order: No     Mobility  Bed Mobility Overal bed mobility: Needs Assistance Bed Mobility: Supine to Sit, Sit to Supine     Supine to sit: Contact guard Sit to supine: Max assist   General bed mobility comments: CGA with increased time to get EOB. Constant cues to stay on task. Mod A with BLE management to return to bed.    Transfers Overall transfer level: Needs assistance Equipment used: 2 person hand held assist Transfers: Sit to/from  Stand Sit to Stand: +2 physical assistance, Min assist           General transfer comment: +2 Min A to stand and take side steps. HR up to 156 with mobility therefore further mobility deferred.    Ambulation/Gait                   Stairs             Wheelchair Mobility     Tilt Bed    Modified Rankin (Stroke Patients Only)       Balance Overall balance assessment: Needs assistance Sitting-balance support: No upper extremity supported, Feet supported Sitting balance-Leahy Scale: Poor Sitting balance - Comments: CGA to mod A for sitting EOB, poor overall balance with R lateral lean forward lean   Standing balance support: Bilateral upper extremity supported, During functional activity, Reliant on assistive device for balance Standing balance-Leahy Scale: Poor                              Communication Communication Communication: Impaired Factors Affecting Communication: Reduced clarity of speech;Difficulty expressing self  Cognition Arousal: Alert Behavior During Therapy: Flat affect   PT - Cognitive impairments: Difficult to assess Difficult to assess due to: Impaired communication                     PT - Cognition Comments: Pt very flat and soft spoken. Pt A&Ox2 stating that the year was 2015. Following commands: Impaired Following commands impaired: Follows one step commands inconsistently    Cueing Cueing Techniques: Verbal cues, Gestural cues  Exercises      General Comments General comments (skin integrity, edema,  etc.): HR up 156 during mobility.      Pertinent Vitals/Pain Pain Assessment Pain Assessment: No/denies pain    Home Living                          Prior Function            PT Goals (current goals can now be found in the care plan section) Progress towards PT goals: Progressing toward goals    Frequency    Min 2X/week      PT Plan      Co-evaluation               AM-PAC PT 6 Clicks Mobility   Outcome Measure  Help needed turning from your back to your side while in a flat bed without using bedrails?: A Little Help needed moving from lying on your back to sitting on the side of a flat bed without using bedrails?: A Little Help needed moving to and from a bed to a chair (including a wheelchair)?: A Lot Help needed standing up from a chair using your arms (e.g., wheelchair or bedside chair)?: A Lot Help needed to walk in hospital room?: Total Help needed climbing 3-5 steps with a railing? : Total 6 Click Score: 12    End of Session Equipment Utilized During Treatment: Gait belt Activity Tolerance: Patient tolerated treatment well Patient left: in bed;with call bell/phone within reach;with bed alarm set Nurse Communication: Mobility status PT Visit Diagnosis: Other abnormalities of gait and mobility (R26.89)     Time: 8980-8966 PT Time Calculation (min) (ACUTE ONLY): 14 min  Charges:    $Gait Training: 8-22 mins PT General Charges $$ ACUTE PT VISIT: 1 Visit                     Karlo Goeden B, PT, DPT Acute Rehab Services [phone]    Dawud Mays 03/01/2024, 12:00 PM

## 2024-03-01 NOTE — Plan of Care (Signed)

## 2024-03-01 NOTE — Progress Notes (Signed)
 Occupational Therapy Treatment Patient Details Name: Karla Richards MRN: [id number] DOB: 1946-01-05 Today's Date: 03/01/2024   History of present illness Pt is a 78 y/o F admitted on 02/25/24 after presenting with decreased responsiveness/AMS. In ED, pt initially found to have hypertensive urgency, hyponatremia. Pt is being treated for metabolic encephalopathy vs dementia, paranoia/anxiety. PMH: ?dementia, CAD, HTN, hypothyroidism, GERD, anxiety/panic attacks   OT comments  Pt resting in bed comfortably, eating breakfast. Pt worked with PT earlier and HR was in 150s, took sidesteps in bed. She improved significantly and was able to ambulate down hall 60 feet at [facility] with RW for support, HR in 70s. Pt was able to follow simple commands and converse fairly consistently, still with some confusion and frequently forgetting what she was saying as she was saying it. Pt completed UB bathing at bedside with set up. Pt able to stand at sink with one hand supported on RW while arranging items on counter, no LOB. Due to inconsistent levels and confusion, still recommending postacute rehab <3hrs/day, but if continues to do well and has support at home may do well with HH follow up, will need RW for home as Pt states she only has a cane, may need to confirm with husband. Will continue to see acutely to progress as able.       If plan is discharge home, recommend the following:  A little help with walking and/or transfers;A little help with bathing/dressing/bathroom;Assistance with cooking/housework;Assist for transportation;Help with stairs or ramp for entrance   Equipment Recommendations  Other (comment) (RW)    Recommendations for Other Services      Precautions / Restrictions Precautions Precautions: Fall Recall of Precautions/Restrictions: Intact Restrictions Weight Bearing Restrictions Per Provider Order: No       Mobility Bed Mobility Overal bed mobility: Needs Assistance Bed  Mobility: Supine to Sit, Sit to Supine     Supine to sit: Contact guard Sit to supine: Contact guard assist   General bed mobility comments: CGA with increased time and cueing    Transfers Overall transfer level: Needs assistance Equipment used: Rolling walker (2 wheels) Transfers: Sit to/from Stand Sit to Stand: Contact guard assist           General transfer comment: CGA with RW     Balance Overall balance assessment: Needs assistance Sitting-balance support: Feet supported Sitting balance-Leahy Scale: Fair Sitting balance - Comments: EOB ADLs   Standing balance support: Single extremity supported, During functional activity Standing balance-Leahy Scale: Fair Standing balance comment: Pt ambulated 60 feet, at some points only using 1 hand on RW                           ADL either performed or assessed with clinical judgement   ADL Overall ADL's : Needs assistance/impaired Eating/Feeding: Independent   Grooming: Set up;Sitting   Upper Body Bathing: Set up;Contact guard assist;Sitting   Lower Body Bathing: Minimal assistance;Sit to/from stand   Upper Body Dressing : Set up;Contact guard assist;Sitting   Lower Body Dressing: Minimal assistance;Sit to/from stand   Toilet Transfer: Contact guard assist;Rolling walker (2 wheels)   Toileting- Clothing Manipulation and Hygiene: Contact guard assist;Minimal assistance;Sitting/lateral lean;Sit to/from stand       Functional mobility during ADLs: Contact guard assist;Rolling walker (2 wheels) General ADL Comments: Pt doing much better, able to follow simple commands fairly consistently, some with increased time. Pt CGA for most ADLs, min A for LB ADLs.    Extremity/Trunk  Assessment Upper Extremity Assessment Upper Extremity Assessment: Generalized weakness            Vision       Perception     Praxis     Communication Communication Communication: No apparent difficulties Factors Affecting  Communication: Reduced clarity of speech;Difficulty expressing self   Cognition Arousal: Alert Behavior During Therapy: Flat affect Cognition: Cognition impaired             OT - Cognition Comments: doing much better today, able to converse, is forgetful and frequently says she forgets what she was trying to say. Pt speaks a lot about religion and is confused, but able to follow simple commands fairly consistently                 Following commands: Impaired Following commands impaired: Follows one step commands with increased time      Cueing   Cueing Techniques: Verbal cues, Gestural cues  Exercises      Shoulder Instructions       General Comments HR up 156 during mobility.    Pertinent Vitals/ Pain       Pain Assessment Pain Assessment: No/denies pain  Home Living                                          Prior Functioning/Environment              Frequency  Min 2X/week        Progress Toward Goals  OT Goals(current goals can now be found in the care plan section)  Progress towards OT goals: Progressing toward goals  Acute Rehab OT Goals Patient Stated Goal: to return home OT Goal Formulation: With patient Time For Goal Achievement: 03/13/24 Potential to Achieve Goals: Fair ADL Goals Pt Will Perform Lower Body Bathing: with modified independence;sit to/from stand Pt Will Perform Upper Body Dressing: with set-up;with supervision Pt Will Perform Lower Body Dressing: with set-up;with supervision Pt Will Transfer to Toilet: with set-up;with supervision Pt Will Perform Toileting - Clothing Manipulation and hygiene: with set-up;with supervision Additional ADL Goal #1: Pt will be able to follow commands >75% of time to maximize participation/independence in ADLs.  Plan      Co-evaluation                 AM-PAC OT 6 Clicks Daily Activity     Outcome Measure   Help from another person eating meals?: None Help from  another person taking care of personal grooming?: A Little Help from another person toileting, which includes using toliet, bedpan, or urinal?: A Little Help from another person bathing (including washing, rinsing, drying)?: A Little Help from another person to put on and taking off regular upper body clothing?: A Little Help from another person to put on and taking off regular lower body clothing?: A Little 6 Click Score: 19    End of Session Equipment Utilized During Treatment: Gait belt;Rolling walker (2 wheels)  OT Visit Diagnosis: Unsteadiness on feet (R26.81);Other abnormalities of gait and mobility (R26.89);Muscle weakness (generalized) (M62.81);Other symptoms and signs involving cognitive function   Activity Tolerance Patient tolerated treatment well   Patient Left in bed;with call bell/phone within reach;with bed alarm set   Nurse Communication Mobility status        Time: 8841-8760 OT Time Calculation (min): 41 min  Charges: OT General Charges $OT Visit: 1 Visit OT  Treatments $Self Care/Home Management : 38-52 mins  [place], OTR/L   Elouise JONELLE Bott 03/01/2024, 12:52 PM

## 2024-03-01 NOTE — Progress Notes (Signed)
 PROGRESS NOTE    Karla Richards  FMW:[id number]  DOB: 1946/05/14  DOA: 02/25/2024 PCP: Benjamine Aland, MD Outpatient Specialists:   Hospital course:   78 y.o. female with medical history significant of ?dementia, CAD, HTN, hypothyroidism, GERD, anxiety/panic attacks with several recent visits to ED with concern for altered mental status and stroke.  Extensive stroke workup has been negative.  Patient was brought in yesterday for decreased responsiveness/altered mental status, her husband was unable to arouse her.  Workup in ED was notable for hypertensive urgency, hyponatremia and initial decreased consciousness however woke up quickly after coming to the ED.  After she woke up however patient was noted to be extremely paranoid, noncooperative but no focal deficits.  Patient was seen by neurology and head imaging was unrevealing.  Psychiatry consultation was recommended.  Of note, pt was recently evaluated by her PCP who started her on citalopram  3 days ago. Husband denies any history of mental health issues, but noted that she was previously treated for anxiety/depression related to her last marriage. Additionally, during her last admission on 02/10/24 there was some concern for dementia and OP neurocognitive testing was recommended.  Per psychiatric note from 714: Called patient's daughter, Daune Gist ([phone]), who is a psychiatric nurse. When Daune saw the patient three weeks ago, she reports that she was alert, coherent, and seemed like herself. She also reports that Mr. Trudy reports that the patient had falls, urinary incontinence, sleep disturbances, bizarre behavior, incoherent/rambling speech, and paranoid thoughts since February.   Subjective:  Patient was seen in no distress but does not want to talk  Objective: Vitals:   02/29/24 2056 02/29/24 2347 03/01/24 0200 03/01/24 0423  BP: 124/62 (!) 93/55 (!) 110/58 136/72  Pulse: (!) 103 81 85 83  Resp: 16 17  20 20   Temp: (!) 97.5 F (36.4 C) 97.7 F (36.5 C)  97.6 F (36.4 C)  TempSrc: Oral Oral  Oral  SpO2: 97% 95% 97% 94%  Weight:      Height:        Intake/Output Summary (Last 24 hours) at 03/01/2024 0758 Last data filed at 03/01/2024 0600 Gross per 24 hour  Intake --  Output 900 ml  Net -900 ml   Filed Weights   02/25/24 2100 02/25/24 2144 02/27/24 1148  Weight: 42.6 kg 42.6 kg 42.6 kg     Exam:  Awake Alert, No new F.N deficits, anxious affect, refuses to talk or answer questions .AT,PERRAL Supple Neck, No JVD,   Symmetrical Chest wall movement, Good air movement bilaterally, CTAB RRR,No Gallops, Rubs or new Murmurs,  +ve B.Sounds, Abd Soft, No tenderness,   No Cyanosis, Clubbing or edema     Assessment & Plan:   Altered mental status Metabolic encephalopathy vs dementia Paranoia/anxiety Very much appreciate both neurology and psychiatry input  Patient has had extensive neurowork-up at previous hospitalization, no further inpatient workup warranted per neurology notes.  If desired outpatient workup can continue Case discussed with Dr. Matthews on 02/29/2024. Seen by psychiatry, initially family was leaning towards discharging the patient and taking her home however subsequently they changed her mind and wanted her symptoms to be better controlled prior to them taking her to home, psych now thinks patient will be appropriate for a Geri psych bed.  She is currently on benzodiazepines for possible questionable catatonia.  Defer management of this problem to psych, await Geri psych bed.  She has been seen by both neurology and psych this admission.  Psych had detailed  discussion with patient's family bedside on 02/29/2024, neurology came to talk to the family as well but they had left, will try and see if neurology can give patient's husband a call.   Hyponatremia - AKI - Improved after hydration  Hypothyroidism  Continue Synthroid , continue home Synthroid   Hypertensive  urgency - HTN  When compliant with blood pressure medications blood pressure is stable, blood pressure medications adjusted further on 03/01/2024, has multiple drug allergies limiting our treatment choices.  Vitamin B12 deficiency.  Placed on replacement.    DVT prophylaxis: Lovenox  Code Status: Full Family Communication: Husband Lupita [phone]  over the phone on 02/28/2024, message left 02/29/2024,  daughter Arland over the phone [phone] on 02/29/2024, husband and daughter Arland along with multiple family members bedside again later on 02/29/2024    Data Review:   Inpatient Medications  Scheduled Meds:  cyanocobalamin   1,000 mcg Subcutaneous Daily   [START ON 03/02/2024] vitamin B-12  1,000 mcg Oral Daily   enoxaparin  (LOVENOX ) injection  30 mg Subcutaneous Daily   hydrALAZINE   25 mg Oral Q8H   levothyroxine   25 mcg Oral QAC breakfast   losartan   50 mg Oral Daily   metoprolol  tartrate  25 mg Oral BID   pantoprazole   40 mg Oral Daily   Continuous Infusions:   PRN Meds:.diphenhydrAMINE , hydrALAZINE , LORazepam  **OR** LORazepam , metoprolol  tartrate  DVT Prophylaxis  enoxaparin  (LOVENOX ) injection 30 mg Start: 02/26/24 1000       Recent Labs  Lab 02/25/24 2122 02/25/24 2131 02/28/24 0550  WBC 8.1  --  4.9  HGB 13.2 13.9 11.8*  HCT 38.5 41.0 35.2*  PLT 291  --  209  MCV 87.5  --  90.3  MCH 30.0  --  30.3  MCHC 34.3  --  33.5  RDW 13.8  --  14.3  LYMPHSABS 0.9  --   --   MONOABS 0.8  --   --   EOSABS 0.0  --   --   BASOSABS 0.0  --   --     Recent Labs  Lab 02/25/24 2122 02/25/24 2131 02/26/24 1350 02/27/24 0426 02/28/24 0550  NA 126* 129*  --  133* 135  K 3.0* 3.1*  --  4.7 3.8  CL 94* 96*  --  107 104  CO2 19*  --   --  21* 23  ANIONGAP 13  --   --  5 8  GLUCOSE 147* 149*  --  93 78  BUN 9 7*  --  15 17  CREATININE 0.88 0.80  --  1.23* 1.07*  AST 31  --   --   --  38  ALT 16  --   --   --  23  ALKPHOS 60  --   --   --  46  BILITOT 0.8  --   --    --  0.5  ALBUMIN 4.0  --   --   --  3.4*  PROCALCITON  --   --  <0.10  --   --   INR 1.0  --   --   --   --   TSH  --  5.936*  --   --  2.828  MG  --  1.9  --   --  1.9  PHOS  --   --   --   --  3.7  CALCIUM  9.4  --   --  8.7* 9.4      Recent Labs  Lab 02/25/24 2122  02/25/24 2131 02/26/24 1350 02/27/24 0426 02/28/24 0550  PROCALCITON  --   --  <0.10  --   --   INR 1.0  --   --   --   --   TSH  --  5.936*  --   --  2.828  MG  --  1.9  --   --  1.9  CALCIUM  9.4  --   --  8.7* 9.4    --------------------------------------------------------------------------------------------------------------- Lab Results  Component Value Date   CHOL 184 03/01/2021   HDL 64 03/01/2021   LDLCALC 105 (H) 03/01/2021   TRIG 77 03/01/2021   CHOLHDL 2.9 03/01/2021    Lab Results  Component Value Date   HGBA1C 5.4 03/01/2021   Recent Labs    02/28/24 0550  TSH 2.828  FREET4 0.95   Recent Labs    02/27/24 1210  VITAMINB12 142*   ------------------------------------------------------------------------------------------------------------------ Cardiac Enzymes No results for input(s): CKMB, TROPONINI, MYOGLOBIN in the last 168 hours.  Invalid input(s): CK  Micro Results No results found for this or any previous visit (from the past 240 hours).  Radiology Reports  No results found.     Signature  -   Lavada Stank M.D on 03/01/2024 at 7:58 AM   -  To page go to [email or web address]

## 2024-03-01 NOTE — TOC Progression Note (Signed)
 Transition of Care [facility]) - Progression Note    Patient Details  Name: Karla Richards MRN: [id number] Date of Birth: 06-Jul-1946  Transition of [facility] CM/SW Contact  Inocente GORMAN Kindle, LCSW Phone Number: 03/01/2024, 3:16 PM  Clinical Narrative:    CSW received request to fax patient out to [facility]. CSW requested RN obtain COVID test per [facility]U request. Faxed to the following:  -[facility]   -[facility]   -[facility]   -[facility]   -[facility]   -[facility]   -[facility]   -[place]      Expected Discharge Plan: Psychiatric [facility] Barriers to Discharge: Psych Bed not available  Expected Discharge Plan and Services         Expected Discharge Date: 02/29/24                                     Social Determinants of Health (SDOH) Interventions SDOH Screenings   Food Insecurity: No Food Insecurity (02/27/2024)  Housing: Low Risk  (02/27/2024)  Transportation Needs: No Transportation Needs (02/27/2024)  Utilities: Not At Risk (02/27/2024)  Social Connections: Unknown (02/29/2024)  Tobacco Use: Low Risk  (02/16/2024)    Readmission Risk Interventions     No data to display

## 2024-03-01 NOTE — Consult Note (Signed)
 [facility] Psychiatric Consult Follow-Up  Patient Name: .Karla Richards  MRN: [id number]  DOB: 11-Mar-1946  Consult Order details:  Orders (From admission, onward)     Start     Ordered   02/27/24 0848  IP CONSULT TO PSYCHIATRY       Ordering Provider: Dana Ivan Standing, MD  Provider:  (Not yet assigned)  Question Answer Comment  Location [facility]   Reason for Consult? paranoia, agitation, being followed by neuro who have nothing further to suggest, they reccomend psych assistance thanks      02/27/24 0848             Mode of Visit: In person    Psychiatry Consult Evaluation  Service Date: March 01, 2024 LOS:  LOS: 4 days  Chief Complaint Acute encephalopathy  Primary Psychiatric Diagnoses  Cognitive impairment, delirium versus dementia with behavioral disturbance Anxiety  Assessment  Karla Richards is a 78 y.o. female presented to the ED on 02/25/2024 at 9:19 PM for acute encephalopathy. She has no current or past psychiatric diagnoses and has a past medical history of possible dementia, HTN, CKD 3a, and hypothyroidism. Please see today's updates in red.  Her current presentation of altered mental status is most consistent with major neurocognitive disorder (dementia). Her progressive decline in baseline function over the past 18 months coupled with more recent paranoid delusions, ideas of reference, and visual hallucinations are most consistent with this diagnosis.  Dementia with Lewy bodies is a consideration based on family history (father had this disease).  Primary psychotic disorder cannot be ruled out at this point but seems unlikely based on the above. Also on the differential are delirium or delirium-on-dementia secondary to to-be-determined medical cause (ex. UTI). Current outpatient psychotropic medications include hydroxyzine  12.5 mg PRN for anxiety and she has responded well to this medication in past ED visits. On  initial examination, patient was oriented to person, place, and time but not situation. She declined further participation in the interview, hiding under sheets and signalling time out to her husband repeatedly. Please see plan below for detailed recommendations.   7/15:  This morning, patient was alone without her husband at bedside. Presentation was similar to yesterday with very limited participation in the mental status exam.  We would like to trial more Ativan  because there is some concern for catatonic features and the patient has had success with this medication previously.  However, we should note that the patient received 1 mg of Ativan  this morning and it does not appear to have improved her condition.  Attempted to contact the husband several times to discuss scheduling more Ativan .  Unable to be reached.  We will follow-up tomorrow.  Called daughter and discussed geriatric psychiatry resources in the area.  Information put into the AVS.  7/16: On assessment today, patient engages minimally and exhibits odd behavior.  For instance, when asked her name, she pauses for several seconds and starts to spell out her first name M-A-R and then trails off.  This behavior is similar to assessment over the past 2 days.  Plan for treatment with Ativan  for possible catatonic features.  Primary medical team says the patient is medically ready for discharge.  Will discuss with admissions coordinator for possible [facility] psych admission.  7/17: Much more alert and talkative on assessment twice today.  Thought content is illogical.  The improvement could be natural fluctuation in mental status but also could be delayed response to Ativan  administered yesterday.  Ativan   scheduled for 4 times daily going forward.  We will assess response tomorrow.  Called the husband who was unable to be reached.  Called and updated the daughter at the number below.  She feels her mother has improved.  We did discuss that the  patient may not be accepted to a geriatric psychiatry unit.  She expressed her understanding and said they can make arrangements for her to come home should that not be possible.  Diagnoses:  Active Hospital problems: Principal Problem:   Acute metabolic encephalopathy    Plan   ## Psychiatric Medication Recommendations: -- Ativan  1 mg 4 times daily for catatonia and anxiety  ## Medical Decision Making Capacity: Not specifically addressed in this encounter.  ## Further Work-up:  -- U/A -- most recent EKG on 02/26/2024 had QtC of 424 -- Pertinent labwork reviewed earlier this admission includes:    -Notable for CMP (Na 133, K 3.1), TSH (5.9), and free T4  -CBC, UDS unremarkable  ## Disposition:--Recommend inpatient psychiatric hospitalization for treatment of catatonia and psychosis.   This may not be possible, in which case it could make sense in the future for the patient to be discharged home with her family.  She has excellent social support.  ## Behavioral / Environmental: -Delirium Precautions: Delirium Interventions for Nursing and Staff: - RN to open blinds every AM. - To Bedside: Glasses, hearing aide, and pt's own shoes. Make available to patients. when possible and encourage use. - Encourage po fluids when appropriate, keep fluids within reach. - OOB to chair with meals. - Passive ROM exercises to all extremities with AM & PM care. - RN to assess orientation to person, time and place QAM and PRN. - Recommend extended visitation hours with familiar family/friends as feasible. - Staff to minimize disturbances at night. Turn off television when pt asleep or when not in use.   ## Safety and Observation Level:  - Based on my clinical evaluation, I estimate the patient to be at low risk of self harm in the current setting. - At this time, we recommend routine monitoring. This decision is based on my review of the chart including patient's history and current presentation, interview of  the patient, mental status examination, and consideration of suicide risk including evaluating suicidal ideation, plan, intent, suicidal or self-harm behaviors, risk factors, and protective factors. This judgment is based on our ability to directly address suicide risk, implement suicide prevention strategies, and develop a safety plan while the patient is in the clinical setting. Please contact our team if there is a concern that risk level has changed.  CSSR Risk Category:C-SSRS RISK CATEGORY: No Risk  Suicide Risk Assessment: Patient has following modifiable risk factors for suicide: none. Patient has following non-modifiable or demographic risk factors for suicide: none. Patient has the following protective factors against suicide: Access to outpatient mental health care, Supportive family, Supportive friends, no history of suicide attempts, and no history of NSSIB  Thank you for this consult request. Recommendations have been communicated to the primary team.  We will follow at this time.   Karleen Kaufmann, MD       History of Present Illness  Relevant Aspects of Hospital Course:  7/15: Overnight, nursing notes shared that patient was paranoid (ex. asking husband if the team knew she was Muslim) and refused PO medications. She received Zyprexa  2.5 mg x 1 and Ativan  1 mg IV x 2 (7/14 1233 and 7/15 0917).  7/14: Admitted on 02/25/2024 for acute encephalopathy.  Husband called EMS when he found his wife awake overnight, sleeping during the day, non-communicative, and not moving spontaneously. EMS reported rigidity and left facial gaze preference and code stroke was called. Labs notable for CMP (Na 133, K 3.1) and TSH (5.9). CBC, ethanol, UDS, CT head, and EEG were unremarkable.   Patient Report:  7/15: Presentation was similar to yesterday with paranoid delusions and very limited participation in the mental status exam. We introduced ourselves and asked orientation questions, to which the patient  responded, Did you bring the whole wide world? Thinking she was referring to too many providers in the room (three), we offered to have some step out, to which she responded unintelligibly. She refused to squeeze the resident's fingers, looking suddenly upset, closing her eyes tightly, and hiding her hands under the covers. When offered to be left alone, she nodded.  7/14: nursing reported that the patient expressed paranoid delusions, spoke nonsensically, and refused all medications.  On approach, patient is lying in bed with husband at bedside.  Patient is able to answer orientation questions, however does not participate further in the interview.  During the interview,, she put a blanket over her head and does not engage with the consult team.  At times she makes signals to her husband.  Her husband provides the majority of the history of present illness and notes that patient has been experiencing both visual hallucinations, paranoia, bizarre behavior that has worsened acutely over the last 3 weeks.  He notes over the past year and a half deficits in patient's cognition.  Psych ROS:  Depression: husband endorses anhedonia, sleep disturbance Anxiety: husband endorses worrying about current events (formerly consolable but increasingly less so), hyperventilation Mania (lifetime and current): none Psychosis: (lifetime and current): visual hallucinations (man entering bedroom), ideas of reference (president personally threatening her endeavors), paranoid delusions  Collateral information:  7/15: Called [facility] to share local geriatric psychiatry resources per her request.  Spoke with husband, Mr. Trudy, at bedside.  Called patient's daughter, Daune Gist ([phone]), who is a psychiatric nurse. When Daune saw the patient three weeks ago, she reports that she was alert, coherent, and seemed like herself. She also reports that Mr. Trudy reports that the patient had falls, urinary incontinence,  sleep disturbances, bizarre behavior, incoherent/rambling speech, and paranoid thoughts since February. Given this longer timeline, Ms. Gist suspects this clinical picture aligns with dementia but also thinks it could be something mental or psychiatric.   Psychiatric and Social History  Psychiatric History:  Information collected from husband and chart review.  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, published book on education, husband reports that she was recently working on another series of Insurance risk surveyor Hx: none Living Situation: lives with husband at home Spiritual Hx: Muslim Access to weapons/lethal means: denies   Substance History Alcohol: none reported  Type of alcohol none reported Last Drink none reported Number of drinks per day none reported History of alcohol withdrawal seizures none History of DT's none Tobacco: none  reported Illicit drugs: none reported Prescription drug abuse: none reported Rehab hx: none reported  Exam Findings  Vital Signs:  Temp:  [96.7 F (35.9 C)-98 F (36.7 C)] 98 F (36.7 C) (07/17 1306) Pulse Rate:  [67-103] 84 (07/17 1306) Resp:  [16-20] 20 (07/17 1306) BP: (93-175)/(55-87) 143/65 (07/17 1306) SpO2:  [94 %-98 %] 95 % (07/17 1306) Blood pressure (!) 143/65, pulse 84, temperature 98 F (36.7 C), temperature source Oral, resp. rate 20, height 4' 10.5 (1.486 m), weight 42.6 kg, SpO2 95%. Body mass  index is 19.29 kg/m.  Physical Exam Constitutional:      Appearance: the patient is not toxic-appearing.  Pulmonary:     Effort: Pulmonary effort is normal.  Neurological:     General: No focal deficit present.     Mental Status: the patient is alert   Review of Systems  Respiratory:  Negative for shortness of breath.   Cardiovascular:  Negative for chest pain.  Gastrointestinal:  Negative for abdominal pain, constipation, diarrhea, nausea and vomiting.  Neurological:  Negative for headaches.    Mental Status Exam: General Appearance: Appears stated age  Orientation: Partially oriented  Memory:  unable to assess  Concentration:  unable to assess  Recall:  unable to assess  Attention  unable to assess  Eye Contact:  Minimal  Speech: Appropriate  Language:  Poor  Volume:  Normal  Mood: none provided  Affect:  Blunt, Flat, and Restricted  Thought Process:  NA  Thought Content: Religious preoccupation  Suicidal Thoughts:  No  Homicidal Thoughts:  No  Judgement:  Impaired  Insight:  NA  Psychomotor Activity:  Normal  Akathisia:  No  Fund of Knowledge:  NA      Assets:  Health and safety inspector Housing Physical Health Social Support Vocational/Educational  Cognition: some degree of cognitive impairment likely based on history but unclear based on interview  ADL's:  Impaired  AIMS (if indicated):        Other History   These have been pulled in through the EMR, reviewed, and updated if appropriate.  Family History:  The patient's family history includes Colon cancer in her maternal uncle; Heart attack (age of onset: 53) in her mother; Leukemia in her sister; Rectal cancer in her maternal uncle; Stomach cancer in her sister; Stroke (age of onset: 58) in her maternal grandmother.  Medical History: Past Medical History:  Diagnosis Date  . Arthritis    hands, but reports that she is still active   . Cancer (HCC) 2019   skin cancer  . Complication of  anesthesia    states 44 yrs. ago she was given Seconal for childbirth  & she was passed out & felt like she was on fire  . Coronary artery disease   . Genital herpes   . Glaucoma    bilateral   . Hearing difficulty    bilateral, Left worse than R , no aids yet   . Hypertension   . Osteoporosis   . Sleep apnea    tested negative but wakes up gasping for air in her sleep; does not qualify for CPAP    Surgical History: Past Surgical History:  Procedure Laterality Date  . CESAREAN SECTION  1970, '72, '79  . coronary catherization    . MOUTH SURGERY    . TRABECULECTOMY Left 03/26/2015   Procedure: TRABECULECTOMY WITH [facility] ON THE LEFT EYE;  Surgeon: Gaither Quan, MD;  Location: [facility] OR;  Service: Ophthalmology;  Laterality: Left;  . TUBAL  LIGATION       Medications:   Current Facility-Administered Medications:  .  [START ON 03/02/2024] cyanocobalamin  (VITAMIN B12) tablet 1,000 mcg, 1,000 mcg, Oral, Daily, Dennise, Prashant K, MD .  diphenhydrAMINE  (BENADRYL ) injection 50 mg, 50 mg, Intravenous, Q8H PRN, Sundil, Subrina, MD .  enoxaparin  (LOVENOX ) injection 30 mg, 30 mg, Subcutaneous, Daily, Georgina Basket, MD, 30 mg at 03/01/24 0900 .  hydrALAZINE  (APRESOLINE ) injection 10 mg, 10 mg, Intravenous, Q4H PRN, Singh, Prashant K, MD, 10 mg at 02/29/24 0911 .  hydrALAZINE  (APRESOLINE ) tablet 25 mg, 25 mg, Oral, Q8H, Singh, Prashant K, MD, 25 mg at 03/01/24 1303 .  levothyroxine  (SYNTHROID ) tablet 25 mcg, 25 mcg, Oral, QAC breakfast, Georgina Basket, MD, 25 mcg at 03/01/24 0859 .  LORazepam  (ATIVAN ) tablet 1 mg, 1 mg, Oral, Q4H PRN **OR** LORazepam  (ATIVAN ) injection 1 mg, 1 mg, Intravenous, Q4H PRN, Chatterjee, Srobona Tublu, MD, 1 mg at 02/28/24 0917 .  LORazepam  (ATIVAN ) tablet 1 mg, 1 mg, Oral, QID, Stevens, Briana N, MD, 1 mg at 03/01/24 1303 .  losartan  (COZAAR ) tablet 50 mg, 50 mg, Oral, Daily, Singh, Prashant K, MD, 50 mg at 03/01/24 0859 .  metoprolol  tartrate (LOPRESSOR ) injection 2.5 mg,  2.5 mg, Intravenous, Q8H PRN, Sundil, Subrina, MD .  metoprolol  tartrate (LOPRESSOR ) tablet 25 mg, 25 mg, Oral, BID, Singh, Prashant K, MD, 25 mg at 03/01/24 0859 .  pantoprazole  (PROTONIX ) EC tablet 40 mg, 40 mg, Oral, Daily, Georgina Basket, MD, 40 mg at 03/01/24 9140  Facility-Administered Medications Ordered in Other Encounters:  .  mitoMYcin  (MUTAMYCIN ) Injection Use in OR only (0.4 mg/ml), 0.5 mL, Left Eye, Once, Cyrus Carwin, MD  Allergies: Allergies  Allergen Reactions  . Fish Allergy Anaphylaxis  . Food Anaphylaxis    All tree nuts - anaphylaxis   . Mangifera Indica Fruit Ext (Mango) [Mangifera Indica] Anaphylaxis  . Peanuts [Peanut Oil] Anaphylaxis  . Penicillins Anaphylaxis  . Shellfish Allergy Swelling  . Altafluor Benox [Fluorescein -Benoxinate] Itching, Swelling and Other (See Comments)    Eye irritation  . Cipro [Ciprofloxacin Hcl] Other (See Comments)    Unknown reaction  OK to take tablet formulation - reaction only to eye drops  . Coconut (Cocos Nucifera) Other (See Comments)    Unknown reaction  Reaction to the fruit and oil  . Seconal [Secobarbital Sodium] Other (See Comments)    Syncope  Skin burning  . Turmeric Other (See Comments)    Unknown reaction  . Ace Inhibitors Hives  . Alcohol-Sulfur [Elemental Sulfur] Other (See Comments)    Excessive drowsiness   . Alphagan  [Brimonidine ] Itching, Swelling and Other (See Comments)    Eye irritation  . Bayer Aspirin  Elex.Earing ] Other (See Comments)    Stomach irritation  . Beta Adrenergic Blockers Hives  . Caffeine Anxiety and Other (See Comments)    Jittery  . Iodine Solution [Povidone Iodine] Itching    Reaction to topical solution  . Latex Itching  . Milk-Related Compounds Other (See Comments)    Vertigo  . Motrin [Ibuprofen] Other (See Comments)    Stomach irritation  . Norvasc [Amlodipine] Hives and Swelling  . Sulfa Antibiotics Other (See Comments)    Unknown reaction  OK to take with Benadryl      Karleen Kaufmann, MD PGY-4

## 2024-03-02 ENCOUNTER — Other Ambulatory Visit (HOSPITAL_COMMUNITY): Payer: Self-pay

## 2024-03-02 DIAGNOSIS — G9341 Metabolic encephalopathy: Secondary | ICD-10-CM | POA: Diagnosis not present

## 2024-03-02 MED ORDER — HYDRALAZINE HCL 50 MG PO TABS
50.0000 mg | ORAL_TABLET | Freq: Three times a day (TID) | ORAL | 0 refills | Status: AC
  Filled 2024-03-02 (×2): qty 90, 30d supply, fill #0

## 2024-03-02 MED ORDER — LORAZEPAM 1 MG PO TABS
1.0000 mg | ORAL_TABLET | Freq: Three times a day (TID) | ORAL | 0 refills | Status: AC | PRN
  Filled 2024-03-02: qty 30, 10d supply, fill #0

## 2024-03-02 MED ORDER — LEVOTHYROXINE SODIUM 50 MCG PO TABS
50.0000 ug | ORAL_TABLET | Freq: Every day | ORAL | Status: DC
  Administered 2024-03-02: 50 ug via ORAL
  Filled 2024-03-02: qty 1

## 2024-03-02 NOTE — TOC Transition Note (Addendum)
 Transition of Care [facility]) - Discharge Note   Patient Details  Name: Jenan Ellegood MRN: [id number] Date of Birth: 1946/02/10  [facility] CM/SW Contact:  Corean JAYSON Canary, RN Phone Number: 03/02/2024, 11:47 AM   Clinical Narrative:    Discussed in ID progressive rounds, the patient will be going home with family. [facility] will be following for Home health, orders are in. Amy from [place] notified of discharge. They have a psychiatry appt scheduled in August. [facility] [facility] stated they will see the patient tomorrow No further  needs identified    Final next level of care: Home w Home Health Services Barriers to Discharge: No Barriers Identified   Patient Goals and CMS Choice            Discharge Placement                       Discharge Plan and Services Additional resources added to the After Visit Summary for                                       Social Drivers of Health (SDOH) Interventions SDOH Screenings   Food Insecurity: No Food Insecurity (02/27/2024)  Housing: Low Risk  (02/27/2024)  Transportation Needs: No Transportation Needs (02/27/2024)  Utilities: Not At Risk (02/27/2024)  Social Connections: Unknown (02/29/2024)  Tobacco Use: Low Risk  (02/16/2024)     Readmission Risk Interventions     No data to display

## 2024-03-02 NOTE — Progress Notes (Signed)
 Physical Therapy Treatment Patient Details Name: Karla Richards MRN: [id number] DOB: 04-28-1946 Today's Date: 03/02/2024   History of Present Illness Pt is a 78 y/o F admitted on 02/25/24 after presenting with decreased responsiveness/AMS. In ED, pt initially found to have hypertensive urgency, hyponatremia. Pt is being treated for metabolic encephalopathy vs dementia, paranoia/anxiety. PMH: ?dementia, CAD, HTN, hypothyroidism, GERD, anxiety/panic attacks    PT Comments  Pt tolerated treatment well today. Pt today was able to progress ambulation in hallway with RW CGA/Min A. Pt tended to veer to the R when ambulating and need Min A to navigate RW. No change in DC/DME recs at this time. PT will continue to follow.     If plan is discharge home, recommend the following: A lot of help with walking and/or transfers;A lot of help with bathing/dressing/bathroom;Direct supervision/assist for medications management;Assistance with cooking/housework;Direct supervision/assist for financial management;Assistance with feeding;Supervision due to cognitive status   Can travel by private vehicle     No  Equipment Recommendations  Other (comment)    Recommendations for Other Services       Precautions / Restrictions Precautions Precautions: Fall Recall of Precautions/Restrictions: Intact Restrictions Weight Bearing Restrictions Per Provider Order: No     Mobility  Bed Mobility Overal bed mobility: Needs Assistance Bed Mobility: Supine to Sit, Sit to Supine     Supine to sit: Contact guard Sit to supine: Min assist   General bed mobility comments: CGA with increased time and cueing. Min A to return to bed.    Transfers Overall transfer level: Needs assistance Equipment used: Rolling walker (2 wheels) Transfers: Sit to/from Stand Sit to Stand: Contact guard assist           General transfer comment: CGA with RW    Ambulation/Gait Ambulation/Gait assistance: Contact guard  assist, Min assist Gait Distance (Feet): 250 Feet Assistive device: Rolling walker (2 wheels) Gait Pattern/deviations: Step-through pattern, Staggering right, Drifts right/left, Decreased stride length       General Gait Details: pt veering R especially with challenge (head turns, 90 deg turns), occasionally requiring light assist to steady but otherwise pt-corrected. Needs Min A for obstacle navigation.   Stairs             Wheelchair Mobility     Tilt Bed    Modified Rankin (Stroke Patients Only)       Balance Overall balance assessment: Needs assistance Sitting-balance support: Feet supported Sitting balance-Leahy Scale: Fair Sitting balance - Comments: EOB ADLs   Standing balance support: Single extremity supported, During functional activity Standing balance-Leahy Scale: Fair Standing balance comment: Pt ambulated 60 feet, at some points only using 1 hand on RW                            Communication Communication Communication: No apparent difficulties  Cognition Arousal: Alert Behavior During Therapy: Flat affect                             Following commands: Impaired Following commands impaired: Follows one step commands with increased time    Cueing Cueing Techniques: Verbal cues, Gestural cues  Exercises      General Comments General comments (skin integrity, edema, etc.): VSS      Pertinent Vitals/Pain Pain Assessment Pain Assessment: No/denies pain    Home Living  Prior Function            PT Goals (current goals can now be found in the care plan section) Progress towards PT goals: Progressing toward goals    Frequency    Min 2X/week      PT Plan      Co-evaluation              AM-PAC PT 6 Clicks Mobility   Outcome Measure  Help needed turning from your back to your side while in a flat bed without using bedrails?: A Little Help needed moving from lying  on your back to sitting on the side of a flat bed without using bedrails?: A Little Help needed moving to and from a bed to a chair (including a wheelchair)?: A Lot Help needed standing up from a chair using your arms (e.g., wheelchair or bedside chair)?: A Lot Help needed to walk in hospital room?: Total Help needed climbing 3-5 steps with a railing? : Total 6 Click Score: 12    End of Session Equipment Utilized During Treatment: Gait belt Activity Tolerance: Patient tolerated treatment well Patient left: in bed;with call bell/phone within reach;with bed alarm set Nurse Communication: Mobility status PT Visit Diagnosis: Other abnormalities of gait and mobility (R26.89)     Time: 8668-8652 PT Time Calculation (min) (ACUTE ONLY): 16 min  Charges:    $Gait Training: 8-22 mins PT General Charges $$ ACUTE PT VISIT: 1 Visit                     Sueellen NOVAK, PT, DPT Acute Rehab Services [phone]    [name] 03/02/2024, 3:43 PM

## 2024-03-02 NOTE — Progress Notes (Signed)
 PROGRESS NOTE    Karla Richards  FMW:[id number]  DOB: 1945/08/18  DOA: 02/25/2024 PCP: Benjamine Aland, MD Outpatient Specialists:   Hospital course:   78 y.o. female with medical history significant of ?dementia, CAD, HTN, hypothyroidism, GERD, anxiety/panic attacks with several recent visits to ED with concern for altered mental status and stroke.  Extensive stroke workup has been negative.  Patient was brought in yesterday for decreased responsiveness/altered mental status, her husband was unable to arouse her.  Workup in ED was notable for hypertensive urgency, hyponatremia and initial decreased consciousness however woke up quickly after coming to the ED.  After she woke up however patient was noted to be extremely paranoid, noncooperative but no focal deficits.  Patient was seen by neurology and head imaging was unrevealing.  Psychiatry consultation was recommended.  Of note, pt was recently evaluated by her PCP who started her on citalopram  3 days ago. Husband denies any history of mental health issues, but noted that she was previously treated for anxiety/depression related to her last marriage. Additionally, during her last admission on 02/10/24 there was some concern for dementia and OP neurocognitive testing was recommended.  Per psychiatric note from 714: Called patient's daughter, Daune Gist ([phone]), who is a psychiatric nurse. When Daune saw the patient three weeks ago, she reports that she was alert, coherent, and seemed like herself. She also reports that Mr. Trudy reports that the patient had falls, urinary incontinence, sleep disturbances, bizarre behavior, incoherent/rambling speech, and paranoid thoughts since February.   Subjective:  In bed trying to eat breakfast, denies any headache or chest pain.  No shortness of breath.  Objective: Vitals:   03/01/24 2146 03/01/24 2337 03/02/24 0513 03/02/24 0753  BP: 133/84 (!) 118/58 (!) 145/77 (!) 121/53   Pulse:  (!) 56 62 65  Resp:  16 19 19   Temp:  98.2 F (36.8 C) 97.6 F (36.4 C) (!) 97.5 F (36.4 C)  TempSrc:  Axillary Axillary Axillary  SpO2:  98% 99% 100%  Weight:      Height:        Intake/Output Summary (Last 24 hours) at 03/02/2024 0943 Last data filed at 03/02/2024 0500 Gross per 24 hour  Intake --  Output 400 ml  Net -400 ml   Filed Weights   02/25/24 2100 02/25/24 2144 02/27/24 1148  Weight: 42.6 kg 42.6 kg 42.6 kg     Exam:  Awake Alert, No new F.N deficits, anxious affect, answered a few questions today .AT,PERRAL Supple Neck, No JVD,   Symmetrical Chest wall movement, Good air movement bilaterally, CTAB RRR,No Gallops, Rubs or new Murmurs,  +ve B.Sounds, Abd Soft, No tenderness,   No Cyanosis, Clubbing or edema     Assessment & Plan:   Altered mental status Metabolic encephalopathy vs dementia Paranoia/anxiety Very much appreciate both neurology and psychiatry input  Patient has had extensive neurowork-up at previous hospitalization, no further inpatient workup warranted per neurology notes.  If desired outpatient workup can continue Case discussed with Dr. Matthews on 02/29/2024. Seen by psychiatry, initially family was leaning towards discharging the patient and taking her home however subsequently they changed her mind and wanted her symptoms to be better controlled prior to them taking her to home, psych now thinks patient will be appropriate for a Geri psych bed.  She is currently on benzodiazepines for possible questionable catatonia.    She has been seen both by neurology and psychiatry.  Per psych she has been started on Ativan  for possible  catatonia with some improvement, family still contemplating between home and [facility] psych bed.     Hyponatremia - AKI - Improved after hydration  Hypothyroidism  Continue Synthroid , continue home Synthroid   Hypertensive urgency - HTN  When compliant with blood pressure medications blood pressure is stable,  blood pressure medications adjusted further on 03/01/2024, has multiple drug allergies limiting our treatment choices.  Vitamin B12 deficiency.  Placed on replacement.    DVT prophylaxis: Lovenox  Code Status: Full Family Communication: Husband Lupita [phone]  over the phone on 02/28/2024, message left 02/29/2024,  daughter Arland over the phone [phone] on 02/29/2024, husband and daughter Arland along with multiple family members bedside again later on 02/29/2024, called husband called on 03/02/2024 between 8 and 9:30 AM x 3, messages left on cell phone x 3.    Data Review:   Inpatient Medications  Scheduled Meds:  vitamin B-12  1,000 mcg Oral Daily   enoxaparin  (LOVENOX ) injection  30 mg Subcutaneous Daily   hydrALAZINE   25 mg Oral Q8H   levothyroxine   50 mcg Oral QAC breakfast   LORazepam   1 mg Oral QID   losartan   50 mg Oral Daily   pantoprazole   40 mg Oral Daily   Continuous Infusions:   PRN Meds:.hydrALAZINE , LORazepam  **OR** LORazepam , metoprolol  tartrate  DVT Prophylaxis  enoxaparin  (LOVENOX ) injection 30 mg Start: 02/26/24 1000       Recent Labs  Lab 02/25/24 2122 02/25/24 2131 02/28/24 0550  WBC 8.1  --  4.9  HGB 13.2 13.9 11.8*  HCT 38.5 41.0 35.2*  PLT 291  --  209  MCV 87.5  --  90.3  MCH 30.0  --  30.3  MCHC 34.3  --  33.5  RDW 13.8  --  14.3  LYMPHSABS 0.9  --   --   MONOABS 0.8  --   --   EOSABS 0.0  --   --   BASOSABS 0.0  --   --     Recent Labs  Lab 02/25/24 2122 02/25/24 2131 02/26/24 1350 02/27/24 0426 02/28/24 0550  NA 126* 129*  --  133* 135  K 3.0* 3.1*  --  4.7 3.8  CL 94* 96*  --  107 104  CO2 19*  --   --  21* 23  ANIONGAP 13  --   --  5 8  GLUCOSE 147* 149*  --  93 78  BUN 9 7*  --  15 17  CREATININE 0.88 0.80  --  1.23* 1.07*  AST 31  --   --   --  38  ALT 16  --   --   --  23  ALKPHOS 60  --   --   --  46  BILITOT 0.8  --   --   --  0.5  ALBUMIN 4.0  --   --   --  3.4*  PROCALCITON  --   --  <0.10  --   --   INR  1.0  --   --   --   --   TSH  --  5.936*  --   --  2.828  MG  --  1.9  --   --  1.9  PHOS  --   --   --   --  3.7  CALCIUM  9.4  --   --  8.7* 9.4      Recent Labs  Lab 02/25/24 2122 02/25/24 2131 02/26/24 1350 02/27/24 0426 02/28/24 0550  PROCALCITON  --   --  <  0.10  --   --   INR 1.0  --   --   --   --   TSH  --  5.936*  --   --  2.828  MG  --  1.9  --   --  1.9  CALCIUM  9.4  --   --  8.7* 9.4    --------------------------------------------------------------------------------------------------------------- Lab Results  Component Value Date   CHOL 184 03/01/2021   HDL 64 03/01/2021   LDLCALC 105 (H) 03/01/2021   TRIG 77 03/01/2021   CHOLHDL 2.9 03/01/2021    Lab Results  Component Value Date   HGBA1C 5.4 03/01/2021   No results for input(s): TSH, T4TOTAL, FREET4, T3FREE, THYROIDAB in the last 72 hours.  No results for input(s): VITAMINB12, FOLATE, FERRITIN, TIBC, IRON, RETICCTPCT in the last 72 hours.  ------------------------------------------------------------------------------------------------------------------ Cardiac Enzymes No results for input(s): CKMB, TROPONINI, MYOGLOBIN in the last 168 hours.  Invalid input(s): CK  Micro Results Recent Results (from the past 240 hours)  SARS Coronavirus 2 by RT PCR (hospital order, performed in [facility] hospital lab) *cepheid single result test* Anterior Nasal Swab     Status: None   Collection Time: 03/01/24  3:17 PM   Specimen: Anterior Nasal Swab  Result Value Ref Range Status   SARS Coronavirus 2 by RT PCR NEGATIVE NEGATIVE Final    Comment: Performed at [facility], [address] [place], [place] [address]    Radiology Reports  No results found.     Signature  -   Lavada Stank M.D on 03/02/2024 at 9:43 AM   -  To page go to [email or web address]

## 2024-03-02 NOTE — Plan of Care (Signed)

## 2024-03-02 NOTE — Plan of Care (Signed)
  Problem: Clinical Measurements: Goal: Will remain free from infection Outcome: Progressing Goal: Diagnostic test results will improve Outcome: Progressing   Problem: Activity: Goal: Risk for activity intolerance will decrease Outcome: Progressing   Problem: Nutrition: Goal: Adequate nutrition will be maintained Outcome: Progressing   Problem: Coping: Goal: Level of anxiety will decrease Outcome: Progressing   Problem: Safety: Goal: Ability to remain free from injury will improve Outcome: Progressing

## 2024-03-02 NOTE — Consult Note (Signed)
 Brief psych note 7/18  Pt is psychiatrically stable for discharge with close follow up with family and follow up with outpatient psychiatry.   Plan Continue Ativan  1 mg once every four hours as needed for paranoia / agitation Follow up with Dr. Tasia [place] psych, on 8/19 1430 [facility]) With acute concerns Walk in clinic info provided in discharge Behavioral health urgent care 24/7 provided

## 2024-03-05 ENCOUNTER — Telehealth: Payer: Self-pay

## 2024-03-05 NOTE — Transitions of Care (Post Inpatient/ED Visit) (Signed)
   03/05/2024  Name: Karla Richards MRN: [id number] DOB: [birth year]  Today's TOC FU Call Status: Today's TOC FU Call Status:: Unsuccessful Call (1st Attempt) Unsuccessful Call (1st Attempt) Date: 03/05/24  Attempted to reach the patient regarding the most recent Inpatient/ED visit. Left a HIPAA approved voicemail message to phone number provided in demographics per DPR.    Follow Up Plan: Additional outreach attempts will be made to reach the patient to complete the Transitions of Care (Post Inpatient/ED visit) call.   Richerd Fish, RN, BSN, CCM [facility], [facility] RN Care Manager Direct Dial: [phone]

## 2024-03-06 ENCOUNTER — Telehealth: Payer: Self-pay

## 2024-03-06 ENCOUNTER — Telehealth: Admitting: Family Medicine

## 2024-03-06 NOTE — Transitions of Care (Post Inpatient/ED Visit) (Signed)
   03/06/2024  Name: Karla Richards MRN: [id number] DOB: 1946/07/07  Today's TOC FU Call Status: Today's TOC FU Call Status:: Unsuccessful Call (2nd Attempt) Unsuccessful Call (2nd Attempt) Date: 03/06/24  Attempted to reach the patient regarding the most recent Inpatient/ED visit. Left a HIPAA approved voicemail message to phone number provided in demographics per DPR.    Follow Up Plan: Additional outreach attempts will be made to reach the patient to complete the Transitions of Care (Post Inpatient/ED visit) call.   Richerd Fish, RN, BSN, CCM [facility], [facility] RN Care Manager Direct Dial: [phone]

## 2024-03-07 ENCOUNTER — Telehealth: Payer: Self-pay

## 2024-03-07 NOTE — Transitions of Care (Post Inpatient/ED Visit) (Signed)
   03/07/2024  Name: Karla Richards MRN: [id number] DOB: [birth year]  Today's TOC FU Call Status: Today's TOC FU Call Status:: Unsuccessful Call (3rd Attempt) Unsuccessful Call (3rd Attempt) Date: 03/07/24  Attempted to reach the patient regarding the most recent Inpatient/ED visit. Unable to leave voicemail message, voicemail box states full.  Follow Up Plan: No further outreach attempts will be made at this time. We have been unable to contact the patient.  Richerd Fish, RN, BSN, CCM [facility], [facility] RN Care Manager Direct Dial: [phone]

## 2024-03-14 ENCOUNTER — Ambulatory Visit: Admitting: Pulmonary Disease

## 2024-03-14 ENCOUNTER — Encounter: Payer: Self-pay | Admitting: Pulmonary Disease

## 2024-03-19 DIAGNOSIS — F329 Major depressive disorder, single episode, unspecified: Secondary | ICD-10-CM | POA: Diagnosis not present

## 2024-03-19 DIAGNOSIS — D510 Vitamin B12 deficiency anemia due to intrinsic factor deficiency: Secondary | ICD-10-CM | POA: Diagnosis not present

## 2024-03-19 DIAGNOSIS — I10 Essential (primary) hypertension: Secondary | ICD-10-CM | POA: Diagnosis not present

## 2024-03-19 DIAGNOSIS — N189 Chronic kidney disease, unspecified: Secondary | ICD-10-CM | POA: Diagnosis not present

## 2024-03-19 DIAGNOSIS — I129 Hypertensive chronic kidney disease with stage 1 through stage 4 chronic kidney disease, or unspecified chronic kidney disease: Secondary | ICD-10-CM | POA: Diagnosis not present

## 2024-03-19 DIAGNOSIS — E039 Hypothyroidism, unspecified: Secondary | ICD-10-CM | POA: Diagnosis not present

## 2024-03-19 DIAGNOSIS — I639 Cerebral infarction, unspecified: Secondary | ICD-10-CM | POA: Diagnosis not present

## 2024-03-27 ENCOUNTER — Encounter (HOSPITAL_COMMUNITY): Payer: Self-pay | Admitting: Psychiatry

## 2024-03-27 ENCOUNTER — Ambulatory Visit (HOSPITAL_BASED_OUTPATIENT_CLINIC_OR_DEPARTMENT_OTHER): Payer: Self-pay | Admitting: Psychiatry

## 2024-03-27 ENCOUNTER — Other Ambulatory Visit: Payer: Self-pay

## 2024-03-27 VITALS — BP 190/95 | HR 93 | Ht 59.0 in | Wt 92.0 lb

## 2024-03-27 DIAGNOSIS — F03918 Unspecified dementia, unspecified severity, with other behavioral disturbance: Secondary | ICD-10-CM

## 2024-03-27 MED ORDER — HYDROXYZINE PAMOATE 25 MG PO CAPS: ORAL_CAPSULE | ORAL | 0 refills | Status: AC

## 2024-03-27 MED ORDER — BREXPIPRAZOLE 1 MG PO TABS: ORAL_TABLET | ORAL | 3 refills | Status: AC
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
## 2024-03-28 ENCOUNTER — Ambulatory Visit (HOSPITAL_COMMUNITY): Payer: Self-pay | Admitting: Psychiatry

## 2024-04-10 ENCOUNTER — Encounter (HOSPITAL_COMMUNITY): Payer: Self-pay

## 2024-04-10 ENCOUNTER — Ambulatory Visit (HOSPITAL_COMMUNITY): Admitting: Psychiatry

## 2024-05-01 ENCOUNTER — Telehealth (HOSPITAL_COMMUNITY): Payer: Self-pay | Admitting: *Deleted

## 2024-05-01 NOTE — Telephone Encounter (Signed)
 PA for Rexulti  1 mg at bedtime submitted to Medicare via the CoverMyMeds portal. Awaiting determination.

## 2024-05-02 ENCOUNTER — Telehealth (HOSPITAL_COMMUNITY): Payer: Self-pay | Admitting: *Deleted

## 2024-05-02 NOTE — Telephone Encounter (Signed)
 PA for Rexulti  1 mg at bedtime has been submitted to [facility], and is Approved from 08/17/23 to 08/15/24.

## 2024-07-24 NOTE — Telephone Encounter (Signed)
 Archived in [employer] portal. Patient never started treatment.
